# Patient Record
Sex: Female | Born: 1935
Health system: Southern US, Community
[De-identification: ages and names within clinical notes are randomized; demographics above are authoritative.]

## PROBLEM LIST (undated history)

## (undated) DIAGNOSIS — I10 Essential (primary) hypertension: Secondary | ICD-10-CM

## (undated) DIAGNOSIS — R0600 Dyspnea, unspecified: Secondary | ICD-10-CM

## (undated) DIAGNOSIS — I5032 Chronic diastolic (congestive) heart failure: Secondary | ICD-10-CM

## (undated) DIAGNOSIS — I4891 Unspecified atrial fibrillation: Secondary | ICD-10-CM

## (undated) DIAGNOSIS — I340 Nonrheumatic mitral (valve) insufficiency: Secondary | ICD-10-CM

## (undated) DIAGNOSIS — E7800 Pure hypercholesterolemia, unspecified: Secondary | ICD-10-CM

## (undated) DIAGNOSIS — I509 Heart failure, unspecified: Secondary | ICD-10-CM

## (undated) DIAGNOSIS — N189 Chronic kidney disease, unspecified: Secondary | ICD-10-CM

## (undated) DIAGNOSIS — M1990 Unspecified osteoarthritis, unspecified site: Secondary | ICD-10-CM

## (undated) HISTORY — DX: Nonrheumatic mitral (valve) insufficiency: I34.0

## (undated) HISTORY — DX: Heart failure, unspecified: I50.9

## (undated) HISTORY — DX: Chronic diastolic (congestive) heart failure: I50.32

## (undated) HISTORY — PX: CATARACT EXTRACTION, BILATERAL: SHX1313

---

## 1982-06-15 HISTORY — PX: BREAST BIOPSY: SHX20

## 1997-12-24 ENCOUNTER — Ambulatory Visit (HOSPITAL_COMMUNITY): Admission: RE | Admit: 1997-12-24 | Discharge: 1997-12-24 | Payer: Self-pay | Admitting: Endocrinology

## 1998-03-20 ENCOUNTER — Other Ambulatory Visit: Admission: RE | Admit: 1998-03-20 | Discharge: 1998-03-20 | Payer: Self-pay | Admitting: Endocrinology

## 1998-09-02 ENCOUNTER — Ambulatory Visit (HOSPITAL_COMMUNITY): Admission: RE | Admit: 1998-09-02 | Discharge: 1998-09-02 | Payer: Self-pay | Admitting: *Deleted

## 1999-03-25 ENCOUNTER — Other Ambulatory Visit: Admission: RE | Admit: 1999-03-25 | Discharge: 1999-03-25 | Payer: Self-pay | Admitting: Endocrinology

## 2000-03-11 ENCOUNTER — Encounter: Payer: Self-pay | Admitting: Endocrinology

## 2000-03-11 ENCOUNTER — Encounter: Admission: RE | Admit: 2000-03-11 | Discharge: 2000-03-11 | Payer: Self-pay | Admitting: Endocrinology

## 2000-04-01 ENCOUNTER — Other Ambulatory Visit: Admission: RE | Admit: 2000-04-01 | Discharge: 2000-04-01 | Payer: Self-pay | Admitting: Endocrinology

## 2001-03-14 ENCOUNTER — Encounter: Admission: RE | Admit: 2001-03-14 | Discharge: 2001-03-14 | Payer: Self-pay | Admitting: Endocrinology

## 2001-03-14 ENCOUNTER — Encounter: Payer: Self-pay | Admitting: Endocrinology

## 2001-04-05 ENCOUNTER — Other Ambulatory Visit: Admission: RE | Admit: 2001-04-05 | Discharge: 2001-04-05 | Payer: Self-pay | Admitting: Endocrinology

## 2002-03-14 ENCOUNTER — Encounter: Payer: Self-pay | Admitting: Endocrinology

## 2002-03-14 ENCOUNTER — Encounter: Admission: RE | Admit: 2002-03-14 | Discharge: 2002-03-14 | Payer: Self-pay | Admitting: Endocrinology

## 2003-03-15 ENCOUNTER — Encounter: Admission: RE | Admit: 2003-03-15 | Discharge: 2003-03-15 | Payer: Self-pay | Admitting: Endocrinology

## 2003-03-15 ENCOUNTER — Encounter: Payer: Self-pay | Admitting: Endocrinology

## 2003-08-12 ENCOUNTER — Emergency Department (HOSPITAL_COMMUNITY): Admission: EM | Admit: 2003-08-12 | Discharge: 2003-08-12 | Payer: Self-pay | Admitting: Emergency Medicine

## 2004-03-18 ENCOUNTER — Ambulatory Visit (HOSPITAL_COMMUNITY): Admission: RE | Admit: 2004-03-18 | Discharge: 2004-03-18 | Payer: Self-pay | Admitting: Endocrinology

## 2005-03-24 ENCOUNTER — Ambulatory Visit (HOSPITAL_COMMUNITY): Admission: RE | Admit: 2005-03-24 | Discharge: 2005-03-24 | Payer: Self-pay | Admitting: Endocrinology

## 2006-03-25 ENCOUNTER — Ambulatory Visit (HOSPITAL_COMMUNITY): Admission: RE | Admit: 2006-03-25 | Discharge: 2006-03-25 | Payer: Self-pay | Admitting: Endocrinology

## 2006-05-31 ENCOUNTER — Emergency Department (HOSPITAL_COMMUNITY): Admission: EM | Admit: 2006-05-31 | Discharge: 2006-05-31 | Payer: Self-pay | Admitting: Emergency Medicine

## 2006-07-12 ENCOUNTER — Emergency Department (HOSPITAL_COMMUNITY): Admission: EM | Admit: 2006-07-12 | Discharge: 2006-07-12 | Payer: Self-pay | Admitting: Emergency Medicine

## 2007-03-29 ENCOUNTER — Ambulatory Visit (HOSPITAL_COMMUNITY): Admission: RE | Admit: 2007-03-29 | Discharge: 2007-03-29 | Payer: Self-pay | Admitting: Endocrinology

## 2008-02-27 ENCOUNTER — Other Ambulatory Visit: Admission: RE | Admit: 2008-02-27 | Discharge: 2008-02-27 | Payer: Self-pay | Admitting: Endocrinology

## 2008-04-03 ENCOUNTER — Ambulatory Visit (HOSPITAL_COMMUNITY): Admission: RE | Admit: 2008-04-03 | Discharge: 2008-04-03 | Payer: Self-pay | Admitting: Endocrinology

## 2009-04-04 ENCOUNTER — Ambulatory Visit (HOSPITAL_COMMUNITY): Admission: RE | Admit: 2009-04-04 | Discharge: 2009-04-04 | Payer: Self-pay | Admitting: Endocrinology

## 2010-02-08 ENCOUNTER — Emergency Department (HOSPITAL_COMMUNITY): Admission: EM | Admit: 2010-02-08 | Discharge: 2010-02-08 | Payer: Self-pay | Admitting: Emergency Medicine

## 2010-04-09 ENCOUNTER — Ambulatory Visit (HOSPITAL_COMMUNITY): Admission: RE | Admit: 2010-04-09 | Discharge: 2010-04-09 | Payer: Self-pay | Admitting: Endocrinology

## 2011-03-12 ENCOUNTER — Other Ambulatory Visit (HOSPITAL_COMMUNITY): Payer: Self-pay | Admitting: Endocrinology

## 2011-03-12 DIAGNOSIS — Z1231 Encounter for screening mammogram for malignant neoplasm of breast: Secondary | ICD-10-CM

## 2011-04-14 ENCOUNTER — Ambulatory Visit (HOSPITAL_COMMUNITY)
Admission: RE | Admit: 2011-04-14 | Discharge: 2011-04-14 | Disposition: A | Discharge status: Home / Self Care | Payer: Medicare Other | Source: Ambulatory Visit | Attending: Endocrinology | Admitting: Endocrinology

## 2011-04-14 DIAGNOSIS — Z1231 Encounter for screening mammogram for malignant neoplasm of breast: Secondary | ICD-10-CM | POA: Insufficient documentation

## 2011-05-03 ENCOUNTER — Emergency Department (HOSPITAL_COMMUNITY): Payer: Medicare Other

## 2011-05-03 ENCOUNTER — Emergency Department (HOSPITAL_COMMUNITY)
Admission: EM | Admit: 2011-05-03 | Discharge: 2011-05-03 | Disposition: A | Discharge status: Home / Self Care | Payer: Medicare Other | Attending: Emergency Medicine | Admitting: Emergency Medicine

## 2011-05-03 DIAGNOSIS — M79609 Pain in unspecified limb: Secondary | ICD-10-CM | POA: Insufficient documentation

## 2011-05-03 DIAGNOSIS — M1990 Unspecified osteoarthritis, unspecified site: Secondary | ICD-10-CM

## 2011-05-03 DIAGNOSIS — M129 Arthropathy, unspecified: Secondary | ICD-10-CM | POA: Insufficient documentation

## 2011-05-03 DIAGNOSIS — I499 Cardiac arrhythmia, unspecified: Secondary | ICD-10-CM | POA: Insufficient documentation

## 2011-05-03 DIAGNOSIS — M1A00X1 Idiopathic chronic gout, unspecified site, with tophus (tophi): Secondary | ICD-10-CM | POA: Insufficient documentation

## 2011-05-03 DIAGNOSIS — M25539 Pain in unspecified wrist: Secondary | ICD-10-CM | POA: Insufficient documentation

## 2011-05-03 DIAGNOSIS — M25569 Pain in unspecified knee: Secondary | ICD-10-CM | POA: Insufficient documentation

## 2011-05-03 HISTORY — DX: Pure hypercholesterolemia, unspecified: E78.00

## 2011-05-03 HISTORY — DX: Unspecified osteoarthritis, unspecified site: M19.90

## 2011-05-03 MED ORDER — NAPROXEN 500 MG PO TABS: 500.0000 mg | ORAL_TABLET | Freq: Two times a day (BID) | ORAL | Status: AC

## 2011-05-03 MED ORDER — OXYCODONE-ACETAMINOPHEN 5-325 MG PO TABS: 1.0000 | ORAL_TABLET | Freq: Four times a day (QID) | ORAL | Status: AC | PRN

## 2011-05-03 MED ORDER — HYDROMORPHONE HCL PF 2 MG/ML IJ SOLN
2.0000 mg | Freq: Once | INTRAMUSCULAR | Status: AC
  Administered 2011-05-03: 2 mg via INTRAMUSCULAR
  Filled 2011-05-03: qty 1

## 2011-05-03 MED ORDER — NAPROXEN 500 MG PO TABS
500.0000 mg | ORAL_TABLET | Freq: Once | ORAL | Status: AC
  Administered 2011-05-03: 500 mg via ORAL
  Filled 2011-05-03: qty 1

## 2011-05-03 NOTE — ED Notes (Signed)
Patient is alert and oriented x3.  She has been given DC instructions and MD follow up instructions. V/S stable.  She is being DC to home with family via wheelchair.

## 2011-05-03 NOTE — ED Provider Notes (Signed)
History     CSN: [id number] Arrival date & time: 05/03/2011  9:54 AM   First MD Initiated Contact with Patient 05/03/11 1013      Chief Complaint  Patient presents with  . Wrist Pain    pt in with right wrist and hand pain also states pain in bilateral knee states hx of gout and arthritis pt states no relief with medications denies recent injury  . Knee Pain  . Hand Pain    (Consider location/radiation/quality/duration/timing/severity/associated sxs/prior treatment) HPI Comments: Chest bilateral knee pain and right hand pain which is gradually gotten worse over the past several days. Has been taking NSAIDs and culture seen at home without relief. History of gout. Range of motion is limited secondary to pain. 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 Having difficulty with ambulation secondary to pain  Patient is a 77 y.o. female presenting with knee pain. The history is provided by the patient. No language interpreter was used.  Knee Pain This is a chronic problem. Episode onset: several days ago. The problem occurs constantly. The problem has been gradually worsening. The symptoms are aggravated by walking and bending. The symptoms are relieved by nothing. Treatments tried: nsaids, colchicine. The treatment provided mild relief.    Past Medical History  Diagnosis Date  . Arthritis   . Gout   . Hypercholesteremia     History reviewed. No pertinent past surgical history.  No family history on file.  History  Substance Use Topics  . Smoking status: Never Smoker   . Smokeless tobacco: Not on file  . Alcohol Use: No    OB History    Grav Para Term Preterm Abortions TAB SAB Ect Mult Living                  Review of Systems  Constitutional: Negative for fever, activity change, appetite change and fatigue.  HENT: Negative for congestion, sore throat, facial swelling, rhinorrhea, neck pain and neck stiffness.   Respiratory: Negative for cough.   Cardiovascular:  Negative for palpitations.  Gastrointestinal: Negative for nausea and vomiting.  Genitourinary: Negative for dysuria, urgency, frequency and flank pain.  Musculoskeletal: Positive for joint swelling, arthralgias and gait problem. Negative for myalgias and back pain.  Neurological: Negative for dizziness, weakness, light-headedness and numbness.  All other systems reviewed and are negative.    Allergies  Zestoretic  Home Medications   Current Outpatient Rx  Name Route Sig Dispense Refill  . AMLODIPINE BESYLATE-VALSARTAN 10-320 MG PO TABS Oral Take 1 tablet by mouth daily.      . ASPIRIN 325 MG PO TBEC Oral Take 325 mg by mouth daily.      . COLCHICINE 0.6 MG PO TABS Oral Take 0.6 mg by mouth daily.      . FEBUXOSTAT 40 MG PO TABS Oral Take 80 mg by mouth every morning.      Marland Kitchen LATANOPROST 0.005 % OP SOLN  1 drop at bedtime.      Marland Kitchen METOPROLOL SUCCINATE 200 MG PO TB24 Oral Take 200 mg by mouth daily.      Marland Kitchen NAPROXEN SODIUM 220 MG PO TABS Oral Take 440 mg by mouth every 12 (twelve) hours as needed. For pain     . ROSUVASTATIN CALCIUM 20 MG PO TABS Oral Take 20 mg by mouth every evening.      . TORSEMIDE 20 MG PO TABS Oral Take 40 mg by mouth daily.      Marland Kitchen NAPROXEN 500 MG PO  TABS Oral Take 1 tablet (500 mg total) by mouth 2 (two) times daily. 30 tablet 0  . OXYCODONE-ACETAMINOPHEN 5-325 MG PO TABS Oral Take 1-2 tablets by mouth every 6 (six) hours as needed for pain. 20 tablet 0    BP 145/73  Pulse 60  Temp(Src) 98.4 F (36.9 C) (Oral)  Resp 18  SpO2 98%  Physical Exam  Nursing note and vitals reviewed. Constitutional: She appears well-developed and well-nourished. She appears distressed (in pain).  HENT:  Head: Normocephalic and atraumatic.  Mouth/Throat: Oropharynx is clear and moist.  Eyes: Conjunctivae and EOM are normal. Pupils are equal, round, and reactive to light.  Neck: Normal range of motion. Neck supple.  Cardiovascular: Normal rate, normal heart sounds and intact  distal pulses.  An irregularly irregular rhythm present. Exam reveals no gallop and no friction rub.   No murmur heard. Pulmonary/Chest: Effort normal and breath sounds normal. No respiratory distress.  Abdominal: Soft. Bowel sounds are normal. There is no tenderness.  Musculoskeletal: She exhibits tenderness.       Right knee: She exhibits decreased range of motion and bony tenderness. She exhibits no swelling and no erythema. tenderness found.       Left knee: She exhibits decreased range of motion and bony tenderness. She exhibits no swelling. tenderness found.       Right hand: She exhibits decreased range of motion, tenderness and bony tenderness.       Gouty tophi present    ED Course  Procedures (including critical care time)  Labs Reviewed - No data to display Dg Knee Complete 4 Views Left  05/03/2011  *RADIOLOGY REPORT*  Clinical Data: Knee pain  LEFT KNEE - COMPLETE 4+ VIEW  Comparison: None.  Findings: Trace suprapatellar fluid.  No fracture or dislocation. Moderate tricompartmental degenerative changes.  Vascular calcifications noted.  IMPRESSION: Moderate tricompartmental degenerative changes and trace suprapatellar fluid.  Original Report Authenticated By: Arline Asp, M.D.   Dg Knee Complete 4 Views Right  05/03/2011  *RADIOLOGY REPORT*  Clinical Data: Knee pain  RIGHT KNEE - COMPLETE 4+ VIEW  Comparison: None.  Findings: Moderate tricompartmental degenerative changes are noted. Trace suprapatellar fluid noted.  No fracture or dislocation. Vascular calcification.  IMPRESSION: Moderate tricompartmental degenerative change.  No acute finding.  Original Report Authenticated By: Arline Asp, M.D.     1. Arthritis       MDM  Patient with arthritis to her knees bilaterally. Increasing pain secondary to her history of chronic arthritis. I administered a dose of Dilaudid IM. She is able to ambulate with assistance. She is provided a walker at home for comfort. I will  discharge her home with Percocet and Naprosyn. Patient ambulated well I feel this point she is safe for home care. She's instructed to followup with her primary care physician this week        Trisha Mangle, MD 05/03/11 1349

## 2011-06-30 DIAGNOSIS — N390 Urinary tract infection, site not specified: Secondary | ICD-10-CM | POA: Diagnosis not present

## 2011-06-30 DIAGNOSIS — E789 Disorder of lipoprotein metabolism, unspecified: Secondary | ICD-10-CM | POA: Diagnosis not present

## 2011-06-30 DIAGNOSIS — I10 Essential (primary) hypertension: Secondary | ICD-10-CM | POA: Diagnosis not present

## 2011-07-07 DIAGNOSIS — I10 Essential (primary) hypertension: Secondary | ICD-10-CM | POA: Diagnosis not present

## 2011-07-07 DIAGNOSIS — M109 Gout, unspecified: Secondary | ICD-10-CM | POA: Diagnosis not present

## 2011-07-07 DIAGNOSIS — E789 Disorder of lipoprotein metabolism, unspecified: Secondary | ICD-10-CM | POA: Diagnosis not present

## 2011-08-03 DIAGNOSIS — H35039 Hypertensive retinopathy, unspecified eye: Secondary | ICD-10-CM | POA: Diagnosis not present

## 2011-08-03 DIAGNOSIS — H35049 Retinal micro-aneurysms, unspecified, unspecified eye: Secondary | ICD-10-CM | POA: Diagnosis not present

## 2011-08-11 DIAGNOSIS — R82998 Other abnormal findings in urine: Secondary | ICD-10-CM | POA: Diagnosis not present

## 2011-09-08 DIAGNOSIS — H26499 Other secondary cataract, unspecified eye: Secondary | ICD-10-CM | POA: Diagnosis not present

## 2011-09-08 DIAGNOSIS — H4011X0 Primary open-angle glaucoma, stage unspecified: Secondary | ICD-10-CM | POA: Diagnosis not present

## 2011-09-08 DIAGNOSIS — H409 Unspecified glaucoma: Secondary | ICD-10-CM | POA: Diagnosis not present

## 2011-09-08 DIAGNOSIS — Z961 Presence of intraocular lens: Secondary | ICD-10-CM | POA: Diagnosis not present

## 2011-09-29 DIAGNOSIS — I10 Essential (primary) hypertension: Secondary | ICD-10-CM | POA: Diagnosis not present

## 2011-09-29 DIAGNOSIS — M109 Gout, unspecified: Secondary | ICD-10-CM | POA: Diagnosis not present

## 2011-10-06 DIAGNOSIS — E789 Disorder of lipoprotein metabolism, unspecified: Secondary | ICD-10-CM | POA: Diagnosis not present

## 2011-10-06 DIAGNOSIS — I10 Essential (primary) hypertension: Secondary | ICD-10-CM | POA: Diagnosis not present

## 2011-10-06 DIAGNOSIS — M109 Gout, unspecified: Secondary | ICD-10-CM | POA: Diagnosis not present

## 2011-11-18 DIAGNOSIS — H409 Unspecified glaucoma: Secondary | ICD-10-CM | POA: Diagnosis not present

## 2011-11-18 DIAGNOSIS — H26499 Other secondary cataract, unspecified eye: Secondary | ICD-10-CM | POA: Diagnosis not present

## 2011-11-18 DIAGNOSIS — H4011X0 Primary open-angle glaucoma, stage unspecified: Secondary | ICD-10-CM | POA: Diagnosis not present

## 2011-11-18 DIAGNOSIS — Z961 Presence of intraocular lens: Secondary | ICD-10-CM | POA: Diagnosis not present

## 2011-11-24 DIAGNOSIS — H4011X0 Primary open-angle glaucoma, stage unspecified: Secondary | ICD-10-CM | POA: Diagnosis not present

## 2011-12-29 DIAGNOSIS — M109 Gout, unspecified: Secondary | ICD-10-CM | POA: Diagnosis not present

## 2011-12-29 DIAGNOSIS — E789 Disorder of lipoprotein metabolism, unspecified: Secondary | ICD-10-CM | POA: Diagnosis not present

## 2012-01-05 DIAGNOSIS — M109 Gout, unspecified: Secondary | ICD-10-CM | POA: Diagnosis not present

## 2012-01-05 DIAGNOSIS — I10 Essential (primary) hypertension: Secondary | ICD-10-CM | POA: Diagnosis not present

## 2012-03-22 ENCOUNTER — Other Ambulatory Visit (HOSPITAL_COMMUNITY): Payer: Self-pay | Admitting: Endocrinology

## 2012-03-22 DIAGNOSIS — Z1231 Encounter for screening mammogram for malignant neoplasm of breast: Secondary | ICD-10-CM

## 2012-04-08 DIAGNOSIS — Z23 Encounter for immunization: Secondary | ICD-10-CM | POA: Diagnosis not present

## 2012-04-14 DIAGNOSIS — Z1231 Encounter for screening mammogram for malignant neoplasm of breast: Secondary | ICD-10-CM | POA: Diagnosis not present

## 2012-04-18 DIAGNOSIS — M109 Gout, unspecified: Secondary | ICD-10-CM | POA: Diagnosis not present

## 2012-04-18 DIAGNOSIS — R0602 Shortness of breath: Secondary | ICD-10-CM | POA: Diagnosis not present

## 2012-04-18 DIAGNOSIS — I509 Heart failure, unspecified: Secondary | ICD-10-CM | POA: Diagnosis not present

## 2012-04-18 DIAGNOSIS — I10 Essential (primary) hypertension: Secondary | ICD-10-CM | POA: Diagnosis not present

## 2012-04-18 DIAGNOSIS — R0609 Other forms of dyspnea: Secondary | ICD-10-CM | POA: Diagnosis not present

## 2012-04-20 ENCOUNTER — Ambulatory Visit (HOSPITAL_COMMUNITY)
Admission: RE | Admit: 2012-04-20 | Discharge: 2012-04-20 | Disposition: A | Discharge status: Home / Self Care | Payer: Medicare Other | Source: Ambulatory Visit | Attending: Endocrinology | Admitting: Endocrinology

## 2012-04-20 DIAGNOSIS — Z1231 Encounter for screening mammogram for malignant neoplasm of breast: Secondary | ICD-10-CM | POA: Diagnosis not present

## 2012-04-22 DIAGNOSIS — I509 Heart failure, unspecified: Secondary | ICD-10-CM | POA: Diagnosis not present

## 2012-04-25 DIAGNOSIS — I10 Essential (primary) hypertension: Secondary | ICD-10-CM | POA: Diagnosis not present

## 2012-04-25 DIAGNOSIS — E785 Hyperlipidemia, unspecified: Secondary | ICD-10-CM | POA: Diagnosis not present

## 2012-04-25 DIAGNOSIS — R0602 Shortness of breath: Secondary | ICD-10-CM | POA: Diagnosis not present

## 2012-04-25 DIAGNOSIS — I4891 Unspecified atrial fibrillation: Secondary | ICD-10-CM | POA: Diagnosis not present

## 2012-05-11 DIAGNOSIS — R0602 Shortness of breath: Secondary | ICD-10-CM | POA: Diagnosis not present

## 2012-05-11 DIAGNOSIS — I4891 Unspecified atrial fibrillation: Secondary | ICD-10-CM | POA: Diagnosis not present

## 2012-05-11 DIAGNOSIS — I10 Essential (primary) hypertension: Secondary | ICD-10-CM | POA: Diagnosis not present

## 2012-05-24 DIAGNOSIS — H4011X0 Primary open-angle glaucoma, stage unspecified: Secondary | ICD-10-CM | POA: Diagnosis not present

## 2012-05-24 DIAGNOSIS — H26499 Other secondary cataract, unspecified eye: Secondary | ICD-10-CM | POA: Diagnosis not present

## 2012-05-27 DIAGNOSIS — I10 Essential (primary) hypertension: Secondary | ICD-10-CM | POA: Diagnosis not present

## 2012-05-27 DIAGNOSIS — I4891 Unspecified atrial fibrillation: Secondary | ICD-10-CM | POA: Diagnosis not present

## 2012-05-27 DIAGNOSIS — R0602 Shortness of breath: Secondary | ICD-10-CM | POA: Diagnosis not present

## 2012-06-03 DIAGNOSIS — I4891 Unspecified atrial fibrillation: Secondary | ICD-10-CM | POA: Diagnosis not present

## 2012-06-03 DIAGNOSIS — E785 Hyperlipidemia, unspecified: Secondary | ICD-10-CM | POA: Diagnosis not present

## 2012-06-03 DIAGNOSIS — I10 Essential (primary) hypertension: Secondary | ICD-10-CM | POA: Diagnosis not present

## 2012-06-03 DIAGNOSIS — R0602 Shortness of breath: Secondary | ICD-10-CM | POA: Diagnosis not present

## 2012-07-20 DIAGNOSIS — E789 Disorder of lipoprotein metabolism, unspecified: Secondary | ICD-10-CM | POA: Diagnosis not present

## 2012-07-20 DIAGNOSIS — I10 Essential (primary) hypertension: Secondary | ICD-10-CM | POA: Diagnosis not present

## 2012-07-27 DIAGNOSIS — E789 Disorder of lipoprotein metabolism, unspecified: Secondary | ICD-10-CM | POA: Diagnosis not present

## 2012-07-27 DIAGNOSIS — I10 Essential (primary) hypertension: Secondary | ICD-10-CM | POA: Diagnosis not present

## 2012-07-27 DIAGNOSIS — I2510 Atherosclerotic heart disease of native coronary artery without angina pectoris: Secondary | ICD-10-CM | POA: Diagnosis not present

## 2012-08-02 DIAGNOSIS — H35039 Hypertensive retinopathy, unspecified eye: Secondary | ICD-10-CM | POA: Diagnosis not present

## 2012-08-02 DIAGNOSIS — H35049 Retinal micro-aneurysms, unspecified, unspecified eye: Secondary | ICD-10-CM | POA: Diagnosis not present

## 2012-08-10 DIAGNOSIS — Z961 Presence of intraocular lens: Secondary | ICD-10-CM | POA: Diagnosis not present

## 2012-08-10 DIAGNOSIS — H4310 Vitreous hemorrhage, unspecified eye: Secondary | ICD-10-CM | POA: Diagnosis not present

## 2012-08-10 DIAGNOSIS — H409 Unspecified glaucoma: Secondary | ICD-10-CM | POA: Diagnosis not present

## 2012-08-10 DIAGNOSIS — H4011X0 Primary open-angle glaucoma, stage unspecified: Secondary | ICD-10-CM | POA: Diagnosis not present

## 2012-09-14 DIAGNOSIS — R7309 Other abnormal glucose: Secondary | ICD-10-CM | POA: Diagnosis not present

## 2012-09-14 DIAGNOSIS — I10 Essential (primary) hypertension: Secondary | ICD-10-CM | POA: Diagnosis not present

## 2012-09-14 DIAGNOSIS — E789 Disorder of lipoprotein metabolism, unspecified: Secondary | ICD-10-CM | POA: Diagnosis not present

## 2012-09-21 DIAGNOSIS — I4891 Unspecified atrial fibrillation: Secondary | ICD-10-CM | POA: Diagnosis not present

## 2012-09-21 DIAGNOSIS — M109 Gout, unspecified: Secondary | ICD-10-CM | POA: Diagnosis not present

## 2012-09-21 DIAGNOSIS — I10 Essential (primary) hypertension: Secondary | ICD-10-CM | POA: Diagnosis not present

## 2012-09-21 DIAGNOSIS — E789 Disorder of lipoprotein metabolism, unspecified: Secondary | ICD-10-CM | POA: Diagnosis not present

## 2012-12-07 DIAGNOSIS — I10 Essential (primary) hypertension: Secondary | ICD-10-CM | POA: Diagnosis not present

## 2012-12-07 DIAGNOSIS — I4891 Unspecified atrial fibrillation: Secondary | ICD-10-CM | POA: Diagnosis not present

## 2012-12-07 DIAGNOSIS — I498 Other specified cardiac arrhythmias: Secondary | ICD-10-CM | POA: Diagnosis not present

## 2012-12-07 DIAGNOSIS — R0602 Shortness of breath: Secondary | ICD-10-CM | POA: Diagnosis not present

## 2012-12-13 DIAGNOSIS — Z961 Presence of intraocular lens: Secondary | ICD-10-CM | POA: Diagnosis not present

## 2012-12-13 DIAGNOSIS — H4011X0 Primary open-angle glaucoma, stage unspecified: Secondary | ICD-10-CM | POA: Diagnosis not present

## 2012-12-13 DIAGNOSIS — H43819 Vitreous degeneration, unspecified eye: Secondary | ICD-10-CM | POA: Diagnosis not present

## 2012-12-13 DIAGNOSIS — H26499 Other secondary cataract, unspecified eye: Secondary | ICD-10-CM | POA: Diagnosis not present

## 2012-12-14 DIAGNOSIS — E789 Disorder of lipoprotein metabolism, unspecified: Secondary | ICD-10-CM | POA: Diagnosis not present

## 2012-12-14 DIAGNOSIS — Z79899 Other long term (current) drug therapy: Secondary | ICD-10-CM | POA: Diagnosis not present

## 2012-12-14 DIAGNOSIS — N390 Urinary tract infection, site not specified: Secondary | ICD-10-CM | POA: Diagnosis not present

## 2012-12-14 DIAGNOSIS — I10 Essential (primary) hypertension: Secondary | ICD-10-CM | POA: Diagnosis not present

## 2012-12-14 DIAGNOSIS — E739 Lactose intolerance, unspecified: Secondary | ICD-10-CM | POA: Diagnosis not present

## 2012-12-20 DIAGNOSIS — E789 Disorder of lipoprotein metabolism, unspecified: Secondary | ICD-10-CM | POA: Diagnosis not present

## 2012-12-20 DIAGNOSIS — I10 Essential (primary) hypertension: Secondary | ICD-10-CM | POA: Diagnosis not present

## 2012-12-20 DIAGNOSIS — I4891 Unspecified atrial fibrillation: Secondary | ICD-10-CM | POA: Diagnosis not present

## 2012-12-20 DIAGNOSIS — N390 Urinary tract infection, site not specified: Secondary | ICD-10-CM | POA: Diagnosis not present

## 2013-02-08 DIAGNOSIS — R0602 Shortness of breath: Secondary | ICD-10-CM | POA: Diagnosis not present

## 2013-02-08 DIAGNOSIS — I10 Essential (primary) hypertension: Secondary | ICD-10-CM | POA: Diagnosis not present

## 2013-02-08 DIAGNOSIS — I4891 Unspecified atrial fibrillation: Secondary | ICD-10-CM | POA: Diagnosis not present

## 2013-02-08 DIAGNOSIS — I498 Other specified cardiac arrhythmias: Secondary | ICD-10-CM | POA: Diagnosis not present

## 2013-03-03 DIAGNOSIS — Z23 Encounter for immunization: Secondary | ICD-10-CM | POA: Diagnosis not present

## 2013-03-15 DIAGNOSIS — I498 Other specified cardiac arrhythmias: Secondary | ICD-10-CM | POA: Diagnosis not present

## 2013-03-15 DIAGNOSIS — I4891 Unspecified atrial fibrillation: Secondary | ICD-10-CM | POA: Diagnosis not present

## 2013-03-15 DIAGNOSIS — E785 Hyperlipidemia, unspecified: Secondary | ICD-10-CM | POA: Diagnosis not present

## 2013-03-21 DIAGNOSIS — M109 Gout, unspecified: Secondary | ICD-10-CM | POA: Diagnosis not present

## 2013-03-21 DIAGNOSIS — E789 Disorder of lipoprotein metabolism, unspecified: Secondary | ICD-10-CM | POA: Diagnosis not present

## 2013-03-22 ENCOUNTER — Other Ambulatory Visit (HOSPITAL_COMMUNITY): Payer: Self-pay | Admitting: Endocrinology

## 2013-03-22 DIAGNOSIS — Z1231 Encounter for screening mammogram for malignant neoplasm of breast: Secondary | ICD-10-CM

## 2013-03-28 DIAGNOSIS — I10 Essential (primary) hypertension: Secondary | ICD-10-CM | POA: Diagnosis not present

## 2013-03-28 DIAGNOSIS — M1A00X1 Idiopathic chronic gout, unspecified site, with tophus (tophi): Secondary | ICD-10-CM | POA: Diagnosis not present

## 2013-03-28 DIAGNOSIS — E789 Disorder of lipoprotein metabolism, unspecified: Secondary | ICD-10-CM | POA: Diagnosis not present

## 2013-03-28 DIAGNOSIS — I4891 Unspecified atrial fibrillation: Secondary | ICD-10-CM | POA: Diagnosis not present

## 2013-04-19 DIAGNOSIS — Z961 Presence of intraocular lens: Secondary | ICD-10-CM | POA: Diagnosis not present

## 2013-04-19 DIAGNOSIS — H26499 Other secondary cataract, unspecified eye: Secondary | ICD-10-CM | POA: Diagnosis not present

## 2013-04-19 DIAGNOSIS — H4011X0 Primary open-angle glaucoma, stage unspecified: Secondary | ICD-10-CM | POA: Diagnosis not present

## 2013-04-26 ENCOUNTER — Ambulatory Visit (HOSPITAL_COMMUNITY)
Admission: RE | Admit: 2013-04-26 | Discharge: 2013-04-26 | Disposition: A | Discharge status: Home / Self Care | Payer: Medicare Other | Source: Ambulatory Visit | Attending: Endocrinology | Admitting: Endocrinology

## 2013-04-26 DIAGNOSIS — Z1231 Encounter for screening mammogram for malignant neoplasm of breast: Secondary | ICD-10-CM

## 2013-04-26 DIAGNOSIS — H26499 Other secondary cataract, unspecified eye: Secondary | ICD-10-CM | POA: Diagnosis not present

## 2013-05-17 DIAGNOSIS — I4891 Unspecified atrial fibrillation: Secondary | ICD-10-CM | POA: Diagnosis not present

## 2013-05-17 DIAGNOSIS — E785 Hyperlipidemia, unspecified: Secondary | ICD-10-CM | POA: Diagnosis not present

## 2013-05-17 DIAGNOSIS — I10 Essential (primary) hypertension: Secondary | ICD-10-CM | POA: Diagnosis not present

## 2013-05-17 DIAGNOSIS — I129 Hypertensive chronic kidney disease with stage 1 through stage 4 chronic kidney disease, or unspecified chronic kidney disease: Secondary | ICD-10-CM | POA: Diagnosis not present

## 2013-06-21 DIAGNOSIS — E789 Disorder of lipoprotein metabolism, unspecified: Secondary | ICD-10-CM | POA: Diagnosis not present

## 2013-07-03 DIAGNOSIS — M109 Gout, unspecified: Secondary | ICD-10-CM | POA: Diagnosis not present

## 2013-07-03 DIAGNOSIS — E789 Disorder of lipoprotein metabolism, unspecified: Secondary | ICD-10-CM | POA: Diagnosis not present

## 2013-07-03 DIAGNOSIS — I10 Essential (primary) hypertension: Secondary | ICD-10-CM | POA: Diagnosis not present

## 2013-08-16 DIAGNOSIS — Z961 Presence of intraocular lens: Secondary | ICD-10-CM | POA: Diagnosis not present

## 2013-08-16 DIAGNOSIS — H4011X0 Primary open-angle glaucoma, stage unspecified: Secondary | ICD-10-CM | POA: Diagnosis not present

## 2013-08-22 DIAGNOSIS — H35039 Hypertensive retinopathy, unspecified eye: Secondary | ICD-10-CM | POA: Diagnosis not present

## 2013-08-22 DIAGNOSIS — H35049 Retinal micro-aneurysms, unspecified, unspecified eye: Secondary | ICD-10-CM | POA: Diagnosis not present

## 2013-09-26 DIAGNOSIS — I10 Essential (primary) hypertension: Secondary | ICD-10-CM | POA: Diagnosis not present

## 2013-10-03 DIAGNOSIS — M109 Gout, unspecified: Secondary | ICD-10-CM | POA: Diagnosis not present

## 2013-10-03 DIAGNOSIS — I10 Essential (primary) hypertension: Secondary | ICD-10-CM | POA: Diagnosis not present

## 2013-10-03 DIAGNOSIS — E789 Disorder of lipoprotein metabolism, unspecified: Secondary | ICD-10-CM | POA: Diagnosis not present

## 2013-11-29 DIAGNOSIS — I10 Essential (primary) hypertension: Secondary | ICD-10-CM | POA: Diagnosis not present

## 2013-11-29 DIAGNOSIS — I4891 Unspecified atrial fibrillation: Secondary | ICD-10-CM | POA: Diagnosis not present

## 2013-11-29 DIAGNOSIS — R0602 Shortness of breath: Secondary | ICD-10-CM | POA: Diagnosis not present

## 2013-11-29 DIAGNOSIS — I498 Other specified cardiac arrhythmias: Secondary | ICD-10-CM | POA: Diagnosis not present

## 2013-12-19 DIAGNOSIS — Z79899 Other long term (current) drug therapy: Secondary | ICD-10-CM | POA: Diagnosis not present

## 2013-12-19 DIAGNOSIS — I10 Essential (primary) hypertension: Secondary | ICD-10-CM | POA: Diagnosis not present

## 2013-12-19 DIAGNOSIS — M109 Gout, unspecified: Secondary | ICD-10-CM | POA: Diagnosis not present

## 2013-12-19 DIAGNOSIS — R5381 Other malaise: Secondary | ICD-10-CM | POA: Diagnosis not present

## 2013-12-19 DIAGNOSIS — R5383 Other fatigue: Secondary | ICD-10-CM | POA: Diagnosis not present

## 2013-12-26 DIAGNOSIS — E789 Disorder of lipoprotein metabolism, unspecified: Secondary | ICD-10-CM | POA: Diagnosis not present

## 2013-12-26 DIAGNOSIS — M109 Gout, unspecified: Secondary | ICD-10-CM | POA: Diagnosis not present

## 2013-12-26 DIAGNOSIS — I499 Cardiac arrhythmia, unspecified: Secondary | ICD-10-CM | POA: Diagnosis not present

## 2013-12-26 DIAGNOSIS — I10 Essential (primary) hypertension: Secondary | ICD-10-CM | POA: Diagnosis not present

## 2014-01-24 DIAGNOSIS — I498 Other specified cardiac arrhythmias: Secondary | ICD-10-CM | POA: Diagnosis not present

## 2014-01-24 DIAGNOSIS — I10 Essential (primary) hypertension: Secondary | ICD-10-CM | POA: Diagnosis not present

## 2014-01-24 DIAGNOSIS — I129 Hypertensive chronic kidney disease with stage 1 through stage 4 chronic kidney disease, or unspecified chronic kidney disease: Secondary | ICD-10-CM | POA: Diagnosis not present

## 2014-01-24 DIAGNOSIS — E785 Hyperlipidemia, unspecified: Secondary | ICD-10-CM | POA: Diagnosis not present

## 2014-02-21 DIAGNOSIS — H4011X0 Primary open-angle glaucoma, stage unspecified: Secondary | ICD-10-CM | POA: Diagnosis not present

## 2014-02-21 DIAGNOSIS — Z961 Presence of intraocular lens: Secondary | ICD-10-CM | POA: Diagnosis not present

## 2014-02-21 DIAGNOSIS — H26499 Other secondary cataract, unspecified eye: Secondary | ICD-10-CM | POA: Diagnosis not present

## 2014-03-02 DIAGNOSIS — Z23 Encounter for immunization: Secondary | ICD-10-CM | POA: Diagnosis not present

## 2014-03-21 DIAGNOSIS — E789 Disorder of lipoprotein metabolism, unspecified: Secondary | ICD-10-CM | POA: Diagnosis not present

## 2014-03-21 DIAGNOSIS — I10 Essential (primary) hypertension: Secondary | ICD-10-CM | POA: Diagnosis not present

## 2014-03-21 DIAGNOSIS — N390 Urinary tract infection, site not specified: Secondary | ICD-10-CM | POA: Diagnosis not present

## 2014-03-28 DIAGNOSIS — I10 Essential (primary) hypertension: Secondary | ICD-10-CM | POA: Diagnosis not present

## 2014-03-28 DIAGNOSIS — R826 Abnormal urine levels of substances chiefly nonmedicinal as to source: Secondary | ICD-10-CM | POA: Diagnosis not present

## 2014-03-28 DIAGNOSIS — E790 Hyperuricemia without signs of inflammatory arthritis and tophaceous disease: Secondary | ICD-10-CM | POA: Diagnosis not present

## 2014-03-28 DIAGNOSIS — N182 Chronic kidney disease, stage 2 (mild): Secondary | ICD-10-CM | POA: Diagnosis not present

## 2014-04-04 ENCOUNTER — Other Ambulatory Visit (HOSPITAL_COMMUNITY): Payer: Self-pay | Admitting: Endocrinology

## 2014-04-04 DIAGNOSIS — Z1231 Encounter for screening mammogram for malignant neoplasm of breast: Secondary | ICD-10-CM

## 2014-05-01 ENCOUNTER — Ambulatory Visit (HOSPITAL_COMMUNITY)
Admission: RE | Admit: 2014-05-01 | Discharge: 2014-05-01 | Disposition: A | Discharge status: Home / Self Care | Payer: Medicare Other | Source: Ambulatory Visit | Attending: Endocrinology | Admitting: Endocrinology

## 2014-05-01 DIAGNOSIS — Z1231 Encounter for screening mammogram for malignant neoplasm of breast: Secondary | ICD-10-CM | POA: Diagnosis not present

## 2014-05-02 ENCOUNTER — Ambulatory Visit (HOSPITAL_COMMUNITY): Payer: Medicare Other

## 2014-05-02 DIAGNOSIS — I10 Essential (primary) hypertension: Secondary | ICD-10-CM | POA: Diagnosis not present

## 2014-05-02 DIAGNOSIS — N183 Chronic kidney disease, stage 3 (moderate): Secondary | ICD-10-CM | POA: Diagnosis not present

## 2014-05-02 DIAGNOSIS — I129 Hypertensive chronic kidney disease with stage 1 through stage 4 chronic kidney disease, or unspecified chronic kidney disease: Secondary | ICD-10-CM | POA: Diagnosis not present

## 2014-06-11 DIAGNOSIS — I129 Hypertensive chronic kidney disease with stage 1 through stage 4 chronic kidney disease, or unspecified chronic kidney disease: Secondary | ICD-10-CM | POA: Diagnosis not present

## 2014-06-13 ENCOUNTER — Other Ambulatory Visit: Payer: Self-pay | Admitting: Cardiology

## 2014-06-13 DIAGNOSIS — I129 Hypertensive chronic kidney disease with stage 1 through stage 4 chronic kidney disease, or unspecified chronic kidney disease: Secondary | ICD-10-CM

## 2014-06-13 DIAGNOSIS — N183 Chronic kidney disease, stage 3 unspecified: Secondary | ICD-10-CM

## 2014-06-13 DIAGNOSIS — E668 Other obesity: Secondary | ICD-10-CM | POA: Diagnosis not present

## 2014-06-13 DIAGNOSIS — I10 Essential (primary) hypertension: Secondary | ICD-10-CM | POA: Diagnosis not present

## 2014-06-20 DIAGNOSIS — E790 Hyperuricemia without signs of inflammatory arthritis and tophaceous disease: Secondary | ICD-10-CM | POA: Diagnosis not present

## 2014-06-20 DIAGNOSIS — E118 Type 2 diabetes mellitus with unspecified complications: Secondary | ICD-10-CM | POA: Diagnosis not present

## 2014-06-25 ENCOUNTER — Ambulatory Visit
Admission: RE | Admit: 2014-06-25 | Discharge: 2014-06-25 | Disposition: A | Discharge status: Home / Self Care | Payer: Medicare Other | Source: Ambulatory Visit | Attending: Cardiology | Admitting: Cardiology

## 2014-06-25 DIAGNOSIS — N183 Chronic kidney disease, stage 3 unspecified: Secondary | ICD-10-CM

## 2014-06-25 DIAGNOSIS — I10 Essential (primary) hypertension: Secondary | ICD-10-CM | POA: Diagnosis not present

## 2014-06-25 DIAGNOSIS — I129 Hypertensive chronic kidney disease with stage 1 through stage 4 chronic kidney disease, or unspecified chronic kidney disease: Secondary | ICD-10-CM

## 2014-06-25 DIAGNOSIS — N281 Cyst of kidney, acquired: Secondary | ICD-10-CM | POA: Diagnosis not present

## 2014-06-27 DIAGNOSIS — I10 Essential (primary) hypertension: Secondary | ICD-10-CM | POA: Diagnosis not present

## 2014-06-27 DIAGNOSIS — E875 Hyperkalemia: Secondary | ICD-10-CM | POA: Diagnosis not present

## 2014-06-27 DIAGNOSIS — E790 Hyperuricemia without signs of inflammatory arthritis and tophaceous disease: Secondary | ICD-10-CM | POA: Diagnosis not present

## 2014-07-04 DIAGNOSIS — E875 Hyperkalemia: Secondary | ICD-10-CM | POA: Diagnosis not present

## 2014-07-09 DIAGNOSIS — I701 Atherosclerosis of renal artery: Secondary | ICD-10-CM | POA: Diagnosis not present

## 2014-07-09 DIAGNOSIS — N183 Chronic kidney disease, stage 3 (moderate): Secondary | ICD-10-CM | POA: Diagnosis not present

## 2014-07-09 DIAGNOSIS — E780 Pure hypercholesterolemia: Secondary | ICD-10-CM | POA: Diagnosis not present

## 2014-07-09 DIAGNOSIS — I129 Hypertensive chronic kidney disease with stage 1 through stage 4 chronic kidney disease, or unspecified chronic kidney disease: Secondary | ICD-10-CM | POA: Diagnosis not present

## 2014-07-25 DIAGNOSIS — R001 Bradycardia, unspecified: Secondary | ICD-10-CM | POA: Diagnosis not present

## 2014-07-25 DIAGNOSIS — I701 Atherosclerosis of renal artery: Secondary | ICD-10-CM | POA: Diagnosis not present

## 2014-07-25 DIAGNOSIS — I120 Hypertensive chronic kidney disease with stage 5 chronic kidney disease or end stage renal disease: Secondary | ICD-10-CM | POA: Diagnosis not present

## 2014-07-30 ENCOUNTER — Encounter (HOSPITAL_COMMUNITY): Payer: Self-pay | Admitting: General Practice

## 2014-07-30 ENCOUNTER — Inpatient Hospital Stay (HOSPITAL_COMMUNITY)
Admission: RE | Admit: 2014-07-30 | Discharge: 2014-07-31 | DRG: 684 | Disposition: A | Discharge status: Home / Self Care | Payer: Medicare Other | Source: Ambulatory Visit | Attending: Cardiology | Admitting: Cardiology

## 2014-07-30 DIAGNOSIS — M1990 Unspecified osteoarthritis, unspecified site: Secondary | ICD-10-CM | POA: Diagnosis present

## 2014-07-30 DIAGNOSIS — I701 Atherosclerosis of renal artery: Secondary | ICD-10-CM | POA: Diagnosis present

## 2014-07-30 DIAGNOSIS — Z79899 Other long term (current) drug therapy: Secondary | ICD-10-CM

## 2014-07-30 DIAGNOSIS — I150 Renovascular hypertension: Secondary | ICD-10-CM | POA: Diagnosis present

## 2014-07-30 DIAGNOSIS — N183 Chronic kidney disease, stage 3 (moderate): Secondary | ICD-10-CM | POA: Diagnosis present

## 2014-07-30 DIAGNOSIS — Z7982 Long term (current) use of aspirin: Secondary | ICD-10-CM | POA: Diagnosis not present

## 2014-07-30 DIAGNOSIS — E785 Hyperlipidemia, unspecified: Secondary | ICD-10-CM | POA: Diagnosis present

## 2014-07-30 DIAGNOSIS — E780 Pure hypercholesterolemia: Secondary | ICD-10-CM | POA: Diagnosis present

## 2014-07-30 DIAGNOSIS — I129 Hypertensive chronic kidney disease with stage 1 through stage 4 chronic kidney disease, or unspecified chronic kidney disease: Secondary | ICD-10-CM | POA: Diagnosis not present

## 2014-07-30 HISTORY — DX: Essential (primary) hypertension: I10

## 2014-07-30 MED ORDER — METOPROLOL SUCCINATE ER 100 MG PO TB24
200.0000 mg | ORAL_TABLET | Freq: Every day | ORAL | Status: DC
  Administered 2014-07-30 – 2014-07-31 (×2): 200 mg via ORAL
  Filled 2014-07-30 (×2): qty 2

## 2014-07-30 MED ORDER — HEPARIN SODIUM (PORCINE) 5000 UNIT/ML IJ SOLN
5000.0000 [IU] | Freq: Three times a day (TID) | INTRAMUSCULAR | Status: DC
  Administered 2014-07-30 (×2): 5000 [IU] via SUBCUTANEOUS
  Filled 2014-07-30 (×5): qty 1

## 2014-07-30 MED ORDER — ASPIRIN EC 325 MG PO TBEC
325.0000 mg | DELAYED_RELEASE_TABLET | Freq: Every day | ORAL | Status: DC
  Administered 2014-07-30 – 2014-07-31 (×2): 325 mg via ORAL
  Filled 2014-07-30 (×2): qty 1

## 2014-07-30 MED ORDER — ROSUVASTATIN CALCIUM 20 MG PO TABS
20.0000 mg | ORAL_TABLET | Freq: Every evening | ORAL | Status: DC
  Administered 2014-07-30: 20 mg via ORAL
  Filled 2014-07-30 (×2): qty 1

## 2014-07-30 MED ORDER — SODIUM CHLORIDE 0.9 % IV SOLN
INTRAVENOUS | Status: DC
  Administered 2014-07-30 – 2014-07-31 (×3): via INTRAVENOUS

## 2014-07-30 MED ORDER — FEBUXOSTAT 40 MG PO TABS
80.0000 mg | ORAL_TABLET | ORAL | Status: DC
  Administered 2014-07-30 – 2014-07-31 (×2): 80 mg via ORAL
  Filled 2014-07-30 (×3): qty 2

## 2014-07-30 MED ORDER — AMLODIPINE BESYLATE 10 MG PO TABS
10.0000 mg | ORAL_TABLET | Freq: Every day | ORAL | Status: DC
  Administered 2014-07-30 – 2014-07-31 (×2): 10 mg via ORAL
  Filled 2014-07-30 (×2): qty 1

## 2014-07-30 MED ORDER — COLCHICINE 0.6 MG PO TABS
0.6000 mg | ORAL_TABLET | Freq: Every day | ORAL | Status: DC
  Administered 2014-07-30 – 2014-07-31 (×2): 0.6 mg via ORAL
  Filled 2014-07-30 (×2): qty 1

## 2014-07-30 MED ORDER — LATANOPROST 0.005 % OP SOLN
1.0000 [drp] | Freq: Every day | OPHTHALMIC | Status: DC
  Administered 2014-07-30: 1 [drp] via OPHTHALMIC
  Filled 2014-07-30 (×2): qty 2.5

## 2014-07-30 NOTE — H&P (Signed)
Shannon [name] is an 81 y.o. female.   Chief Complaint: Hypertension HPI: Shannon [name] is an 81 year old African-American female presenting for follow-up of hypertension. Her BP was elevated at the visit before last, but it was the first elevated reading so no medication changes were made at that time. She returned for follow up and blood pressure remained elevated she she she was started on Spironolactone, but on checking her BMP, S. Potassium was elevated and hence it was discontinued. She is on maximally tolerated doses of her current medications due to prior episode of bradycardia. She underwent renal u/s to evaluate for renal artery stenosis.  She continues to remain asymptomatic. She continues to take all her medications as prescribed. She does experience nighttime cramps, but she sleeps well, does not snore, and wakes up feeling refreshed. Denies chest pain, SOB, dizziness, increased edema, claudication, PND, and orthopnea. Now admitted for renal arteriogram tomorrow and for pre-hydration.  Past Medical History  Diagnosis Date  . Arthritis   . Gout   . Hypercholesteremia     No past surgical history on file.  No family history on file. Social History:  reports that she has never smoked. She does not have any smokeless tobacco history on file. She reports that she does not drink alcohol or use illicit drugs.  Allergies:  Allergies  Allergen Reactions  . Zestoretic [Lisinopril-Hydrochlorothiazide] Itching and Rash    Medications Prior to Admission  Medication Sig Dispense Refill  . amLODipine-valsartan (EXFORGE) 10-320 MG per tablet Take 1 tablet by mouth daily.      Marland Kitchen aspirin 325 MG EC tablet Take 325 mg by mouth daily.      . colchicine 0.6 MG tablet Take 0.6 mg by mouth daily.      . febuxostat (ULORIC) 40 MG tablet Take 80 mg by mouth every morning.      . latanoprost (XALATAN) 0.005 % ophthalmic solution 1 drop at bedtime.      . metoprolol (TOPROL-XL) 200 MG 24 hr tablet Take  200 mg by mouth daily.      . naproxen sodium (ANAPROX) 220 MG tablet Take 440 mg by mouth every 12 (twelve) hours as needed. For pain     . rosuvastatin (CRESTOR) 20 MG tablet Take 20 mg by mouth every evening.      . torsemide (DEMADEX) 20 MG tablet Take 40 mg by mouth daily.          Review of Systems - General:Present- Feeling well. Not Present- Fatigue, Fever and Night Sweats. Skin:Not Present- Itching and Rash. HEENT:Not Present- Headache and Blurred Vision. Respiratory:Not Present- Cough, Dyspnea and Snoring. Cardiovascular:Present- Elevated Blood Pressure and Night Cramps. Not Present- Chest Pain, Claudications, Edema, Orthopnea, Palpitations, Paroxysmal Nocturnal Dyspnea and Shortness of Breath. Gastrointestinal:Not Present- Abdominal Pain, Constipation, Diarrhea, Nausea and Vomiting. Musculoskeletal:Not Present- Joint Pain and Joint Swelling. Neurological:Not Present- Dizziness, Syncope and Weakness. Hematology:Not Present- Blood Clots, Easy Bruising and Nose Bleed.  Blood pressure 141/67, pulse 53, temperature 98.7 F (37.1 C), temperature source Oral, resp. rate 18, weight 107.7 kg (237 lb 7 oz), SpO2 97 %. General Mental Status- Alert. General Appearance- Cooperative and Appears stated age. Not in acute distress. Orientation- Oriented X3. Build & Nutrition- Moderately built and Moderately obese.   Chest and Lung Exam Palpation:Tender- No chest wall tenderness. Auscultation: Breath sounds:- Clear.   Cardiovascular Inspection:Jugular vein- Right- No Distention. Auscultation:Heart Sounds- S1 WNL, S2 WNL and No gallop present. Murmurs & Other Heart Sounds: Murmur:- No murmur.  Peripheral Vascular Lower Extremity: Palpation:Edema- Left- No edema. Right- No edema. Carotid arteries- Left- No Carotid bruit. Right- No Carotid bruit.  Neurologic Motor:- Grossly intact without any focal deficits.  Musculoskeletal Global  Assessment Left Lower Extremity - normal range of motion without pain. Right Lower Extremity- normal range of motion without pain.   EKG 01/24/2014: Sinus bradycardia at the rate of 50 bpm with first-degree AV block.  Normal axis.  Or R-wave progression.  No evidence of ischemia.  Normal QT interval.   Assessment/Plan Hypertensive kidney disease with chronic kidney disease stage III  Renal artery duplex- Radiology 06/25/2014: Portions of the right and left renal arteries were obscured by atherosclerotic calcifications. Systolic velocity measurement ratio to the aortic velocity at the origin of the right renal artery exceeds normal values and significant stenosis of the right renal artery is not excluded. CT angiogram or MRA angiogram is recommended. The origin of the left renal artery was not visualized therefore isn't completely characterized.  Labs 06/20/2014: BUN 60, creatinine 2.1, EGFR 29, potassium 5.2, CMP otherwise normal, HbA1c 6.4%  Labs 06/11/2014: Serum glucose 127, BUN 62, creatinine 1.90, eGFR 28, potassium 5.7  Labs 03/21/2014: Blood glucose 139 mg, BUN 34, serum creatinine 1.7 with eGFR 37.1 mL. CMP otherwise within normal limits.  Renal artery stenosis, native  Hyperlipidemia, group A. Labs 03/21/2014: HbA1c 6.5%. Total cholesterol 134, triglycerides 137, HDL 39, LDL 68.  Recommend:  With ACE inhibitor therapy, patient had developed acute renal failure and hyperkalemia and outpatient duplex of the renal arteries revealed suggestion of renal artery stenosis. Due to uncontrolled hypertension, I have recommended that he proceed with renal angiography. Patient is aware of less than 2-3% risk of renal failure, need for temporary dialysis or permanent dialysis, urgent surgical revascularization, leading, infection but not limited to these has major competition. I will admit the patient one day prior to the procedure for hydration. I will see her back after the procedure. Patient is  willing to proceed. I have advised the patient to start Mucomyst one day prior to the procedure. This is to reduce contrast nephropathy.  Shannon Page, MD 07/30/2014, 11:38 AM [facility] [place] Pager: [phone] Office: [phone] If no answer: Cell:  [phone]

## 2014-07-31 ENCOUNTER — Encounter (HOSPITAL_COMMUNITY): Payer: Self-pay | Admitting: Cardiology

## 2014-07-31 ENCOUNTER — Encounter (HOSPITAL_COMMUNITY)
Admission: RE | Disposition: A | Discharge status: Home / Self Care | Payer: Self-pay | Source: Ambulatory Visit | Attending: Cardiology

## 2014-07-31 DIAGNOSIS — I129 Hypertensive chronic kidney disease with stage 1 through stage 4 chronic kidney disease, or unspecified chronic kidney disease: Secondary | ICD-10-CM | POA: Diagnosis not present

## 2014-07-31 DIAGNOSIS — I150 Renovascular hypertension: Secondary | ICD-10-CM | POA: Diagnosis present

## 2014-07-31 DIAGNOSIS — I701 Atherosclerosis of renal artery: Secondary | ICD-10-CM | POA: Diagnosis not present

## 2014-07-31 HISTORY — PX: RENAL ANGIOGRAM: SHX5509

## 2014-07-31 LAB — BASIC METABOLIC PANEL
Anion gap: 9 (ref 5–15)
BUN: 21 mg/dL (ref 6–23)
CALCIUM: 8.4 mg/dL (ref 8.4–10.5)
CO2: 22 mmol/L (ref 19–32)
CREATININE: 1.24 mg/dL — AB (ref 0.50–1.10)
Chloride: 111 mmol/L (ref 96–112)
GFR calc Af Amer: 46 mL/min — ABNORMAL LOW (ref >90)
GFR, EST NON AFRICAN AMERICAN: 40 mL/min — AB (ref >90)
Glucose, Bld: 109 mg/dL — ABNORMAL HIGH (ref 70–99)
Potassium: 3.7 mmol/L (ref 3.5–5.1)
Sodium: 142 mmol/L (ref 135–145)

## 2014-07-31 LAB — CBC
HCT: 34.5 % — ABNORMAL LOW (ref 36.0–46.0)
Hemoglobin: 11.1 g/dL — ABNORMAL LOW (ref 12.0–15.0)
MCH: 30.5 pg (ref 26.0–34.0)
MCHC: 32.2 g/dL (ref 30.0–36.0)
MCV: 94.8 fL (ref 78.0–100.0)
PLATELETS: 179 10*3/uL (ref 150–400)
RBC: 3.64 MIL/uL — AB (ref 3.87–5.11)
RDW: 14 % (ref 11.5–15.5)
WBC: 4.3 10*3/uL (ref 4.0–10.5)

## 2014-07-31 LAB — POCT ACTIVATED CLOTTING TIME
ACTIVATED CLOTTING TIME: 177 s
ACTIVATED CLOTTING TIME: 239 s
Activated Clotting Time: 190 seconds

## 2014-07-31 LAB — PROTIME-INR
INR: 1.14 (ref 0.00–1.49)
Prothrombin Time: 14.7 seconds (ref 11.6–15.2)

## 2014-07-31 SURGERY — RENAL ANGIOGRAM
Anesthesia: LOCAL | Laterality: Bilateral

## 2014-07-31 MED ORDER — LIDOCAINE HCL (PF) 1 % IJ SOLN
INTRAMUSCULAR | Status: AC
  Filled 2014-07-31: qty 30

## 2014-07-31 MED ORDER — HEPARIN (PORCINE) IN NACL 2-0.9 UNIT/ML-% IJ SOLN
INTRAMUSCULAR | Status: AC
  Filled 2014-07-31: qty 500

## 2014-07-31 MED ORDER — SODIUM CHLORIDE 0.9 % IV SOLN: 1.0000 mL/kg/h | INTRAVENOUS | Status: DC

## 2014-07-31 MED ORDER — HEPARIN SODIUM (PORCINE) 1000 UNIT/ML IJ SOLN
INTRAMUSCULAR | Status: AC
  Filled 2014-07-31: qty 1

## 2014-07-31 MED ORDER — FENTANYL CITRATE 0.05 MG/ML IJ SOLN
INTRAMUSCULAR | Status: AC
  Filled 2014-07-31: qty 2

## 2014-07-31 MED ORDER — MIDAZOLAM HCL 2 MG/2ML IJ SOLN
INTRAMUSCULAR | Status: AC
Start: 2014-07-31 — End: 2014-07-31
  Filled 2014-07-31: qty 2

## 2014-07-31 MED ORDER — AMLODIPINE BESYLATE-VALSARTAN 10-320 MG PO TABS: 1.0000 | ORAL_TABLET | Freq: Every day | ORAL | Status: DC

## 2014-07-31 NOTE — Progress Notes (Signed)
Pt discharging via wheelchair, escorted by volunteer, accompanied by family. Discharge instructions provided to patient and family, verbalize understanding and able to provide appropriate teach back; emphasis on post cath care, surgical site infections, medications, and follow up. PIVs discontinued, sites without s/s of complication. Tele box removed and returned to unit. Denies needs or questions at this time.

## 2014-07-31 NOTE — CV Procedure (Addendum)
Abdominal aortogram, selective right and left renal arteriogram. Right femoral arteriogram and closure of the right femoral arterial access with Perclose  Indication: Patient is a 81 year old African-American female with difficult to control hypertension, development of acute renal failure on Spiranolactone inhibitor therapy, outpatient renal duplex had revealed possible right renal artery stenosis. With a high clinical suspicion for renal artery stenosis, patient was admitted on the previous day for hydration due to stage III chronic kidney disease and was brought to the peripheral angiography suite the following morning for angiography and possible antiplastic.  Angiographic data: Abdominal aorta: The abdominal aorta shows mild abdominal aortic ectasia, severe abdominal aortic tortuosity. Bilateral renal arteriogram: Right renal artery shows a 30-40% stenosis angiographically, high-grade stenosis cannot be excluded. Left renal artery showed a 30-40% stenosis, mild calcification evident, engagement of the left renal artery and pressure gradient measured, there was no pressure gradient. Hence stenosis felt to be not significant.  Right renal artery pressure gradients could not be measured in spite of multiple attempts due to aortic tortuosity.  Recommendation: Patient will be discharged home today, she will need brachial artery access to evaluate the significance of right renal artery stenosis versus CT angiogram to confirm the right renal artery stenosis. Suspect hemodynamic data with measurement of pressure gradient would be most appropriate given intermediate stenosis in the right lower artery. A total of 85 mL of contrast was utilized for diagnostic angiography.  Technique: Under sterile precautions using micropuncture technique, right femoral arterial access was obtained with a 5 French sheath in place, I utilized a Omni Flush catheter to perform abdominal aortogram and utilized a 5 [place] LIMA  catheter to engage the left renal artery and select left lower arthrogram was performed.  Right renal artery engagement was attempted via multiple catheters. A 6 [place] Double Renal guide catheter was utilized, I was barely able to engage the right renal artery. Using multiple diagnostic catheters including LIMA catheter, right coronary artery catheter, and eventually using a long SOS catheter, I was able to engage the right femoral artery, however I was not able to advance the guide catheter close to the right renal artery. I was unable to advance any catheters to measure the pressure gradient including a 4 French glide tip catheter. Due to prolonged procedure, procedure was abandoned. Right renal arteriogram was performed through the arterial access sheath and access closed with Perclose with excellent hemostasis. Patient tolerated the procedure. No immediate competitions. Patient did receive intravenous heparin, ACT was 239 seconds at the end of the procedure.

## 2014-07-31 NOTE — Interval H&P Note (Signed)
History and Physical Interval Note:  07/31/2014 7:45 AM  Shannon [name]  has presented today for surgery, with the diagnosis of c/p  The various methods of treatment have been discussed with the patient and family. After consideration of risks, benefits and other options for treatment, the patient has consented to  Procedure(s): RENAL ANGIOGRAM (N/A), abdominal angiogram, and possible PTA  as a surgical intervention .  The patient's history has been reviewed, patient examined, no change in status, stable for surgery.  I have reviewed the patient's chart and labs.  Questions were answered to the patient's satisfaction.     Laverda Page

## 2014-08-02 NOTE — Discharge Summary (Addendum)
Physician Discharge Summary  Patient ID: Shannon [name] MRN: [id number] DOB/AGE: [id number] 79 y.o.  Admit date: 07/30/2014 Discharge date: 07/31/2014  Primary Discharge Diagnosis Essential hypertension Renal atherosclerosis: renal arteriogram 07/31/2014 revealing widely patent left renal artery with 20-30% stenosis, right renal artery also appeared to have 30-40% stenosis, pressure gradient across the renal artery could not be measured due to aortic tortuosity.  However lesion did not appear to be high-grade. Secondary Discharge Diagnosis Hyperlipidemia Stage III chronic kidney disease due to hypertension  Significant Diagnostic Studies:  Renal arteriogram 07/31/2014 revealing widely patent left renal artery with 20-30% stenosis, right renal artery also appeared to have 30-40% stenosis, pressure gradient across the renal artery could not be measured due to aortic tortuosity.  However lesion did not appear to be high-grade.  Hospital Course: patient was admitted 1 day prior to the procedure for hydration due to stage III chronic kidney disease. She underwent renal arteriogram, did not he will high-grade stenosis, the impetus was on the right renal artery, however I could not engage the right renal artery to evaluate should gradient across the moderate stenosis that is evident angiographically.  Overall I did not believe that she had high-grade stenosis. Hence after the procedure she was felt stable for discharge with outpatient follow-up.  I suspect diet and her weight is significantly contributing to uncontrolled hypertension.  Recommendations on discharge: I had a long discussion with the patient's family including 3 of her daughters and discussed the changes that she needs to make with regard to the diet.  I'll see her back in the office  Discharge Exam: Blood pressure 127/60, pulse 55, temperature 98.3 F (36.8 C), temperature source Oral, resp. rate 18, height 5\' 6"  (1.676 m), weight 107.7 kg  (237 lb 7 oz), SpO2 95 %.  Mental Status- Alert. General Appearance- Cooperative and Appears stated age. Not in acute distress. Orientation- Oriented X3. Build & Nutrition- Moderately built and Moderately obese.   Chest and Lung Exam Palpation:Tender- No chest wall tenderness. Auscultation: Breath sounds:- Clear.   Cardiovascular Inspection:Jugular vein- Right- No Distention. Auscultation:Heart Sounds- S1 WNL, S2 WNL and No gallop present. Murmurs & Other Heart Sounds: Murmur:- No murmur.  Peripheral Vascular Lower Extremity: Palpation:Edema- Left- No edema. Right- No edema. Right femoral access without hematoma. Labs:   Lab Results  Component Value Date   WBC 4.3 07/31/2014   HGB 11.1* 07/31/2014   HCT 34.5* 07/31/2014   MCV 94.8 07/31/2014   PLT 179 07/31/2014    Recent Labs Lab 07/31/14 0530  NA 142  K 3.7  CL 111  CO2 22  BUN 21  CREATININE 1.24*  CALCIUM 8.4  GLUCOSE 109*    FOLLOW UP PLANS AND APPOINTMENTS    Medication List    STOP taking these medications        naproxen sodium 220 MG tablet  Commonly known as:  ANAPROX      TAKE these medications        amLODipine-valsartan 10-320 MG per tablet  Commonly known as:  EXFORGE  Take 1 tablet by mouth daily.     aspirin 325 MG EC tablet  Take 325 mg by mouth daily.     colchicine 0.6 MG tablet  Take 0.6 mg by mouth daily.     febuxostat 40 MG tablet  Commonly known as:  ULORIC  Take 80 mg by mouth every morning.     latanoprost 0.005 % ophthalmic solution  Commonly known as:  XALATAN  1 drop at  bedtime.     metoprolol 200 MG 24 hr tablet  Commonly known as:  TOPROL-XL  Take 200 mg by mouth daily.     rosuvastatin 20 MG tablet  Commonly known as:  CRESTOR  Take 20 mg by mouth every evening.     torsemide 20 MG tablet  Commonly known as:  DEMADEX  Take 40 mg by mouth daily.           Follow-up Information    Follow up with Laverda Page, MD.    Specialty:  Cardiology   Why:  Keep previous appointment   Contact information:   [address] [place] [address] [phone]        Laverda Page, MD 08/02/2014, 6:25 PM  Pager: [phone] Office: [phone] If no answer: [phone]

## 2014-08-08 DIAGNOSIS — N183 Chronic kidney disease, stage 3 (moderate): Secondary | ICD-10-CM | POA: Diagnosis not present

## 2014-08-08 DIAGNOSIS — I701 Atherosclerosis of renal artery: Secondary | ICD-10-CM | POA: Diagnosis not present

## 2014-08-08 DIAGNOSIS — I10 Essential (primary) hypertension: Secondary | ICD-10-CM | POA: Diagnosis not present

## 2014-08-08 DIAGNOSIS — I129 Hypertensive chronic kidney disease with stage 1 through stage 4 chronic kidney disease, or unspecified chronic kidney disease: Secondary | ICD-10-CM | POA: Diagnosis not present

## 2014-08-29 DIAGNOSIS — Z961 Presence of intraocular lens: Secondary | ICD-10-CM | POA: Diagnosis not present

## 2014-08-29 DIAGNOSIS — H4011X1 Primary open-angle glaucoma, mild stage: Secondary | ICD-10-CM | POA: Diagnosis not present

## 2014-09-19 DIAGNOSIS — E118 Type 2 diabetes mellitus with unspecified complications: Secondary | ICD-10-CM | POA: Diagnosis not present

## 2014-09-26 DIAGNOSIS — E789 Disorder of lipoprotein metabolism, unspecified: Secondary | ICD-10-CM | POA: Diagnosis not present

## 2014-09-26 DIAGNOSIS — E790 Hyperuricemia without signs of inflammatory arthritis and tophaceous disease: Secondary | ICD-10-CM | POA: Diagnosis not present

## 2014-09-26 DIAGNOSIS — I10 Essential (primary) hypertension: Secondary | ICD-10-CM | POA: Diagnosis not present

## 2014-10-10 DIAGNOSIS — Z961 Presence of intraocular lens: Secondary | ICD-10-CM | POA: Diagnosis not present

## 2014-10-10 DIAGNOSIS — H4011X1 Primary open-angle glaucoma, mild stage: Secondary | ICD-10-CM | POA: Diagnosis not present

## 2014-11-06 DIAGNOSIS — N183 Chronic kidney disease, stage 3 (moderate): Secondary | ICD-10-CM | POA: Diagnosis not present

## 2014-11-06 DIAGNOSIS — I129 Hypertensive chronic kidney disease with stage 1 through stage 4 chronic kidney disease, or unspecified chronic kidney disease: Secondary | ICD-10-CM | POA: Diagnosis not present

## 2014-11-06 DIAGNOSIS — R0602 Shortness of breath: Secondary | ICD-10-CM | POA: Diagnosis not present

## 2014-12-19 DIAGNOSIS — R0602 Shortness of breath: Secondary | ICD-10-CM | POA: Diagnosis not present

## 2014-12-19 DIAGNOSIS — N183 Chronic kidney disease, stage 3 (moderate): Secondary | ICD-10-CM | POA: Diagnosis not present

## 2014-12-19 DIAGNOSIS — I129 Hypertensive chronic kidney disease with stage 1 through stage 4 chronic kidney disease, or unspecified chronic kidney disease: Secondary | ICD-10-CM | POA: Diagnosis not present

## 2014-12-25 DIAGNOSIS — E118 Type 2 diabetes mellitus with unspecified complications: Secondary | ICD-10-CM | POA: Diagnosis not present

## 2014-12-25 DIAGNOSIS — E875 Hyperkalemia: Secondary | ICD-10-CM | POA: Diagnosis not present

## 2014-12-25 DIAGNOSIS — N390 Urinary tract infection, site not specified: Secondary | ICD-10-CM | POA: Diagnosis not present

## 2014-12-25 DIAGNOSIS — E789 Disorder of lipoprotein metabolism, unspecified: Secondary | ICD-10-CM | POA: Diagnosis not present

## 2014-12-25 DIAGNOSIS — Z0000 Encounter for general adult medical examination without abnormal findings: Secondary | ICD-10-CM | POA: Diagnosis not present

## 2014-12-25 DIAGNOSIS — I10 Essential (primary) hypertension: Secondary | ICD-10-CM | POA: Diagnosis not present

## 2015-01-01 DIAGNOSIS — E789 Disorder of lipoprotein metabolism, unspecified: Secondary | ICD-10-CM | POA: Diagnosis not present

## 2015-01-01 DIAGNOSIS — E790 Hyperuricemia without signs of inflammatory arthritis and tophaceous disease: Secondary | ICD-10-CM | POA: Diagnosis not present

## 2015-01-01 DIAGNOSIS — N390 Urinary tract infection, site not specified: Secondary | ICD-10-CM | POA: Diagnosis not present

## 2015-01-01 DIAGNOSIS — I10 Essential (primary) hypertension: Secondary | ICD-10-CM | POA: Diagnosis not present

## 2015-02-05 DIAGNOSIS — Z961 Presence of intraocular lens: Secondary | ICD-10-CM | POA: Diagnosis not present

## 2015-02-05 DIAGNOSIS — H4011X1 Primary open-angle glaucoma, mild stage: Secondary | ICD-10-CM | POA: Diagnosis not present

## 2015-03-27 DIAGNOSIS — R0602 Shortness of breath: Secondary | ICD-10-CM | POA: Diagnosis not present

## 2015-03-27 DIAGNOSIS — N183 Chronic kidney disease, stage 3 (moderate): Secondary | ICD-10-CM | POA: Diagnosis not present

## 2015-03-27 DIAGNOSIS — I129 Hypertensive chronic kidney disease with stage 1 through stage 4 chronic kidney disease, or unspecified chronic kidney disease: Secondary | ICD-10-CM | POA: Diagnosis not present

## 2015-03-29 DIAGNOSIS — Z23 Encounter for immunization: Secondary | ICD-10-CM | POA: Diagnosis not present

## 2015-04-16 ENCOUNTER — Other Ambulatory Visit: Payer: Self-pay

## 2015-04-16 DIAGNOSIS — Z1231 Encounter for screening mammogram for malignant neoplasm of breast: Secondary | ICD-10-CM

## 2015-04-18 DIAGNOSIS — E789 Disorder of lipoprotein metabolism, unspecified: Secondary | ICD-10-CM | POA: Diagnosis not present

## 2015-04-18 DIAGNOSIS — M25569 Pain in unspecified knee: Secondary | ICD-10-CM | POA: Diagnosis not present

## 2015-04-18 DIAGNOSIS — E790 Hyperuricemia without signs of inflammatory arthritis and tophaceous disease: Secondary | ICD-10-CM | POA: Diagnosis not present

## 2015-04-18 DIAGNOSIS — M1712 Unilateral primary osteoarthritis, left knee: Secondary | ICD-10-CM | POA: Diagnosis not present

## 2015-05-08 ENCOUNTER — Ambulatory Visit
Admission: RE | Admit: 2015-05-08 | Discharge: 2015-05-08 | Disposition: A | Discharge status: Home / Self Care | Payer: Medicare Other | Source: Ambulatory Visit

## 2015-05-08 DIAGNOSIS — Z1231 Encounter for screening mammogram for malignant neoplasm of breast: Secondary | ICD-10-CM

## 2015-06-11 DIAGNOSIS — M1712 Unilateral primary osteoarthritis, left knee: Secondary | ICD-10-CM | POA: Diagnosis not present

## 2015-06-25 DIAGNOSIS — E789 Disorder of lipoprotein metabolism, unspecified: Secondary | ICD-10-CM | POA: Diagnosis not present

## 2015-06-25 DIAGNOSIS — E118 Type 2 diabetes mellitus with unspecified complications: Secondary | ICD-10-CM | POA: Diagnosis not present

## 2015-07-02 DIAGNOSIS — E789 Disorder of lipoprotein metabolism, unspecified: Secondary | ICD-10-CM | POA: Diagnosis not present

## 2015-07-02 DIAGNOSIS — I10 Essential (primary) hypertension: Secondary | ICD-10-CM | POA: Diagnosis not present

## 2015-07-02 DIAGNOSIS — M25569 Pain in unspecified knee: Secondary | ICD-10-CM | POA: Diagnosis not present

## 2015-07-15 DIAGNOSIS — M1712 Unilateral primary osteoarthritis, left knee: Secondary | ICD-10-CM | POA: Diagnosis not present

## 2015-07-23 DIAGNOSIS — M1712 Unilateral primary osteoarthritis, left knee: Secondary | ICD-10-CM | POA: Diagnosis not present

## 2015-07-30 DIAGNOSIS — M1712 Unilateral primary osteoarthritis, left knee: Secondary | ICD-10-CM | POA: Diagnosis not present

## 2015-08-08 DIAGNOSIS — H401131 Primary open-angle glaucoma, bilateral, mild stage: Secondary | ICD-10-CM | POA: Diagnosis not present

## 2015-09-10 DIAGNOSIS — M1712 Unilateral primary osteoarthritis, left knee: Secondary | ICD-10-CM | POA: Diagnosis not present

## 2015-09-25 DIAGNOSIS — I129 Hypertensive chronic kidney disease with stage 1 through stage 4 chronic kidney disease, or unspecified chronic kidney disease: Secondary | ICD-10-CM | POA: Diagnosis not present

## 2015-09-25 DIAGNOSIS — Z01810 Encounter for preprocedural cardiovascular examination: Secondary | ICD-10-CM | POA: Diagnosis not present

## 2015-09-25 DIAGNOSIS — I10 Essential (primary) hypertension: Secondary | ICD-10-CM | POA: Diagnosis not present

## 2015-09-25 DIAGNOSIS — N183 Chronic kidney disease, stage 3 (moderate): Secondary | ICD-10-CM | POA: Diagnosis not present

## 2015-10-08 DIAGNOSIS — R0602 Shortness of breath: Secondary | ICD-10-CM | POA: Diagnosis not present

## 2015-10-08 DIAGNOSIS — R609 Edema, unspecified: Secondary | ICD-10-CM | POA: Diagnosis not present

## 2015-10-25 DIAGNOSIS — I10 Essential (primary) hypertension: Secondary | ICD-10-CM | POA: Diagnosis not present

## 2015-10-29 DIAGNOSIS — I10 Essential (primary) hypertension: Secondary | ICD-10-CM | POA: Diagnosis not present

## 2015-10-29 DIAGNOSIS — R0602 Shortness of breath: Secondary | ICD-10-CM | POA: Diagnosis not present

## 2015-10-29 DIAGNOSIS — N183 Chronic kidney disease, stage 3 (moderate): Secondary | ICD-10-CM | POA: Diagnosis not present

## 2015-10-29 DIAGNOSIS — I129 Hypertensive chronic kidney disease with stage 1 through stage 4 chronic kidney disease, or unspecified chronic kidney disease: Secondary | ICD-10-CM | POA: Diagnosis not present

## 2015-12-31 DIAGNOSIS — E118 Type 2 diabetes mellitus with unspecified complications: Secondary | ICD-10-CM | POA: Diagnosis not present

## 2015-12-31 DIAGNOSIS — E789 Disorder of lipoprotein metabolism, unspecified: Secondary | ICD-10-CM | POA: Diagnosis not present

## 2015-12-31 DIAGNOSIS — E790 Hyperuricemia without signs of inflammatory arthritis and tophaceous disease: Secondary | ICD-10-CM | POA: Diagnosis not present

## 2015-12-31 DIAGNOSIS — I10 Essential (primary) hypertension: Secondary | ICD-10-CM | POA: Diagnosis not present

## 2015-12-31 DIAGNOSIS — N390 Urinary tract infection, site not specified: Secondary | ICD-10-CM | POA: Diagnosis not present

## 2016-01-07 DIAGNOSIS — E790 Hyperuricemia without signs of inflammatory arthritis and tophaceous disease: Secondary | ICD-10-CM | POA: Diagnosis not present

## 2016-01-07 DIAGNOSIS — I10 Essential (primary) hypertension: Secondary | ICD-10-CM | POA: Diagnosis not present

## 2016-01-07 DIAGNOSIS — E789 Disorder of lipoprotein metabolism, unspecified: Secondary | ICD-10-CM | POA: Diagnosis not present

## 2016-02-05 DIAGNOSIS — H401131 Primary open-angle glaucoma, bilateral, mild stage: Secondary | ICD-10-CM | POA: Diagnosis not present

## 2016-02-05 DIAGNOSIS — Z961 Presence of intraocular lens: Secondary | ICD-10-CM | POA: Diagnosis not present

## 2016-02-28 DIAGNOSIS — Z23 Encounter for immunization: Secondary | ICD-10-CM | POA: Diagnosis not present

## 2016-03-30 DIAGNOSIS — N183 Chronic kidney disease, stage 3 (moderate): Secondary | ICD-10-CM | POA: Diagnosis not present

## 2016-03-30 DIAGNOSIS — R0602 Shortness of breath: Secondary | ICD-10-CM | POA: Diagnosis not present

## 2016-03-30 DIAGNOSIS — I129 Hypertensive chronic kidney disease with stage 1 through stage 4 chronic kidney disease, or unspecified chronic kidney disease: Secondary | ICD-10-CM | POA: Diagnosis not present

## 2016-03-30 DIAGNOSIS — I10 Essential (primary) hypertension: Secondary | ICD-10-CM | POA: Diagnosis not present

## 2016-04-13 DIAGNOSIS — B079 Viral wart, unspecified: Secondary | ICD-10-CM | POA: Diagnosis not present

## 2016-04-13 DIAGNOSIS — M79672 Pain in left foot: Secondary | ICD-10-CM | POA: Diagnosis not present

## 2016-04-13 DIAGNOSIS — M79671 Pain in right foot: Secondary | ICD-10-CM | POA: Diagnosis not present

## 2016-04-14 ENCOUNTER — Other Ambulatory Visit: Payer: Self-pay | Admitting: Endocrinology

## 2016-04-14 DIAGNOSIS — Z1231 Encounter for screening mammogram for malignant neoplasm of breast: Secondary | ICD-10-CM

## 2016-04-27 DIAGNOSIS — B079 Viral wart, unspecified: Secondary | ICD-10-CM | POA: Diagnosis not present

## 2016-04-27 DIAGNOSIS — M25572 Pain in left ankle and joints of left foot: Secondary | ICD-10-CM | POA: Diagnosis not present

## 2016-04-27 DIAGNOSIS — M2012 Hallux valgus (acquired), left foot: Secondary | ICD-10-CM | POA: Diagnosis not present

## 2016-04-27 DIAGNOSIS — M71572 Other bursitis, not elsewhere classified, left ankle and foot: Secondary | ICD-10-CM | POA: Diagnosis not present

## 2016-04-27 DIAGNOSIS — M79671 Pain in right foot: Secondary | ICD-10-CM | POA: Diagnosis not present

## 2016-04-27 DIAGNOSIS — M2011 Hallux valgus (acquired), right foot: Secondary | ICD-10-CM | POA: Diagnosis not present

## 2016-04-27 DIAGNOSIS — M25571 Pain in right ankle and joints of right foot: Secondary | ICD-10-CM | POA: Diagnosis not present

## 2016-04-27 DIAGNOSIS — M71571 Other bursitis, not elsewhere classified, right ankle and foot: Secondary | ICD-10-CM | POA: Diagnosis not present

## 2016-05-04 DIAGNOSIS — E118 Type 2 diabetes mellitus with unspecified complications: Secondary | ICD-10-CM | POA: Diagnosis not present

## 2016-05-04 DIAGNOSIS — I10 Essential (primary) hypertension: Secondary | ICD-10-CM | POA: Diagnosis not present

## 2016-05-12 ENCOUNTER — Ambulatory Visit: Payer: Medicare Other

## 2016-05-12 DIAGNOSIS — E789 Disorder of lipoprotein metabolism, unspecified: Secondary | ICD-10-CM | POA: Diagnosis not present

## 2016-05-12 DIAGNOSIS — I10 Essential (primary) hypertension: Secondary | ICD-10-CM | POA: Diagnosis not present

## 2016-05-12 DIAGNOSIS — E790 Hyperuricemia without signs of inflammatory arthritis and tophaceous disease: Secondary | ICD-10-CM | POA: Diagnosis not present

## 2016-05-13 ENCOUNTER — Ambulatory Visit
Admission: RE | Admit: 2016-05-13 | Discharge: 2016-05-13 | Disposition: A | Discharge status: Home / Self Care | Payer: Medicare Other | Source: Ambulatory Visit | Attending: Endocrinology | Admitting: Endocrinology

## 2016-05-13 DIAGNOSIS — Z1231 Encounter for screening mammogram for malignant neoplasm of breast: Secondary | ICD-10-CM

## 2016-06-22 DIAGNOSIS — M79671 Pain in right foot: Secondary | ICD-10-CM | POA: Diagnosis not present

## 2016-06-22 DIAGNOSIS — B079 Viral wart, unspecified: Secondary | ICD-10-CM | POA: Diagnosis not present

## 2016-08-05 DIAGNOSIS — I10 Essential (primary) hypertension: Secondary | ICD-10-CM | POA: Diagnosis not present

## 2016-08-05 DIAGNOSIS — E118 Type 2 diabetes mellitus with unspecified complications: Secondary | ICD-10-CM | POA: Diagnosis not present

## 2016-08-19 DIAGNOSIS — E789 Disorder of lipoprotein metabolism, unspecified: Secondary | ICD-10-CM | POA: Diagnosis not present

## 2016-08-19 DIAGNOSIS — M109 Gout, unspecified: Secondary | ICD-10-CM | POA: Diagnosis not present

## 2016-08-19 DIAGNOSIS — I10 Essential (primary) hypertension: Secondary | ICD-10-CM | POA: Diagnosis not present

## 2016-09-16 DIAGNOSIS — H401131 Primary open-angle glaucoma, bilateral, mild stage: Secondary | ICD-10-CM | POA: Diagnosis not present

## 2016-09-16 DIAGNOSIS — Z961 Presence of intraocular lens: Secondary | ICD-10-CM | POA: Diagnosis not present

## 2016-10-07 DIAGNOSIS — I5033 Acute on chronic diastolic (congestive) heart failure: Secondary | ICD-10-CM | POA: Diagnosis not present

## 2016-10-07 DIAGNOSIS — N183 Chronic kidney disease, stage 3 (moderate): Secondary | ICD-10-CM | POA: Diagnosis not present

## 2016-10-07 DIAGNOSIS — I4891 Unspecified atrial fibrillation: Secondary | ICD-10-CM | POA: Diagnosis not present

## 2016-10-07 DIAGNOSIS — I129 Hypertensive chronic kidney disease with stage 1 through stage 4 chronic kidney disease, or unspecified chronic kidney disease: Secondary | ICD-10-CM | POA: Diagnosis not present

## 2016-10-12 DIAGNOSIS — I5032 Chronic diastolic (congestive) heart failure: Secondary | ICD-10-CM | POA: Diagnosis not present

## 2016-10-12 DIAGNOSIS — I4891 Unspecified atrial fibrillation: Secondary | ICD-10-CM | POA: Diagnosis not present

## 2016-10-12 DIAGNOSIS — R0602 Shortness of breath: Secondary | ICD-10-CM | POA: Diagnosis not present

## 2016-10-20 DIAGNOSIS — I4891 Unspecified atrial fibrillation: Secondary | ICD-10-CM | POA: Diagnosis not present

## 2016-10-20 DIAGNOSIS — R0602 Shortness of breath: Secondary | ICD-10-CM | POA: Diagnosis not present

## 2016-10-20 DIAGNOSIS — I5033 Acute on chronic diastolic (congestive) heart failure: Secondary | ICD-10-CM | POA: Diagnosis not present

## 2016-10-25 DIAGNOSIS — I480 Paroxysmal atrial fibrillation: Secondary | ICD-10-CM | POA: Diagnosis present

## 2016-10-25 NOTE — H&P (Signed)
OFFICE VISIT NOTES COPIED TO EPIC FOR DOCUMENTATION  . History of Present Illness Shannon Page MD; 10/07/2016 1:30 PM) Patient words: 6 month f/u for HTN; last office visit 03/31/2016;Pt states she is having alittle SOB;Pt states it has been ongoing for about 2 weeks with pedal edema & leg edema.  The patient is a 83 year old female who presents for a Follow-up for Hypertension. She has a history of difficult to control hypertension, stage III chronic kidney disease, and obesity. She presents today for 6 month follow up. She had been doing well and noticing marked dyspnea even with minimal activities, worsening leg edema in spite of taking torsemide that was started by her PCP, and marked fatigue. She has not had any orthopnea or chest pain or palpitation. No dizziness or syncope. She has DJD involving the knee and has been recommended knee replacement but she does not want to proceed.  Her past medical history is significant for moderate obesity, stage III chronic kidney disease, frequent PVCs and wandering atrial pacemaker by electrocardiogram, history of gout and hyperlipidemia.   Problem [name]/Past Medical Shannon Page, MD; 10/07/2016 1:30 PM) Gout (M10.9)  feet and hands Arthritis (M19.90)  all over Glaucoma, bilateral (H40.9)  Shortness of breath (R06.02)  Echocardiogram 10/08/2015: 1. Left ventricle cavity is normal in size. Moderate concentric hypertrophy of the left ventricle. Wall motion appears normal. LVEF apears to be preserved aat 60% without wall motion abnormality. Decreased sensitivity of wall motion due to obesity. Doppler evidence of grade II (pseudonormal) diastolic dysfunction. 2. Left atrial cavity is severely dilated. 3. Right atrial cavity is mildly dilated. 4. The right ventricle is not well seen. Right ventricle cavity is mildly dilated. Normal right ventricular function. 5. Mild aortic regurgitation. 6. Mild to moderate mitral regurgitation. 7. Moderate  tricuspid regurgitation. Moderate pulmonary hypertension. Pulmonary artery systolic pressure is estimated at 55 mm Hg. 8. Mild pulmonic regurgitation. 9. Insignificant pericardial effusion. 10. IVC is dilated with blunted respiratory response. This suggests elevated right heart pressure. Compared to the study done on 05/27/12, there is no significant chagne overall. Hypertension, benign (I10)  Hyperlipidemia, group A (E78.00)  Bradycardia by electrocardiogram (R00.1)  Hypertensive kidney disease with chronic kidney disease stage III (I12.9)  Renal artery duplex- Radiology 06/25/2014: Portions of the right and left renal arteries were obscured by atherosclerotic calcifications. Systolic velocity measurement ratio to the aortic velocity at the origin of the right renal artery exceeds normal values and significant stenosis of the right renal artery is not excluded. CT angiogram or MRA angiogram is recommended. The origin of the left renal artery was not visualized therefore isn't completely characterized. Wandering atrial pacemaker by electrocardiogram (I49.8)  Moderate obesity (E66.8)  Renal artery stenosis, native (I70.1)  07/31/2014: Renal angio: Left mild disease. Right mild to moderate disease. Unable to perform pressure pull back to evaluate significance. Does not appear high grade. Preop cardiovascular exam (Z01.810)  Possible left knee replacement by Shannon [name].  Lexiscan stress 05/27/12: Resting EKG sinus rhythm with 1st degree AV block, Poor R wave progression. Stress EKG was non diagnostic for ischemia. The perfusion study demonstrated normal isotope uptake both at rest and stress. Left ventricular ejection fraction was estimated to be 45%. This represents a low risk study. (Marked as Inactive) Labwork  12/31/2015: CBC normal, glucose 120, creatinine 1.5, EGFR 36, potassium 4.7, CMP otherwise normal, HbA1c 6.1%, total cholesterol 134, triglycerides 125, HDL 43, LDL 66, TSH 2.31, thyroid  panel normal, urine positive for microalbumin,  uric acid normal 10/25/2015: Glucose 124, BUN 39, creatinine 1.6, eGFR 29, potassium 4.7 07/25/2014: CBC normal, serum glucose 100, BUN 30, creatinine 1.28, eGFR 45, BMP otherwise normal, PT 10.1, INR 1.0, APTT 32 06/20/2014: BUN 60, creatinine 2.1, EGFR 29, potassium 5.2, CMP otherwise normal, HbA1c 6.4% 06/11/2014: Serum glucose 127, BUN 62, creatinine 1.90, eGFR 28, potassium 5.7 03/21/2014: HbA1c 6.5%. Total cholesterol 134, triglycerides 137, HDL 39, LDL 68, glucose 139 mg, BUN 34, serum creatinine 1.7 with eGFR 37.1 mL. CMP otherwise within normal limits. Secondary pulmonary hypertension  Chronic diastolic heart failure (M42.68)  Frequent PVCs (I49.3)   Allergies (Shannon [name]; 2016-11-01 11:45 AM) Zestril *ANTIHYPERTENSIVES*  Shortness of breath.  Family History Shannon [name]; 11-01-2016 11:45 AM) Mother  Deceased. at age 56 from bowel obstruction. Father  Deceased. at age 76 from heart trouble? Siblings  4 siblings-no known heart conditions. Brother 1  Half brother; 80 years older  Social History Shannon [name]; 2016-11-01 11:45 AM) Current tobacco use  Never smoker. Non Drinker/No Alcohol Use  Marital status  Married. Number of Children  7. 4 living Living Situation  Lives with spouse.  Past Surgical History Shannon [name]; 2016/11/01 11:52 AM) None [Nov 01, 2016]:  Medication History Shannon Page, MD; 11/01/16 12:17 PM) HydrALAZINE HCl (50MG Tablet, 2 Oral two times daily, Taken starting 08/25/2016) Active. (Please disregard the 30day rx) Exforge (5-320MG Tablet, 1 (one) Tablet Oral daily, Taken starting 07/27/2016) Active. (do not substitute for generic) Pindolol (5MG Tablet, 1 (one) Tablet Tablet Tablet T Oral daily, Taken starting 06/11/2015) Active. Crestor (20MG Tablet, 1 Oral daily) Active. Torsemide (20MG Tablet, 2 Oral daily) Active. Colcrys (0.6MG Tablet, 1 Oral daily) Active. Uloric (80MG Tablet,  1 Oral daily) Active. Ecotrin (325MG Tablet DR, 1 Oral daily) Active. Latanoprost (0.005% Solution, 1 drop both eyes Ophthalmic bedtime) Active. Medications Reconciled (verbally with patient)  Diagnostic Studies History Shannon [name]; 11-01-16 11:45 AM) Colonoscopy [2003]: benign polyp removed Echocardiogram [11/02/2015]: 1. Left ventricle cavity is normal in size. Moderate concentric hypertrophy of the left ventricle. Wall motion appears normal. LVEF apears to be preserved aat 60% without wall motion abnormality. Decreased sensitivity of wall motion due to obesity. Doppler evidence of grade II (pseudonormal) diastolic dysfunction. 2. Left atrial cavity is severely dilated. 3. Right atrial cavity is mildly dilated. 4. The right ventricle is not well seen. Right ventricle cavity is mildly dilated. Normal right ventricular function. 5. Mild aortic regurgitation. 6. Mild to moderate mitral regurgitation. 7. Moderate tricuspid regurgitation. Moderate pulmonary hypertension. Pulmonary artery systolic pressure is estimated at 55 mm Hg. 8. Mild pulmonic regurgitation. 9. Insignificant pericardial effusion. 10. IVC is dilated with blunted respiratory response. This suggests elevated right heart pressure. Compared to the study done on 05/27/12, there is no significant chagne overall. Endoscopy [2003]: Normal. Echocardiogram [05/11/2012]: 05/11/12- EF-63%, Abn. relaxation,Mod. to Sev. enlarged LA, Trace MR, Mild TR, Mild PAH- 48 mm of Hg. Nuclear stress test [05/27/2012]: Lexiscan stress 05/27/12: 1. Resting EKG sinus rhythm with 1st degree AV block, Poor R wave progression. Stress EKG was non diagnostic for ischemia. No ST-T changes of ischemia noted with pharmacologic stress testing. 2. The perfusion study demonstrated normal isotope uptake both at rest and stress. There was no evidence of ischemia or scar. Dynamic gated images reveal normal wall motion and endocardial thickening. Left  ventricular ejection fraction was estimated to be 45%. 3. This represents a low risk study. Renal Dopplers [06/25/2014]: Renal artery duplex : Portions of the right and left renal arteries were obscured by  atherosclerotic calcifications. Systolic velocity measurement ratio to the aortic velocity at the origin of the right renal artery exceeds normal values and significant stenosis of the right renal artery is not excluded. CT angiogram or MRA angiogram is recommended. The origin of the left renal artery was not visualized therefore isn't completely characterized. Renal Angio [07/31/2014]: Left mild disease. Right mild to moderate disease. Unable to perform pressure pull back to evaluate significance. Does not appear high grade.    Review of Systems Shannon Page MD; 10/07/2016 12:14 PM) General Present- Fatigue. Not Present- Anorexia and Fever. Respiratory Present- Difficulty Breathing on Exertion (worse last 3 days). Not Present- Cough and Dyspnea. Cardiovascular Present- Edema (3 weeks). Not Present- Chest Pain, Claudications, Orthopnea, Palpitations and Paroxysmal Nocturnal Dyspnea. Gastrointestinal Not Present- Black, Tarry Stool, Change in Bowel Habits and Nausea. Musculoskeletal Present- Joint Pain (DJD). Neurological Not Present- Focal Neurological Symptoms and Syncope. Endocrine Not Present- Cold Intolerance, Excessive Sweating, Heat Intolerance and Thyroid Problems. Hematology Not Present- Anemia, Easy Bruising, Petechiae and Prolonged Bleeding.  Vitals Shannon Page MD; 10/07/2016 12:10 PM) 10/07/2016 11:48 AM Weight: 244.13 lb Height: 67in Body Surface Area: 2.2 m Body Mass Index: 38.23 kg/m  Pulse: 83 (Irregular)  P.OX: 94% (Room air) BP: 130/70 (Sitting, Left Arm, Standard)       Physical Exam Shannon Page MD; 10/07/2016 12:14 PM) General Mental Status-Alert. General Appearance-Cooperative, Appears stated age, Not in acute  distress. Orientation-Oriented X3. Build & Nutrition-Well built and Moderately obese.  Chest and Lung Exam Palpation Tender - No chest wall tenderness. Auscultation Breath sounds - Clear.  Cardiovascular Inspection Jugular vein - Right - Distended. Auscultation Rhythm - Irregular. Heart Sounds - S1 is variable, S2 WNL and No gallop present. Murmurs & Other Heart Sounds - Murmur - No murmur.  Peripheral Vascular Lower Extremity Inspection - Bilateral - Inspection Normal. Palpation - Edema - Bilateral - 3+ Pitting edema. Femoral pulse - Bilateral - Normal. Popliteal pulse - Bilateral - Feeble(Pulsus difficult to feel due to patient's bodily habitus.). Dorsalis pedis pulse - Left - Feeble. Right - Normal. Posterior tibial pulse - Bilateral - Feeble. Carotid arteries - Bilateral-No Carotid bruit.  Neurologic Motor-Grossly intact without any focal deficits.  Musculoskeletal - Did not examine.    Assessment & Plan (Devonna Shumate; 10/07/2016 12:53 PM) Atrial fibrillation, new onset (I48.91) CHA2DS2-VASc Score is 5 with yearly risk of stroke of  6.7%.  Impression: EKG 10/07/2016: Atrial fibrillation with controlled ventricular response at the rate of 68 bpm, right axis deviation, anterior infarct old. Nonspecific T abnormality.  EKG 03/20/2016: Sinus rhythm with first-degree AV block at the rate of 70 bpm, normal axis, poor R-wave progression, cannot exclude anterior infarct old, pulmonary disease pattern. Frequent PVCs. No significant change from EKG 09/25/2015 Current Plans Complete electrocardiogram (93000) Started Xarelto 15MG, 1 (one) Tablet every evening after dinner, #30, 10/07/2016, Ref. x6. Future Plans 10/12/2016: Myocardial perfusion imaging, tomographic (SPECT) (including attenuation correction, qualitative or quantitative wall motion, ejection fraction by first pass or gated technique, additional quantification, when performed) - one time 10/20/2016: Echocardiography,  transthoracic, real-time with image documentation (2D), includes M-mode recording, when performed, complete, with spectral Doppler echocardiography, and with color flow Doppler echocardiography (82800) - one time 08/16/9177: METABOLIC PANEL, BASIC (15056) - one time 10/15/2016: CBC & PLATELETS (AUTO) (97948) - one time Acute on chronic diastolic CHF (congestive heart failure) (I50.33) Current Plans Started MetOLazone 5MG, 1 (one) Tablet every morning as directed, #10, 10/07/2016, No Refill. Started Potassium Chloride Crys ER  20MEQ, 1 (one) Tablet every morning with Metolazone only, #10, 10/07/2016, No Refill. Hypertensive kidney disease with chronic kidney disease stage III (I12.9) Story: Renal artery duplex- Radiology 06/25/2014: Portions of the right and left renal arteries were obscured by atherosclerotic calcifications. Systolic velocity measurement ratio to the aortic velocity at the origin of the right renal artery exceeds normal values and significant stenosis of the right renal artery is not excluded. CT angiogram or MRA angiogram is recommended. The origin of the left renal artery was not visualized therefore isn't completely characterized. Shortness of breath (R06.02) Story: Echocardiogram 10/08/2015: 1. Left ventricle cavity is normal in size. Moderate concentric hypertrophy of the left ventricle. Wall motion appears normal. LVEF apears to be preserved aat 60% without wall motion abnormality. Decreased sensitivity of wall motion due to obesity. Doppler evidence of grade II (pseudonormal) diastolic dysfunction. 2. Left atrial cavity is severely dilated. 3. Right atrial cavity is mildly dilated. 4. The right ventricle is not well seen. Right ventricle cavity is mildly dilated. Normal right ventricular function. 5. Mild aortic regurgitation. 6. Mild to moderate mitral regurgitation. 7. Moderate tricuspid regurgitation. Moderate pulmonary hypertension. Pulmonary artery systolic pressure is  estimated at 55 mm Hg. 8. Mild pulmonic regurgitation. 9. Insignificant pericardial effusion. 10. IVC is dilated with blunted respiratory response. This suggests elevated right heart pressure. Compared to the study done on 05/27/12, there is no significant chagne overall. Impression: . Labwork Story: Labs 10/20/2016: Serum glucose 111 mg, nonfasting.  BUN 48, creatinine 1.78, eGFR 30 mL.  HB 13.6/HCT 39.6, platelets 209, normal indicis. 12/31/2015: CBC normal, glucose 120, creatinine 1.5, EGFR 36, potassium 4.7, CMP otherwise normal, HbA1c 6.1%, total cholesterol 134, triglycerides 125, HDL 43, LDL 66, TSH 2.31, thyroid panel normal, urine positive for microalbumin, uric acid normal  10/25/2015: Glucose 124, BUN 39, creatinine 1.6, eGFR 29, potassium 4.7  07/25/2014: CBC normal, serum glucose 100, BUN 30, creatinine 1.28, eGFR 45, BMP otherwise normal, PT 10.1, INR 1.0, APTT 32  06/20/2014: BUN 60, creatinine 2.1, EGFR 29, potassium 5.2, CMP otherwise normal, HbA1c 6.4%  06/11/2014: Serum glucose 127, BUN 62, creatinine 1.90, eGFR 28, potassium 5.7  03/21/2014: HbA1c 6.5%. Total cholesterol 134, triglycerides 137, HDL 39, LDL 68, glucose 139 mg, BUN 34, serum creatinine 1.7 with eGFR 37.1 mL. CMP otherwise within normal limits. Current Plans Mechanism of underlying disease process and action of medications discussed with the patient. I discussed primary/secondary prevention and also dietary counseling was done. Patient has new onset of acute diastolic heart failure with 3+ leg edema, worsening dyspnea and elevated JVD. This is probably related to new onset of atrial fibrillation with controlled ventricular response. I have started her on Xarelto 15 mg p.o. q.p.m. after dinner. Also given her a prescription for metolazone 5 mg daily advised her to take it for at least 3-5 days and then only as needed for leg edema along with spasm 20 mEq daily. She will continue with torsemide 20 mg b.i.d. that she  has been previously on. I'll repeat Lexiscan Myoview stress test along with an echocardiogram and I'll see her back after the test. She will need BMP and CBC in 2 weeks. I will contemplate cardioversion in 3 weeks. She is advised to restrict her activity, advised her to take it easy at home, to keep the foot elevated and not to eat out.  CC Dr. Gareth Eagle.    Signed by Shannon Page, MD (10/07/2016 1:31 PM)

## 2016-10-27 ENCOUNTER — Encounter (HOSPITAL_COMMUNITY)
Admission: RE | Disposition: A | Discharge status: Home / Self Care | Payer: Self-pay | Source: Ambulatory Visit | Attending: Cardiology

## 2016-10-27 ENCOUNTER — Ambulatory Visit (HOSPITAL_COMMUNITY)
Admission: RE | Admit: 2016-10-27 | Discharge: 2016-10-27 | Disposition: A | Discharge status: Home / Self Care | Payer: Medicare Other | Source: Ambulatory Visit | Attending: Cardiology | Admitting: Cardiology

## 2016-10-27 ENCOUNTER — Ambulatory Visit (HOSPITAL_COMMUNITY): Payer: Medicare Other | Admitting: Anesthesiology

## 2016-10-27 ENCOUNTER — Encounter (HOSPITAL_COMMUNITY): Payer: Self-pay | Admitting: *Deleted

## 2016-10-27 DIAGNOSIS — I272 Pulmonary hypertension, unspecified: Secondary | ICD-10-CM | POA: Diagnosis not present

## 2016-10-27 DIAGNOSIS — M1990 Unspecified osteoarthritis, unspecified site: Secondary | ICD-10-CM | POA: Insufficient documentation

## 2016-10-27 DIAGNOSIS — I081 Rheumatic disorders of both mitral and tricuspid valves: Secondary | ICD-10-CM | POA: Diagnosis not present

## 2016-10-27 DIAGNOSIS — M109 Gout, unspecified: Secondary | ICD-10-CM | POA: Diagnosis not present

## 2016-10-27 DIAGNOSIS — I480 Paroxysmal atrial fibrillation: Secondary | ICD-10-CM | POA: Diagnosis present

## 2016-10-27 DIAGNOSIS — I5033 Acute on chronic diastolic (congestive) heart failure: Secondary | ICD-10-CM | POA: Insufficient documentation

## 2016-10-27 DIAGNOSIS — I493 Ventricular premature depolarization: Secondary | ICD-10-CM | POA: Insufficient documentation

## 2016-10-27 DIAGNOSIS — Z6838 Body mass index (BMI) 38.0-38.9, adult: Secondary | ICD-10-CM | POA: Insufficient documentation

## 2016-10-27 DIAGNOSIS — I130 Hypertensive heart and chronic kidney disease with heart failure and stage 1 through stage 4 chronic kidney disease, or unspecified chronic kidney disease: Secondary | ICD-10-CM | POA: Insufficient documentation

## 2016-10-27 DIAGNOSIS — I4891 Unspecified atrial fibrillation: Secondary | ICD-10-CM | POA: Diagnosis not present

## 2016-10-27 DIAGNOSIS — E669 Obesity, unspecified: Secondary | ICD-10-CM | POA: Diagnosis not present

## 2016-10-27 DIAGNOSIS — E7800 Pure hypercholesterolemia, unspecified: Secondary | ICD-10-CM | POA: Insufficient documentation

## 2016-10-27 DIAGNOSIS — N183 Chronic kidney disease, stage 3 (moderate): Secondary | ICD-10-CM | POA: Diagnosis not present

## 2016-10-27 HISTORY — PX: CARDIOVERSION: SHX1299

## 2016-10-27 SURGERY — CARDIOVERSION
Anesthesia: Monitor Anesthesia Care

## 2016-10-27 MED ORDER — SODIUM CHLORIDE 0.9 % IV SOLN
INTRAVENOUS | Status: DC | PRN
  Administered 2016-10-27: 13:00:00 via INTRAVENOUS

## 2016-10-27 MED ORDER — LIDOCAINE HCL (CARDIAC) 20 MG/ML IV SOLN
INTRAVENOUS | Status: DC | PRN
  Administered 2016-10-27: 40 mg via INTRATRACHEAL

## 2016-10-27 MED ORDER — PROPOFOL 10 MG/ML IV BOLUS
INTRAVENOUS | Status: DC | PRN
  Administered 2016-10-27: 70 mg via INTRAVENOUS

## 2016-10-27 NOTE — Discharge Instructions (Signed)
Electrical Cardioversion, Care After °This sheet gives you information about how to care for yourself after your procedure. Your health care provider may also give you more specific instructions. If you have problems or questions, contact your health care provider. °What can I expect after the procedure? °After the procedure, it is common to have: °· Some redness on the skin where the shocks were given. °Follow these instructions at home: °· Do not drive for 24 hours if you were given a medicine to help you relax (sedative). °· Take over-the-counter and prescription medicines only as told by your health care provider. °· Ask your health care provider how to check your pulse. Check it often. °· Rest for 48 hours after the procedure or as told by your health care provider. °· Avoid or limit your caffeine use as told by your health care provider. °Contact a health care provider if: °· You feel like your heart is beating too quickly or your pulse is not regular. °· You have a serious muscle cramp that does not go away. °Get help right away if: °· You have discomfort in your chest. °· You are dizzy or you feel faint. °· You have trouble breathing or you are short of breath. °· Your speech is slurred. °· You have trouble moving an arm or leg on one side of your body. °· Your fingers or toes turn cold or blue. °This information is not intended to replace advice given to you by your health care provider. Make sure you discuss any questions you have with your health care provider. °Document Released: 03/22/2013 Document Revised: 01/03/2016 Document Reviewed: 12/06/2015 °Elsevier Interactive Patient Education © 2017 [employer] ° °

## 2016-10-27 NOTE — Anesthesia Postprocedure Evaluation (Addendum)
Anesthesia Post Note  Patient: Shannon [name]  Procedure(s) Performed: Procedure(s) (LRB): CARDIOVERSION (N/A)  Patient location during evaluation: Endoscopy Anesthesia Type: General Level of consciousness: awake and sedated Pain management: pain level controlled Vital Signs Assessment: post-procedure vital signs reviewed and stable Respiratory status: spontaneous breathing Cardiovascular status: stable Postop Assessment: no signs of nausea or vomiting Anesthetic complications: no        Last Vitals:  Vitals:   10/27/16 1259 10/27/16 1347  BP: (!) 167/96 (!) 130/101  Pulse:  73  Resp: 15 17    Last Pain:  Vitals:   10/27/16 1347  TempSrc: Oral   Pain Goal:                 Alysa Duca JR,JOHN Allyah Heather

## 2016-10-27 NOTE — CV Procedure (Signed)
Direct current cardioversion:  Indication symptomatic A. Fibrillation.  Procedure: Using 70 mg of IV Propofol and 40 IV Lidocaine (for reducing venous pain) for achieving deep sedation, synchronized direct current cardioversion performed. Patient was delivered with 120 Joules of electricity X 1 with success to junctional escape rhythm which spontaneously converted to sinus in a few seconds. Patient tolerated the procedure well. No immediate complication noted.

## 2016-10-27 NOTE — Interval H&P Note (Signed)
History and Physical Interval Note:  10/27/2016 1:35 PM  Shannon [name]  has presented today for surgery, with the diagnosis of AFIB  The various methods of treatment have been discussed with the patient and family. After consideration of risks, benefits and other options for treatment, the patient has consented to  Procedure(s): CARDIOVERSION (N/A) as a surgical intervention .  The patient's history has been reviewed, patient examined, no change in status, stable for surgery.  I have reviewed the patient's chart and labs.  Questions were answered to the patient's satisfaction.     Adrian Prows

## 2016-10-27 NOTE — Transfer of Care (Signed)
Immediate Anesthesia Transfer of Care Note  Patient: Shannon [name]  Procedure(s) Performed: Procedure(s): CARDIOVERSION (N/A)  Patient Location: Endoscopy Unit  Anesthesia Type:MAC  Level of Consciousness: awake, alert  and oriented  Airway & Oxygen Therapy: Patient Spontanous Breathing and Patient connected to nasal cannula oxygen  Post-op Assessment: Report given to RN, Post -op Vital signs reviewed and stable and Patient moving all extremities X 4  Post vital signs: Reviewed and stable  Last Vitals:  Vitals:   10/27/16 1259 10/27/16 1347  BP: (!) 167/96 (!) 130/101  Pulse:  73  Resp: 15 17    Last Pain:  Vitals:   10/27/16 1347  TempSrc: Oral         Complications: No apparent anesthesia complications

## 2016-10-27 NOTE — Anesthesia Preprocedure Evaluation (Signed)
Anesthesia Evaluation  Patient identified by MRN, date of birth, ID band Patient awake    Reviewed: NPO status , Patient's Chart, lab work & pertinent test results, reviewed documented beta blocker date and time   Airway Mallampati: I       Dental  (+) Teeth Intact   Pulmonary    Pulmonary exam normal        Cardiovascular hypertension, Normal cardiovascular exam Rhythm:Irregular Rate:Normal     Neuro/Psych negative neurological ROS  negative psych ROS   GI/Hepatic negative GI ROS, Neg liver ROS,   Endo/Other  negative endocrine ROS  Renal/GU negative Renal ROS     Musculoskeletal   Abdominal (+) + obese,   Peds  Hematology negative hematology ROS (+)   Anesthesia Other Findings   Reproductive/Obstetrics                             Anesthesia Physical Anesthesia Plan  ASA: II  Anesthesia Plan: General   Post-op Pain Management:    Induction: Intravenous  Airway Management Planned: Natural Airway  Additional Equipment:   Intra-op Plan:   Post-operative Plan:   Informed Consent: I have reviewed the patients History and Physical, chart, labs and discussed the procedure including the risks, benefits and alternatives for the proposed anesthesia with the patient or authorized representative who has indicated his/her understanding and acceptance.     Plan Discussed with: CRNA and Surgeon  Anesthesia Plan Comments:         Anesthesia Quick Evaluation

## 2016-10-28 ENCOUNTER — Encounter (HOSPITAL_COMMUNITY): Payer: Self-pay | Admitting: Cardiology

## 2016-11-05 DIAGNOSIS — Z8679 Personal history of other diseases of the circulatory system: Secondary | ICD-10-CM | POA: Diagnosis not present

## 2016-11-05 DIAGNOSIS — I129 Hypertensive chronic kidney disease with stage 1 through stage 4 chronic kidney disease, or unspecified chronic kidney disease: Secondary | ICD-10-CM | POA: Diagnosis not present

## 2016-11-05 DIAGNOSIS — N183 Chronic kidney disease, stage 3 (moderate): Secondary | ICD-10-CM | POA: Diagnosis not present

## 2016-11-05 DIAGNOSIS — I5032 Chronic diastolic (congestive) heart failure: Secondary | ICD-10-CM | POA: Diagnosis not present

## 2016-11-11 DIAGNOSIS — M79675 Pain in left toe(s): Secondary | ICD-10-CM | POA: Diagnosis not present

## 2016-11-18 DIAGNOSIS — E789 Disorder of lipoprotein metabolism, unspecified: Secondary | ICD-10-CM | POA: Diagnosis not present

## 2016-11-18 DIAGNOSIS — E118 Type 2 diabetes mellitus with unspecified complications: Secondary | ICD-10-CM | POA: Diagnosis not present

## 2016-11-18 DIAGNOSIS — I10 Essential (primary) hypertension: Secondary | ICD-10-CM | POA: Diagnosis not present

## 2016-11-18 DIAGNOSIS — M109 Gout, unspecified: Secondary | ICD-10-CM | POA: Diagnosis not present

## 2016-11-20 NOTE — Addendum Note (Signed)
Addendum  created 11/20/16 1236 by Lyn Hollingshead, MD   Sign clinical note

## 2016-11-25 DIAGNOSIS — E789 Disorder of lipoprotein metabolism, unspecified: Secondary | ICD-10-CM | POA: Diagnosis not present

## 2016-11-25 DIAGNOSIS — I10 Essential (primary) hypertension: Secondary | ICD-10-CM | POA: Diagnosis not present

## 2016-11-25 DIAGNOSIS — M109 Gout, unspecified: Secondary | ICD-10-CM | POA: Diagnosis not present

## 2016-11-25 DIAGNOSIS — I4891 Unspecified atrial fibrillation: Secondary | ICD-10-CM | POA: Diagnosis not present

## 2016-11-30 DIAGNOSIS — M79671 Pain in right foot: Secondary | ICD-10-CM | POA: Diagnosis not present

## 2016-11-30 DIAGNOSIS — M21611 Bunion of right foot: Secondary | ICD-10-CM | POA: Diagnosis not present

## 2016-11-30 DIAGNOSIS — M79672 Pain in left foot: Secondary | ICD-10-CM | POA: Diagnosis not present

## 2016-11-30 DIAGNOSIS — G8929 Other chronic pain: Secondary | ICD-10-CM | POA: Diagnosis not present

## 2017-02-01 DIAGNOSIS — M71572 Other bursitis, not elsewhere classified, left ankle and foot: Secondary | ICD-10-CM | POA: Diagnosis not present

## 2017-02-01 DIAGNOSIS — L600 Ingrowing nail: Secondary | ICD-10-CM | POA: Diagnosis not present

## 2017-02-02 DIAGNOSIS — Z8679 Personal history of other diseases of the circulatory system: Secondary | ICD-10-CM | POA: Diagnosis not present

## 2017-02-02 DIAGNOSIS — I129 Hypertensive chronic kidney disease with stage 1 through stage 4 chronic kidney disease, or unspecified chronic kidney disease: Secondary | ICD-10-CM | POA: Diagnosis not present

## 2017-02-02 DIAGNOSIS — I5032 Chronic diastolic (congestive) heart failure: Secondary | ICD-10-CM | POA: Diagnosis not present

## 2017-02-02 DIAGNOSIS — R0602 Shortness of breath: Secondary | ICD-10-CM | POA: Diagnosis not present

## 2017-02-25 DIAGNOSIS — M1711 Unilateral primary osteoarthritis, right knee: Secondary | ICD-10-CM | POA: Diagnosis not present

## 2017-03-10 DIAGNOSIS — L03032 Cellulitis of left toe: Secondary | ICD-10-CM | POA: Diagnosis not present

## 2017-03-10 DIAGNOSIS — B079 Viral wart, unspecified: Secondary | ICD-10-CM | POA: Diagnosis not present

## 2017-03-10 DIAGNOSIS — M79671 Pain in right foot: Secondary | ICD-10-CM | POA: Diagnosis not present

## 2017-03-10 DIAGNOSIS — M2042 Other hammer toe(s) (acquired), left foot: Secondary | ICD-10-CM | POA: Diagnosis not present

## 2017-03-22 DIAGNOSIS — E118 Type 2 diabetes mellitus with unspecified complications: Secondary | ICD-10-CM | POA: Diagnosis not present

## 2017-03-24 DIAGNOSIS — D3132 Benign neoplasm of left choroid: Secondary | ICD-10-CM | POA: Diagnosis not present

## 2017-03-24 DIAGNOSIS — H401131 Primary open-angle glaucoma, bilateral, mild stage: Secondary | ICD-10-CM | POA: Diagnosis not present

## 2017-03-24 DIAGNOSIS — Z961 Presence of intraocular lens: Secondary | ICD-10-CM | POA: Diagnosis not present

## 2017-03-25 DIAGNOSIS — T8189XA Other complications of procedures, not elsewhere classified, initial encounter: Secondary | ICD-10-CM | POA: Diagnosis not present

## 2017-03-29 ENCOUNTER — Encounter: Payer: Self-pay | Admitting: Internal Medicine

## 2017-03-29 DIAGNOSIS — Z780 Asymptomatic menopausal state: Secondary | ICD-10-CM | POA: Diagnosis not present

## 2017-03-29 DIAGNOSIS — Z0000 Encounter for general adult medical examination without abnormal findings: Secondary | ICD-10-CM | POA: Diagnosis not present

## 2017-03-29 DIAGNOSIS — M109 Gout, unspecified: Secondary | ICD-10-CM | POA: Diagnosis not present

## 2017-03-29 DIAGNOSIS — Z23 Encounter for immunization: Secondary | ICD-10-CM | POA: Diagnosis not present

## 2017-03-29 DIAGNOSIS — N183 Chronic kidney disease, stage 3 (moderate): Secondary | ICD-10-CM | POA: Diagnosis not present

## 2017-03-29 DIAGNOSIS — I701 Atherosclerosis of renal artery: Secondary | ICD-10-CM | POA: Diagnosis not present

## 2017-03-29 DIAGNOSIS — E7800 Pure hypercholesterolemia, unspecified: Secondary | ICD-10-CM | POA: Diagnosis not present

## 2017-03-29 DIAGNOSIS — I340 Nonrheumatic mitral (valve) insufficiency: Secondary | ICD-10-CM | POA: Diagnosis not present

## 2017-03-29 DIAGNOSIS — I10 Essential (primary) hypertension: Secondary | ICD-10-CM | POA: Diagnosis not present

## 2017-04-06 ENCOUNTER — Other Ambulatory Visit: Payer: Self-pay | Admitting: Internal Medicine

## 2017-04-06 DIAGNOSIS — Z1231 Encounter for screening mammogram for malignant neoplasm of breast: Secondary | ICD-10-CM

## 2017-04-07 DIAGNOSIS — L97521 Non-pressure chronic ulcer of other part of left foot limited to breakdown of skin: Secondary | ICD-10-CM | POA: Diagnosis not present

## 2017-04-07 DIAGNOSIS — B079 Viral wart, unspecified: Secondary | ICD-10-CM | POA: Diagnosis not present

## 2017-04-07 DIAGNOSIS — M79671 Pain in right foot: Secondary | ICD-10-CM | POA: Diagnosis not present

## 2017-05-17 ENCOUNTER — Ambulatory Visit
Admission: RE | Admit: 2017-05-17 | Discharge: 2017-05-17 | Disposition: A | Discharge status: Home / Self Care | Payer: Medicare Other | Source: Ambulatory Visit | Attending: Internal Medicine | Admitting: Internal Medicine

## 2017-05-17 DIAGNOSIS — Z1231 Encounter for screening mammogram for malignant neoplasm of breast: Secondary | ICD-10-CM

## 2017-05-29 DIAGNOSIS — M1711 Unilateral primary osteoarthritis, right knee: Secondary | ICD-10-CM | POA: Diagnosis not present

## 2017-06-04 DIAGNOSIS — M1711 Unilateral primary osteoarthritis, right knee: Secondary | ICD-10-CM | POA: Diagnosis not present

## 2017-06-11 DIAGNOSIS — M1711 Unilateral primary osteoarthritis, right knee: Secondary | ICD-10-CM | POA: Diagnosis not present

## 2017-06-23 DIAGNOSIS — H401131 Primary open-angle glaucoma, bilateral, mild stage: Secondary | ICD-10-CM | POA: Diagnosis not present

## 2017-06-23 DIAGNOSIS — Z961 Presence of intraocular lens: Secondary | ICD-10-CM | POA: Diagnosis not present

## 2017-07-02 ENCOUNTER — Ambulatory Visit (INDEPENDENT_AMBULATORY_CARE_PROVIDER_SITE_OTHER): Payer: Medicare Other

## 2017-07-02 ENCOUNTER — Ambulatory Visit (INDEPENDENT_AMBULATORY_CARE_PROVIDER_SITE_OTHER): Payer: Medicare Other | Admitting: Podiatry

## 2017-07-02 ENCOUNTER — Encounter: Payer: Self-pay | Admitting: Podiatry

## 2017-07-02 DIAGNOSIS — R52 Pain, unspecified: Secondary | ICD-10-CM | POA: Diagnosis not present

## 2017-07-02 DIAGNOSIS — L97521 Non-pressure chronic ulcer of other part of left foot limited to breakdown of skin: Secondary | ICD-10-CM

## 2017-07-02 MED ORDER — DOXYCYCLINE HYCLATE 100 MG PO TABS: 100.0000 mg | ORAL_TABLET | Freq: Two times a day (BID) | ORAL | 0 refills | Status: DC

## 2017-07-02 MED ORDER — MUPIROCIN 2 % EX OINT
1.0000 | TOPICAL_OINTMENT | Freq: Two times a day (BID) | CUTANEOUS | 2 refills | Status: DC
Start: 2017-07-02 — End: 2017-09-24

## 2017-07-02 NOTE — Progress Notes (Signed)
Subjective:    Patient ID: Shannon [name], female    DOB: 1931/06/15, 84 y.o.   MRN: [id number]  HPI Ms. Haun presents to the office today for concerns of painful, swelling, dark left 2nd toe after she had the nail removed. This was done at [facility]. She denies any pus but does note some bleeding. She has noticed some swelling to the toe. No redness or red streaks that she has noticed.  She is not currently taking any antibiotics.  She has no other concerns today.   Review of Systems  All other systems reviewed and are negative.  Past Medical History:  Diagnosis Date  . Arthritis    "all over"  . Gout   . Hypercholesteremia   . Hypertension   . Mitral regurgitation     Past Surgical History:  Procedure Laterality Date  . BREAST BIOPSY Left 1984  . CARDIOVERSION N/A 10/27/2016   Procedure: CARDIOVERSION;  Surgeon: Adrian Prows, MD;  Location: [place];  Service: Cardiovascular;  Laterality: N/A;  . CATARACT EXTRACTION, BILATERAL Bilateral 2000's  . RENAL ANGIOGRAM Bilateral 07/31/2014   Procedure: RENAL ANGIOGRAM;  Surgeon: Laverda Page, MD;  Location: [facility] CATH LAB;  Service: Cardiovascular;  Laterality: Bilateral;     Current Outpatient Medications:  .  amLODipine-valsartan (EXFORGE) 10-320 MG per tablet, Take 1 tablet by mouth daily., Disp: , Rfl:  .  aspirin 325 MG EC tablet, Take 325 mg by mouth daily.  , Disp: , Rfl:  .  colchicine 0.6 MG tablet, Take 0.6 mg by mouth daily.  , Disp: , Rfl:  .  febuxostat (ULORIC) 40 MG tablet, Take 80 mg by mouth every morning.  , Disp: , Rfl:  .  latanoprost (XALATAN) 0.005 % ophthalmic solution, 1 drop at bedtime.  , Disp: , Rfl:  .  pindolol (VISKEN) 5 MG tablet, Take 5 mg by mouth 2 (two) times daily., Disp: , Rfl:  .  Rivaroxaban (XARELTO) 15 MG TABS tablet, Take 15 mg by mouth daily with supper., Disp: , Rfl:  .  rosuvastatin (CRESTOR) 20 MG tablet, Take 20 mg by mouth every evening.  , Disp: , Rfl:  .  torsemide  (DEMADEX) 20 MG tablet, Take 40 mg by mouth daily.  , Disp: , Rfl:  .  doxycycline (VIBRA-TABS) 100 MG tablet, Take 1 tablet (100 mg total) by mouth 2 (two) times daily., Disp: 20 tablet, Rfl: 0 .  metoprolol (TOPROL-XL) 200 MG 24 hr tablet, Take 200 mg by mouth daily.  , Disp: , Rfl:  .  mupirocin ointment (BACTROBAN) 2 %, Apply 1 application topically 2 (two) times daily., Disp: 30 g, Rfl: 2  Allergies  Allergen Reactions  . Zestoretic [Lisinopril-Hydrochlorothiazide] Itching and Rash    Social History   Socioeconomic History  . Marital status: Married    Spouse name: Not on file  . Number of children: Not on file  . Years of education: Not on file  . Highest education level: Not on file  Social Needs  . Financial resource strain: Not on file  . Food insecurity - worry: Not on file  . Food insecurity - inability: Not on file  . Transportation needs - medical: Not on file  . Transportation needs - non-medical: Not on file  Occupational History  . Not on file  Tobacco Use  . Smoking status: Never Smoker  . Smokeless tobacco: Former Systems developer    Types: Snuff  . Tobacco comment: "quit using snuff  in the 1960's"  Substance and Sexual Activity  . Alcohol use: No  . Drug use: No  . Sexual activity: No  Other Topics Concern  . Not on file  Social History Narrative  . Not on file         Objective:   Physical Exam  General: AAO x3, NAD  Dermatological: There is thick hyperkeratotic tissue to the dorsal left second toe.  Also appears to have a significant amount of dried blood.  This was easily removed today and there was a superficial granular wound present to the distal aspect of the toe but there is no drainage or purulence expressed.  There is no ascending cellulitis.  There is mild edema to the toe but no area of fluctuation or crepitation.  There is no malodor.  No other open lesions or pre-ulcerative lesions were identified today.      Vascular: Dorsalis Pedis artery  and Posterior Tibial artery pedal pulses are 2/4 bilateral with immedate capillary fill time.  There is no pain with calf compression, swelling, warmth, erythema.   Neruologic: Grossly intact via light touch bilateral. Protective threshold with Semmes Wienstein monofilament intact to all pedal sites bilateral.    Musculoskeletal: Hammertoes are present. No pain, crepitus, or limitation noted with foot and ankle range of motion bilateral. Muscular strength 5/5 in all groups tested bilateral.  Gait: Unassisted, Nonantalgic.      Assessment & Plan:  84 year old female with ulceration left second toe after having nail removed. -Treatment options discussed including all alternatives, risks, and complications -Etiology of symptoms were discussed -X-rays were obtained and reviewed with the patient.  There is no soft tissue emphysema present.  There is no definitive cortical destruction suggestive of osteomyelitis at this time.  Due to digital deformity is difficult to evaluate complete toe. -I was able to debride all the loose hyperkeratotic tissue and dried blood today without any complications or bleeding to reveal small superficial wound.  The wound was sharply debrided to healthy, granular tissue with #312 blade scalpel after verbal consent was obtained and the area was cleaned with alcohol.  I want her to continue with antibiotic ointment dressing changes daily. -Offloading. -Doxycycline prescribed. -Monitor for any clinical signs or symptoms of infection and directed to call the office immediately should any occur or go to the ER. -Follow-up in 1 week or sooner if needed.  Call with any questions or concerns meantime.  Trula Slade DPM

## 2017-07-12 ENCOUNTER — Ambulatory Visit (INDEPENDENT_AMBULATORY_CARE_PROVIDER_SITE_OTHER): Payer: Medicare Other | Admitting: Podiatry

## 2017-07-12 ENCOUNTER — Encounter: Payer: Self-pay | Admitting: Podiatry

## 2017-07-12 DIAGNOSIS — M2042 Other hammer toe(s) (acquired), left foot: Secondary | ICD-10-CM

## 2017-07-12 DIAGNOSIS — L97521 Non-pressure chronic ulcer of other part of left foot limited to breakdown of skin: Secondary | ICD-10-CM | POA: Diagnosis not present

## 2017-07-13 NOTE — Progress Notes (Signed)
Subjective: Shannon [name] presents the office today for follow-up evaluation of a wound to the left second toe.  She previously had nail avulsion performed couple months ago which did not heal.  She states that she is doing much better today and she is very happy.  She has finished antibiotics.  She denies any increase in swelling or redness and she denies any drainage or pus.  She denies any increase in swelling or redness to her feet as well. Denies any systemic complaints such as fevers, chills, nausea, vomiting. No acute changes since last appointment, and no other complaints at this time.   Objective: AAO x3, NAD DP/PT pulses palpable bilaterally, CRT less than 3 seconds There is a hyperkeratotic tissue to the distal portion of the left second toe.  Upon debridement there is a small superficial wound to the distal portion of the toe measuring 0.3 x 0.3 cm there is no probing, undermining or tunneling.  Minimal edema to the toe but there is no erythema or increase in warmth.  There is no ascending cellulitis.  No fluctuation or crepitation.  No malodor.  Hammertoe contractures present. No open lesions or pre-ulcerative lesions.  No pain with calf compression, swelling, warmth, erythema  Assessment: Healing wound left second toe  Plan: -All treatment options discussed with the patient including all alternatives, risks, complications.  -Debrided the hyperkeratotic tissue today without any complications or bleeding.  Underlying wound appears to be doing much better and overall she feels much better.  Continue the small amount of topical antibiotic ointment to the area daily.  We will hold off any further oral antibiotics at this time. -Ultimately she may need to have a procedure to help straighten the toe to take pressure off the distal portion.  We discussed possibly flexor tenotomy. -Monitor for any clinical signs or symptoms of infection and directed to call the office immediately should any occur or  go to the ER. -Patient encouraged to call the office with any questions, concerns, change in symptoms.   *x-ray next appointment if wound has not healed  Trula Slade DPM

## 2017-07-26 ENCOUNTER — Ambulatory Visit (INDEPENDENT_AMBULATORY_CARE_PROVIDER_SITE_OTHER): Payer: Medicare Other | Admitting: Podiatry

## 2017-07-26 ENCOUNTER — Encounter: Payer: Self-pay | Admitting: Podiatry

## 2017-07-26 ENCOUNTER — Ambulatory Visit (INDEPENDENT_AMBULATORY_CARE_PROVIDER_SITE_OTHER): Payer: Medicare Other

## 2017-07-26 DIAGNOSIS — M2042 Other hammer toe(s) (acquired), left foot: Secondary | ICD-10-CM

## 2017-07-26 DIAGNOSIS — L97521 Non-pressure chronic ulcer of other part of left foot limited to breakdown of skin: Secondary | ICD-10-CM | POA: Diagnosis not present

## 2017-08-03 NOTE — Progress Notes (Signed)
Subjective: 84 year old female presents the office today for follow-up evaluation of a wound to the distal aspect the left second toe after she had a nail avulsion performed.  She states the toe is looking much better she denies any drainage or pus.  She has no significant swelling increase that she has noticed and she denies any redness or red streaks.  She has no pain. Denies a any systemic complaints such as fevers, chills, nausea, vomiting. No acute changes since last appointment, and no other complaints at this time.   Objective: AAO x3, NAD DP/PT pulses palpable bilaterally, CRT less than 3 seconds Hammertoe contractures present of the second digit.  There is hyperkeratotic tissue to the distal aspect of the toe.  Upon debridement there is a small superficial wound present but there is no probing, undermining or tunneling.  There is no surrounding erythema, ascending sialitis.  Mild edema to the toe.  No fluctuation or crepitation.  There is no malodor. No open lesions or pre-ulcerative lesions.  No pain with calf compression, swelling, warmth, erythema  Assessment: Healing wound to the right second toe  Plan: -All treatment options discussed with the patient including all alternatives, risks, complications.  -I sharply debrided the wound to the right second toe to healthy, granular tissue and to remove all nonviable, hyperkeratotic tissue after verbal consent was obtained.  This was debrided with a 312 blade scalpel without any complications.  I want her to continue with daily dressing changes with a small amount of antibiotic ointment.  Continue offloading at all times. -Again discussed possible flexor tenotomy to help straighten the toe but she wishes to hold off any surgical intervention. -Monitor for any clinical signs or symptoms of infection and directed to call the office immediately should any occur or go to the ER. -Patient encouraged to call the office with any questions, concerns,  change in symptoms.   Trula Slade DPM

## 2017-08-04 DIAGNOSIS — M1711 Unilateral primary osteoarthritis, right knee: Secondary | ICD-10-CM | POA: Diagnosis not present

## 2017-08-09 ENCOUNTER — Ambulatory Visit (INDEPENDENT_AMBULATORY_CARE_PROVIDER_SITE_OTHER): Payer: Medicare Other | Admitting: Podiatry

## 2017-08-09 ENCOUNTER — Encounter: Payer: Self-pay | Admitting: Podiatry

## 2017-08-09 DIAGNOSIS — L97521 Non-pressure chronic ulcer of other part of left foot limited to breakdown of skin: Secondary | ICD-10-CM | POA: Diagnosis not present

## 2017-08-09 DIAGNOSIS — M2042 Other hammer toe(s) (acquired), left foot: Secondary | ICD-10-CM

## 2017-08-11 NOTE — Progress Notes (Signed)
Subjective: 83 year old female presents the office today for follow-up evaluation of a wound to the distal aspect the left second toe after she had a nail avulsion performed.  She states the wound of the toe is doing much better.  She feels that the toe is made great improvement since restarting.  She denies any drainage or pus and she has noticed that the swelling to the toe has improved.  Denies any redness or red streaks.  She has no other concerns today.  Denies a any systemic complaints such as fevers, chills, nausea, vomiting. No acute changes since last appointment, and no other complaints at this time.   Objective: AAO x3, NAD DP/PT pulses palpable bilaterally, CRT less than 3 seconds Hammertoe contractures present of the second digit.  There is hyperkeratotic tissue to the distal aspect of the toe however this is much improved today.  Upon debridement there is a small superficial wound present measuring approximately 0.2 x 0.2 cm there is no probing to bone, undermining or tunneling.  There is no surrounding erythema, ascending sialitis.  There is no fluctuation or crepitation.  There is no malodor. No open lesions or pre-ulcerative lesions.  No pain with calf compression, swelling, warmth, erythema  Assessment: Healing wound to the right second toe  Plan: -All treatment options discussed with the patient including all alternatives, risks, complications.  -I sharply debrided the wound to the right second toe to healthy, granular tissue and to remove all nonviable, hyperkeratotic tissue after verbal consent was obtained.  This was debrided with a 312 blade scalpel without any complications.  We will continue with daily dressing changes with a small amount of antibiotic ointment.  Continue offloading at all times. -Again discussed possible flexor tenotomy to help straighten the toe but she wishes to hold off any surgical intervention but will think about it. -Monitor for any clinical signs or  symptoms of infection and directed to call the office immediately should any occur or go to the ER. -Patient encouraged to call the office with any questions, concerns, change in symptoms.   Trula Slade DPM

## 2017-08-23 ENCOUNTER — Ambulatory Visit (INDEPENDENT_AMBULATORY_CARE_PROVIDER_SITE_OTHER): Payer: Medicare Other | Admitting: Podiatry

## 2017-08-23 DIAGNOSIS — Z7901 Long term (current) use of anticoagulants: Secondary | ICD-10-CM

## 2017-08-23 DIAGNOSIS — L84 Corns and callosities: Secondary | ICD-10-CM | POA: Diagnosis not present

## 2017-08-23 DIAGNOSIS — M2042 Other hammer toe(s) (acquired), left foot: Secondary | ICD-10-CM

## 2017-08-23 NOTE — Progress Notes (Signed)
Subjective: 84 year old female presents the office today for follow-up evaluation of a wound to the distal aspect the left second toe after she had a nail avulsion performed.  She states that she is doing much better today.  She feels that the area may be healed.  She denies any drainage or pus or the swelling to the toe is improved.  She is been wearing a buttress offloading pad which is been helping.  She also has 2 calluses on the right foot that she like to have trimmed as they are thick and causing discomfort.  Denies any redness or drainage to the area.  She has no other concerns today.   Denies any systemic complaints such as fevers, chills, nausea, vomiting. No acute changes since last appointment, and no other complaints at this time.   She is on Xarelto  Objective: AAO x3, NAD DP/PT pulses palpable bilaterally, CRT less than 3 seconds There is hyperkeratotic tissue to the distal aspect of the toe however this is much improved today.  Upon debridement the underlying ulceration appears to be healed but is still pre-ulcerative.  Decrease edema to the toe and there is no erythema or increase in warmth.  Hammertoe contracture still evident. Hyperkeratotic lesion right foot submetatarsal 1 and 5.  Upon debridement no underlying ulceration, drainage or any signs of infection noted today. No open lesions or other pre-ulcerative lesions.  No pain with calf compression, swelling, warmth, erythema  Assessment: Healed ulceration to the left 2nd toe; pre-ulcerative callus to the right foot x 2   Plan: -All treatment options discussed with the patient including all alternatives, risks, complications.  -I sharply debrided the wound to the callus to the right 2nd toe today without any complications or bleeding. The ulcer is healed but still pre-ulcerative.  Continue offloading at all times.  Continue with the buttress pads. -Sharply debrided the hyperkeratotic lesions right foot without any  complications or bleeding. -Monitor for any clinical signs or symptoms of infection and directed to call the office immediately should any occur or go to the ER. -Patient encouraged to call the office with any questions, concerns, change in symptoms.   Trula Slade DPM

## 2017-08-24 DIAGNOSIS — I129 Hypertensive chronic kidney disease with stage 1 through stage 4 chronic kidney disease, or unspecified chronic kidney disease: Secondary | ICD-10-CM | POA: Diagnosis not present

## 2017-08-24 DIAGNOSIS — Z8679 Personal history of other diseases of the circulatory system: Secondary | ICD-10-CM | POA: Diagnosis not present

## 2017-08-24 DIAGNOSIS — N183 Chronic kidney disease, stage 3 (moderate): Secondary | ICD-10-CM | POA: Diagnosis not present

## 2017-08-24 DIAGNOSIS — I5032 Chronic diastolic (congestive) heart failure: Secondary | ICD-10-CM | POA: Diagnosis not present

## 2017-09-14 ENCOUNTER — Ambulatory Visit (INDEPENDENT_AMBULATORY_CARE_PROVIDER_SITE_OTHER): Payer: Medicare Other | Admitting: Podiatry

## 2017-09-14 ENCOUNTER — Ambulatory Visit (INDEPENDENT_AMBULATORY_CARE_PROVIDER_SITE_OTHER): Payer: Medicare Other

## 2017-09-14 ENCOUNTER — Encounter: Payer: Self-pay | Admitting: Podiatry

## 2017-09-14 VITALS — BP 124/63 | HR 77 | Temp 96.7°F | Resp 18

## 2017-09-14 DIAGNOSIS — Z7901 Long term (current) use of anticoagulants: Secondary | ICD-10-CM | POA: Diagnosis not present

## 2017-09-14 DIAGNOSIS — L97521 Non-pressure chronic ulcer of other part of left foot limited to breakdown of skin: Secondary | ICD-10-CM

## 2017-09-14 DIAGNOSIS — M2042 Other hammer toe(s) (acquired), left foot: Secondary | ICD-10-CM

## 2017-09-14 MED ORDER — CEPHALEXIN 500 MG PO CAPS: 500.0000 mg | ORAL_CAPSULE | Freq: Three times a day (TID) | ORAL | 2 refills | Status: DC

## 2017-09-14 NOTE — Progress Notes (Signed)
Subjective: Shannon [name] presents the office today for concerns of continued and recurrent pain in the left second toe.  She states that someone needs to be tender this toe because is been causing a lot of pain.  She states that her to the top of the toe as well as towards the tip of the toe.  She denies any increase in swelling she denies any drainage or pus coming from the toe or any redness. Denies any systemic complaints such as fevers, chills, nausea, vomiting. No acute changes since last appointment, and no other complaints at this time.   Objective: AAO x3, NAD DP/PT pulses palpable bilaterally, CRT less than 3 seconds Rigid hammertoe contracture present to the left second toe and there is tenderness to palpation to the dorsal aspect of the digit as well as a thick hyperkeratotic lesion to the distal portion of the toe.  Upon debridement there is a superficial granular wound present to the distal portion of the toe. There is no probing to bone, undermining or tunneling.  There is no surrounding erythema, ascending cellulitis.  There is mild chronic edema but there is no erythema or increase in warmth No open lesions or pre-ulcerative lesions.  No pain with calf compression, swelling, warmth, erythema  Assessment: Rigid hammertoe contracture resulting in pain and chronic ulceration  Plan: -All treatment options discussed with the patient including all alternatives, risks, complications.  -X-rays were obtained today.  No evidence of acute fracture.  Hammertoe contractures present.  No definitive evidence of cortical destruction suggestive of osteomyelitis at this point. -We discussed both conservative as well as surgical treatment.  Unfortunately wound reopened given the pressure to the underlying deformity. I discussed with an arthroplasty of the PIPJ.  I discussed the surgery as well as the postoperative course.  She wished to proceed with surgery given her chronic pain to the toe as well as  ulceration.  Discussed this is not a guarantee resolution of symptoms and she is at risk for amputation. -The incision placement as well as the postoperative course was discussed with the patient. I discussed risks of the surgery which include, but not limited to, infection, bleeding, pain, swelling, need for further surgery, delayed or nonhealing, painful or ugly scar, numbness or sensation changes, over/under correction, recurrence, transfer lesions, further deformity, hardware failure, DVT/PE, loss of toe/foot. Patient understands these risks and wishes to proceed with surgery. The surgical consent was reviewed with the patient all 3 pages were signed. No promises or guarantees were given to the outcome of the procedure. All questions were answered to the best of my ability. Before the surgery the patient was encouraged to call the office if there is any further questions. The surgery will be performed at the [facility] on an outpatient basis. -Patient encouraged to call the office with any questions, concerns, change in symptoms.   Trula Slade DPM

## 2017-09-14 NOTE — Patient Instructions (Signed)

## 2017-09-16 ENCOUNTER — Encounter: Payer: Self-pay | Admitting: *Deleted

## 2017-09-20 ENCOUNTER — Ambulatory Visit (INDEPENDENT_AMBULATORY_CARE_PROVIDER_SITE_OTHER): Payer: Medicare Other | Admitting: Podiatry

## 2017-09-20 DIAGNOSIS — M2042 Other hammer toe(s) (acquired), left foot: Secondary | ICD-10-CM

## 2017-09-20 DIAGNOSIS — M79675 Pain in left toe(s): Secondary | ICD-10-CM | POA: Diagnosis not present

## 2017-09-20 DIAGNOSIS — L97521 Non-pressure chronic ulcer of other part of left foot limited to breakdown of skin: Secondary | ICD-10-CM | POA: Diagnosis not present

## 2017-09-20 DIAGNOSIS — B351 Tinea unguium: Secondary | ICD-10-CM | POA: Diagnosis not present

## 2017-09-20 DIAGNOSIS — Z7901 Long term (current) use of anticoagulants: Secondary | ICD-10-CM | POA: Diagnosis not present

## 2017-09-20 DIAGNOSIS — M79674 Pain in right toe(s): Secondary | ICD-10-CM | POA: Diagnosis not present

## 2017-09-21 ENCOUNTER — Telehealth: Payer: Self-pay | Admitting: *Deleted

## 2017-09-21 NOTE — Telephone Encounter (Signed)
I am calling to let you know Dr. Loura Halt you to have your surgery.  He said for you to stop taking the Xarelto two days prior to your surgery date.  He said you can start taking it again after your surgery.  "Okay, he said to stop taking it two days before and I can start taking it again after my surgery."  Yes, that is correct.

## 2017-09-22 DIAGNOSIS — Z961 Presence of intraocular lens: Secondary | ICD-10-CM | POA: Diagnosis not present

## 2017-09-22 DIAGNOSIS — H04123 Dry eye syndrome of bilateral lacrimal glands: Secondary | ICD-10-CM | POA: Diagnosis not present

## 2017-09-22 DIAGNOSIS — H401121 Primary open-angle glaucoma, left eye, mild stage: Secondary | ICD-10-CM | POA: Diagnosis not present

## 2017-09-22 DIAGNOSIS — H401113 Primary open-angle glaucoma, right eye, severe stage: Secondary | ICD-10-CM | POA: Diagnosis not present

## 2017-09-23 ENCOUNTER — Telehealth: Payer: Self-pay | Admitting: *Deleted

## 2017-09-23 ENCOUNTER — Other Ambulatory Visit: Payer: Self-pay | Admitting: Podiatry

## 2017-09-23 DIAGNOSIS — R0989 Other specified symptoms and signs involving the circulatory and respiratory systems: Secondary | ICD-10-CM

## 2017-09-23 NOTE — Telephone Encounter (Signed)
I am calling to let you know that Dr. Jacqualyn Posey wants you to have a doppler study done before you have surgery to make sure you have adequate blood flow to your toes.  "Somebody has already called me to schedule that.  I'm scheduled for Monday at 2:30 pm."  Okay great, I was trying to call you and let you know this morning.  I apologize.  "You are fine."

## 2017-09-23 NOTE — Telephone Encounter (Signed)
-----   Message from Trula Slade, DPM sent at 09/23/2017  7:18 AM EDT ----- Can you please order arterial study (ABI with TBI)? Can it be done before next Wednesday? I would like to get a toe pressure to the left 2nd toe specifically. Thanks.

## 2017-09-23 NOTE — Progress Notes (Signed)
Subjective: Shannon [name] presents to the office today for follow-up evaluation of a wound to the distal aspect of the left second toe as well as very thick, elongated toenails that should have trimmed today.  She denies any redness or drainage from the toenail sites the nails are causing pain with pressure issues.  She states the pain to the second toe was the distal portion of the toe is improving however wound does continue.  She is been given antibiotic ointment over the area daily.  She is scheduled to have a hammertoe repair next week. Denies any systemic complaints such as fevers, chills, nausea, vomiting. No acute changes since last appointment, and no other complaints at this time.   Objective: AAO x3, NAD DP/PT pulses palpable bilaterally, CRT less than 3 seconds Patient hammertoe contractures present digits most of the left second toe resulted in an ulceration of the distal portion of the toe.  Hyperkeratotic lesion to the distal portion of the toe and upon opening there is a small superficial wound measuring 0.2 x 0.2 cm today and there is no probing to bone, undermining or tunneling.  There is no surrounding erythema, ascending cellulitis.  There is no fluctuation or crepitation.  There is no malodor.  No clinical signs of infection present today there is decrease edema to the toe. Nails are hypertrophic, dystrophic, brittle, discolored, elongated 9. No surrounding redness or drainage. Tenderness nails 1-5 bilaterally except for the left second toe. No open lesions or pre-ulcerative lesions are identified today. No open lesions or pre-ulcerative lesions.  No pain with calf compression, swelling, warmth, erythema  Assessment: Symptomatic onychomycosis, ulceration due to underlying hammertoe deformity  Plan: -All treatment options discussed with the patient including all alternatives, risks, complications.  -Nails sharply debrided x9 without any complications or bleeding -I debrided the wound to  the left second toe to the any complications or bleeding.  Again we discussed hammertoe repair in order to help take pressure off the distal portion of the toe.  She is scheduled for next week.  We will order an ABI with a TBI to ensure healing although she does have the wound heal before and she apparently had toenail removed which did heal. -Monitor for any clinical signs or symptoms of infection and directed to call the office immediately should any occur or go to the ER. -Patient encouraged to call the office with any questions, concerns, change in symptoms.   Trula Slade DPM

## 2017-09-23 NOTE — Progress Notes (Unsigned)
Surgery scheduled for 09/29/2017.  Request for lower arterial doppler with ABIs and TBIs is needed prior to the procedure

## 2017-09-23 NOTE — Telephone Encounter (Signed)
Left message Shannon [name] - [facility] that Dr. Jacqualyn Posey needed dopplers prior to 09/29/2017 for surgical planning and specifically left 2nd toe.

## 2017-09-24 ENCOUNTER — Emergency Department (HOSPITAL_COMMUNITY): Payer: Medicare Other

## 2017-09-24 ENCOUNTER — Other Ambulatory Visit: Payer: Self-pay

## 2017-09-24 ENCOUNTER — Telehealth: Payer: Self-pay | Admitting: *Deleted

## 2017-09-24 ENCOUNTER — Encounter (HOSPITAL_COMMUNITY): Payer: Self-pay

## 2017-09-24 ENCOUNTER — Inpatient Hospital Stay (HOSPITAL_COMMUNITY)
Admission: EM | Admit: 2017-09-24 | Discharge: 2017-09-26 | DRG: 291 | Disposition: A | Discharge status: Home / Self Care | Payer: Medicare Other | Attending: Internal Medicine | Admitting: Internal Medicine

## 2017-09-24 ENCOUNTER — Other Ambulatory Visit (HOSPITAL_COMMUNITY): Payer: Medicare Other

## 2017-09-24 DIAGNOSIS — I440 Atrioventricular block, first degree: Secondary | ICD-10-CM | POA: Diagnosis present

## 2017-09-24 DIAGNOSIS — I5023 Acute on chronic systolic (congestive) heart failure: Secondary | ICD-10-CM | POA: Diagnosis not present

## 2017-09-24 DIAGNOSIS — I340 Nonrheumatic mitral (valve) insufficiency: Secondary | ICD-10-CM | POA: Diagnosis present

## 2017-09-24 DIAGNOSIS — I509 Heart failure, unspecified: Secondary | ICD-10-CM

## 2017-09-24 DIAGNOSIS — I130 Hypertensive heart and chronic kidney disease with heart failure and stage 1 through stage 4 chronic kidney disease, or unspecified chronic kidney disease: Principal | ICD-10-CM | POA: Diagnosis present

## 2017-09-24 DIAGNOSIS — I739 Peripheral vascular disease, unspecified: Secondary | ICD-10-CM | POA: Diagnosis present

## 2017-09-24 DIAGNOSIS — I701 Atherosclerosis of renal artery: Secondary | ICD-10-CM | POA: Diagnosis present

## 2017-09-24 DIAGNOSIS — M2042 Other hammer toe(s) (acquired), left foot: Secondary | ICD-10-CM | POA: Diagnosis present

## 2017-09-24 DIAGNOSIS — R739 Hyperglycemia, unspecified: Secondary | ICD-10-CM | POA: Diagnosis present

## 2017-09-24 DIAGNOSIS — I272 Pulmonary hypertension, unspecified: Secondary | ICD-10-CM | POA: Diagnosis present

## 2017-09-24 DIAGNOSIS — Z888 Allergy status to other drugs, medicaments and biological substances status: Secondary | ICD-10-CM | POA: Diagnosis not present

## 2017-09-24 DIAGNOSIS — I110 Hypertensive heart disease with heart failure: Secondary | ICD-10-CM | POA: Diagnosis not present

## 2017-09-24 DIAGNOSIS — R0602 Shortness of breath: Secondary | ICD-10-CM | POA: Diagnosis not present

## 2017-09-24 DIAGNOSIS — Z9841 Cataract extraction status, right eye: Secondary | ICD-10-CM | POA: Diagnosis not present

## 2017-09-24 DIAGNOSIS — I5020 Unspecified systolic (congestive) heart failure: Secondary | ICD-10-CM

## 2017-09-24 DIAGNOSIS — E662 Morbid (severe) obesity with alveolar hypoventilation: Secondary | ICD-10-CM | POA: Diagnosis present

## 2017-09-24 DIAGNOSIS — M1990 Unspecified osteoarthritis, unspecified site: Secondary | ICD-10-CM | POA: Diagnosis present

## 2017-09-24 DIAGNOSIS — I5032 Chronic diastolic (congestive) heart failure: Secondary | ICD-10-CM

## 2017-09-24 DIAGNOSIS — L89894 Pressure ulcer of other site, stage 4: Secondary | ICD-10-CM | POA: Diagnosis present

## 2017-09-24 DIAGNOSIS — I5033 Acute on chronic diastolic (congestive) heart failure: Secondary | ICD-10-CM | POA: Diagnosis present

## 2017-09-24 DIAGNOSIS — N183 Chronic kidney disease, stage 3 (moderate): Secondary | ICD-10-CM | POA: Diagnosis present

## 2017-09-24 DIAGNOSIS — I10 Essential (primary) hypertension: Secondary | ICD-10-CM

## 2017-09-24 DIAGNOSIS — E876 Hypokalemia: Secondary | ICD-10-CM | POA: Diagnosis present

## 2017-09-24 DIAGNOSIS — R0902 Hypoxemia: Secondary | ICD-10-CM

## 2017-09-24 DIAGNOSIS — Z6835 Body mass index (BMI) 35.0-35.9, adult: Secondary | ICD-10-CM | POA: Diagnosis not present

## 2017-09-24 DIAGNOSIS — Z9111 Patient's noncompliance with dietary regimen: Secondary | ICD-10-CM | POA: Diagnosis not present

## 2017-09-24 DIAGNOSIS — Z9842 Cataract extraction status, left eye: Secondary | ICD-10-CM

## 2017-09-24 DIAGNOSIS — J9601 Acute respiratory failure with hypoxia: Secondary | ICD-10-CM

## 2017-09-24 DIAGNOSIS — E785 Hyperlipidemia, unspecified: Secondary | ICD-10-CM | POA: Diagnosis present

## 2017-09-24 DIAGNOSIS — M109 Gout, unspecified: Secondary | ICD-10-CM

## 2017-09-24 DIAGNOSIS — I480 Paroxysmal atrial fibrillation: Secondary | ICD-10-CM | POA: Diagnosis present

## 2017-09-24 DIAGNOSIS — T502X5A Adverse effect of carbonic-anhydrase inhibitors, benzothiadiazides and other diuretics, initial encounter: Secondary | ICD-10-CM | POA: Diagnosis present

## 2017-09-24 DIAGNOSIS — Z7901 Long term (current) use of anticoagulants: Secondary | ICD-10-CM | POA: Diagnosis not present

## 2017-09-24 DIAGNOSIS — R079 Chest pain, unspecified: Secondary | ICD-10-CM | POA: Diagnosis not present

## 2017-09-24 DIAGNOSIS — I150 Renovascular hypertension: Secondary | ICD-10-CM | POA: Diagnosis present

## 2017-09-24 DIAGNOSIS — I5021 Acute systolic (congestive) heart failure: Secondary | ICD-10-CM | POA: Diagnosis not present

## 2017-09-24 DIAGNOSIS — I517 Cardiomegaly: Secondary | ICD-10-CM | POA: Diagnosis not present

## 2017-09-24 HISTORY — DX: Chronic kidney disease, unspecified: N18.9

## 2017-09-24 HISTORY — DX: Dyspnea, unspecified: R06.00

## 2017-09-24 HISTORY — DX: Heart failure, unspecified: I50.9

## 2017-09-24 HISTORY — DX: Chronic diastolic (congestive) heart failure: I50.32

## 2017-09-24 HISTORY — DX: Unspecified atrial fibrillation: I48.91

## 2017-09-24 LAB — I-STAT TROPONIN, ED: TROPONIN I, POC: 0.01 ng/mL (ref 0.00–0.08)

## 2017-09-24 LAB — LACTIC ACID, PLASMA
LACTIC ACID, VENOUS: 1.3 mmol/L (ref 0.5–1.9)
LACTIC ACID, VENOUS: 1.3 mmol/L (ref 0.5–1.9)

## 2017-09-24 LAB — TROPONIN I
TROPONIN I: 0.07 ng/mL — AB (ref <0.03)
Troponin I: 0.05 ng/mL (ref <0.03)
Troponin I: 0.07 ng/mL (ref <0.03)

## 2017-09-24 LAB — URINALYSIS, ROUTINE W REFLEX MICROSCOPIC
BILIRUBIN URINE: NEGATIVE
Bacteria, UA: NONE SEEN
GLUCOSE, UA: NEGATIVE mg/dL
Ketones, ur: NEGATIVE mg/dL
Leukocytes, UA: NEGATIVE
NITRITE: NEGATIVE
PH: 7 (ref 5.0–8.0)
Protein, ur: NEGATIVE mg/dL
SPECIFIC GRAVITY, URINE: 1.006 (ref 1.005–1.030)
WBC UA: NONE SEEN WBC/hpf (ref 0–5)

## 2017-09-24 LAB — I-STAT CG4 LACTIC ACID, ED
LACTIC ACID, VENOUS: 1.27 mmol/L (ref 0.5–1.9)
Lactic Acid, Venous: 2.7 mmol/L (ref 0.5–1.9)

## 2017-09-24 LAB — BASIC METABOLIC PANEL
Anion gap: 11 (ref 5–15)
BUN: 25 mg/dL — ABNORMAL HIGH (ref 6–20)
CHLORIDE: 108 mmol/L (ref 101–111)
CO2: 22 mmol/L (ref 22–32)
Calcium: 8.9 mg/dL (ref 8.9–10.3)
Creatinine, Ser: 1.43 mg/dL — ABNORMAL HIGH (ref 0.44–1.00)
GFR calc Af Amer: 38 mL/min — ABNORMAL LOW (ref >60)
GFR calc non Af Amer: 32 mL/min — ABNORMAL LOW (ref >60)
GLUCOSE: 207 mg/dL — AB (ref 65–99)
POTASSIUM: 3.8 mmol/L (ref 3.5–5.1)
Sodium: 141 mmol/L (ref 135–145)

## 2017-09-24 LAB — T4, FREE: FREE T4: 0.86 ng/dL (ref 0.61–1.12)

## 2017-09-24 LAB — HEMOGLOBIN A1C
Hgb A1c MFr Bld: 5.5 % (ref 4.8–5.6)
Mean Plasma Glucose: 111.15 mg/dL

## 2017-09-24 LAB — CBC
HEMATOCRIT: 42.6 % (ref 36.0–46.0)
HEMOGLOBIN: 13.5 g/dL (ref 12.0–15.0)
MCH: 30.6 pg (ref 26.0–34.0)
MCHC: 31.7 g/dL (ref 30.0–36.0)
MCV: 96.6 fL (ref 78.0–100.0)
PLATELETS: 238 10*3/uL (ref 150–400)
RBC: 4.41 MIL/uL (ref 3.87–5.11)
RDW: 14.8 % (ref 11.5–15.5)
WBC: 7.7 10*3/uL (ref 4.0–10.5)

## 2017-09-24 LAB — BRAIN NATRIURETIC PEPTIDE: B Natriuretic Peptide: 502.3 pg/mL — ABNORMAL HIGH (ref 0.0–100.0)

## 2017-09-24 LAB — TSH: TSH: 4.464 u[IU]/mL (ref 0.350–4.500)

## 2017-09-24 MED ORDER — FEBUXOSTAT 40 MG PO TABS
80.0000 mg | ORAL_TABLET | ORAL | Status: DC
  Administered 2017-09-24 – 2017-09-26 (×3): 80 mg via ORAL
  Filled 2017-09-24 (×5): qty 2

## 2017-09-24 MED ORDER — HYDROCODONE-ACETAMINOPHEN 5-325 MG PO TABS: 1.0000 | ORAL_TABLET | ORAL | Status: DC | PRN

## 2017-09-24 MED ORDER — ONDANSETRON HCL 4 MG PO TABS: 4.0000 mg | ORAL_TABLET | Freq: Four times a day (QID) | ORAL | Status: DC | PRN

## 2017-09-24 MED ORDER — ACETAMINOPHEN 650 MG RE SUPP: 650.0000 mg | Freq: Four times a day (QID) | RECTAL | Status: DC | PRN

## 2017-09-24 MED ORDER — ACETAMINOPHEN 325 MG PO TABS: 650.0000 mg | ORAL_TABLET | Freq: Four times a day (QID) | ORAL | Status: DC | PRN

## 2017-09-24 MED ORDER — AMLODIPINE BESYLATE 10 MG PO TABS
10.0000 mg | ORAL_TABLET | Freq: Every day | ORAL | Status: DC
  Administered 2017-09-24 – 2017-09-26 (×3): 10 mg via ORAL
  Filled 2017-09-24: qty 1
  Filled 2017-09-24 (×2): qty 2

## 2017-09-24 MED ORDER — NITROGLYCERIN IN D5W 200-5 MCG/ML-% IV SOLN
0.0000 ug/min | Freq: Once | INTRAVENOUS | Status: AC
  Administered 2017-09-24: 5 ug/min via INTRAVENOUS
  Filled 2017-09-24: qty 250

## 2017-09-24 MED ORDER — BISACODYL 10 MG RE SUPP: 10.0000 mg | Freq: Every day | RECTAL | Status: DC | PRN

## 2017-09-24 MED ORDER — RIVAROXABAN 15 MG PO TABS
15.0000 mg | ORAL_TABLET | Freq: Every day | ORAL | Status: DC
  Administered 2017-09-24 – 2017-09-25 (×2): 15 mg via ORAL
  Filled 2017-09-24 (×2): qty 1

## 2017-09-24 MED ORDER — SENNOSIDES-DOCUSATE SODIUM 8.6-50 MG PO TABS: 1.0000 | ORAL_TABLET | Freq: Every evening | ORAL | Status: DC | PRN

## 2017-09-24 MED ORDER — ROSUVASTATIN CALCIUM 20 MG PO TABS
20.0000 mg | ORAL_TABLET | Freq: Every evening | ORAL | Status: DC
  Administered 2017-09-24 – 2017-09-25 (×2): 20 mg via ORAL
  Filled 2017-09-24 (×2): qty 1

## 2017-09-24 MED ORDER — PINDOLOL 5 MG PO TABS
5.0000 mg | ORAL_TABLET | Freq: Two times a day (BID) | ORAL | Status: DC
  Administered 2017-09-25 – 2017-09-26 (×2): 5 mg via ORAL
  Filled 2017-09-24 (×4): qty 1

## 2017-09-24 MED ORDER — IRBESARTAN 150 MG PO TABS
150.0000 mg | ORAL_TABLET | Freq: Every day | ORAL | Status: DC
  Administered 2017-09-24 – 2017-09-26 (×3): 150 mg via ORAL
  Filled 2017-09-24 (×4): qty 1

## 2017-09-24 MED ORDER — CEPHALEXIN 250 MG PO CAPS: 500.0000 mg | ORAL_CAPSULE | Freq: Three times a day (TID) | ORAL | Status: DC

## 2017-09-24 MED ORDER — FUROSEMIDE 10 MG/ML IJ SOLN
60.0000 mg | Freq: Three times a day (TID) | INTRAMUSCULAR | Status: AC
  Administered 2017-09-25 – 2017-09-26 (×4): 60 mg via INTRAVENOUS
  Filled 2017-09-24 (×4): qty 6

## 2017-09-24 MED ORDER — ASPIRIN EC 325 MG PO TBEC: 325.0000 mg | DELAYED_RELEASE_TABLET | Freq: Every day | ORAL | Status: DC

## 2017-09-24 MED ORDER — FUROSEMIDE 10 MG/ML IJ SOLN: 60.0000 mg | Freq: Two times a day (BID) | INTRAMUSCULAR | Status: DC

## 2017-09-24 MED ORDER — PINDOLOL 5 MG PO TABS
5.0000 mg | ORAL_TABLET | Freq: Two times a day (BID) | ORAL | Status: DC
  Administered 2017-09-24: 5 mg via ORAL
  Filled 2017-09-24 (×2): qty 1

## 2017-09-24 MED ORDER — FUROSEMIDE 10 MG/ML IJ SOLN
60.0000 mg | Freq: Once | INTRAMUSCULAR | Status: AC
  Administered 2017-09-24: 60 mg via INTRAVENOUS
  Filled 2017-09-24: qty 6

## 2017-09-24 MED ORDER — MUPIROCIN 2 % EX OINT: 1.0000 "application " | TOPICAL_OINTMENT | Freq: Two times a day (BID) | CUTANEOUS | Status: DC

## 2017-09-24 MED ORDER — ONDANSETRON HCL 4 MG/2ML IJ SOLN: 4.0000 mg | Freq: Four times a day (QID) | INTRAMUSCULAR | Status: DC | PRN

## 2017-09-24 NOTE — Telephone Encounter (Signed)
Dr. Shawn Route states pt has been admitted into the hospital with symptoms of Congestive Heart Failure. I informed Dr. Shawn Route that if they needed Dr. Jacqualyn Posey to see pt in [facility], to put in a Epic Referral.

## 2017-09-24 NOTE — ED Triage Notes (Signed)
Pt woke her husband up 30 minutes ago stating she was SOB, denies CP, pt very diaphoretic, accessory muscle use, o2 stats 60's on non-re breather

## 2017-09-24 NOTE — Consult Note (Signed)
WOC consulted for toe wound, followed by Dr. Jacqualyn Posey.  Plans for amputation 09/29/17.  Family aware no further topical care recommended at this time.  [place] Nurse wound consult note Reason for Consult: left toe necrosis Wound type: full thickness toe ulceration left 2nd toe, hammertoe Pressure Injury POA:  Wound bed: small less than 0.2cm area of eschar, does not probe Drainage (amount, consistency, odor) none, no dressing in place  Periwound: edema, mild  Dressing procedure/placement/frequency:  No need for topical care. Noted that Dr. Leigh [place] office aware of her admission.   Re consult if needed, will not follow at this time. Thanks  Jaksen Fiorella [employer], RN,CWOCN, CNS, [place] [phone])

## 2017-09-24 NOTE — Telephone Encounter (Signed)
I left Shannon [name] a message to cancel Shannon [name]'s surgery for 09/29/2017 with Dr. Jacqualyn Posey.  Patient has been admitted into the hospital for Congestive Heart Failure.

## 2017-09-24 NOTE — ED Notes (Signed)
Pt resting, NTG gtt remains at 50mcg.   Family at bedside

## 2017-09-24 NOTE — ED Notes (Signed)
Pt eating turkey sandwich

## 2017-09-24 NOTE — ED Notes (Signed)
Tolerating BiPap well - family at bedside - plan of care discussed - pt and family members verbalized understanding

## 2017-09-24 NOTE — Consult Note (Addendum)
CARDIOLOGY CONSULT NOTE  Patient ID: Shannon [name] MRN: [id number] DOB/AGE: 09/28/1930 84 y.o.  Admit date: 09/24/2017 Referring Physician  Reyne Dumas, MD Primary Physician:  [name], MD Reason for Consultation  Acute pulmonary edema  HPI: Shannon [name]  is a 84 y.o. female  With history of paroxysmal atrial fibrillation, resistant hypertension with no evidence of renal artery stenosis by renal angiography in 2016, stage III chronic kidney disease, moderate obesity, history of gout and hyperlipidemia, had been doing well until about a week ago when she started noticing gradual onset of shortness of breath and leg edema.  She thought that she would feel better, until this morning when she woke up she had extreme difficulty in breathing and thought that she was going to die.  She was brought to the emergency room emergently where she was placed on BiPAP for respiratory support and acute respiratory distress and pulmonary edema and received intravenous furosemide, with improvement in respiratory status.  She is now admitted to the hospital for further management and evaluation.  Her family members are present at the bedside.  Patient states that except for dyspnea she has not noticed any chest pain or palpitations.  She has not had any bleeding diathesis on Xarelto.  Past Medical History:  Diagnosis Date  . Arthritis    "all over"  . Atrial fibrillation ([place])   . Chronic kidney disease   . Dyspnea   . Gout   . Hypercholesteremia   . Hypertension   . Mitral regurgitation      Past Surgical History:  Procedure Laterality Date  . BREAST BIOPSY Left 1984  . CARDIOVERSION N/A 10/27/2016   Procedure: CARDIOVERSION;  Surgeon: Adrian Prows, MD;  Location: [facility];  Service: Cardiovascular;  Laterality: N/A;  . CATARACT EXTRACTION, BILATERAL Bilateral 2000's  . RENAL ANGIOGRAM Bilateral 07/31/2014   Procedure: RENAL ANGIOGRAM;  Surgeon: Laverda Page, MD;  Location: [facility] CATH LAB;   Service: Cardiovascular;  Laterality: Bilateral;     Family History  Problem Relation Age of Onset  . Breast cancer Neg Hx      Social History: Social History   Socioeconomic History  . Marital status: Married    Spouse name: Not on file  . Number of children: Not on file  . Years of education: Not on file  . Highest education level: Not on file  Occupational History  . Not on file  Social Needs  . Financial resource strain: Not on file  . Food insecurity:    Worry: Not on file    Inability: Not on file  . Transportation needs:    Medical: Not on file    Non-medical: Not on file  Tobacco Use  . Smoking status: Never Smoker  . Smokeless tobacco: Former Systems developer    Types: Snuff  . Tobacco comment: "quit using snuff in the 1960's"  Substance and Sexual Activity  . Alcohol use: No  . Drug use: No  . Sexual activity: Never  Lifestyle  . Physical activity:    Days per week: Not on file    Minutes per session: Not on file  . Stress: Not on file  Relationships  . Social connections:    Talks on phone: Not on file    Gets together: Not on file    Attends religious service: Not on file    Active member of club or organization: Not on file    Attends meetings of clubs or organizations: Not on file  Relationship status: Not on file  . Intimate partner violence:    Fear of current or ex partner: Not on file    Emotionally abused: Not on file    Physically abused: Not on file    Forced sexual activity: Not on file  Other Topics Concern  . Not on file  Social History Narrative  . Not on file    Review of Systems  Constitutional: Negative.   HENT: Negative.  Negative for hearing loss.   Eyes: Negative.   Respiratory: Positive for shortness of breath and wheezing. Negative for cough and hemoptysis.   Cardiovascular: Positive for orthopnea, leg swelling and PND. Negative for chest pain, palpitations and claudication.  Gastrointestinal: Negative.   Genitourinary:  Negative.   Musculoskeletal: Negative.   Skin: Negative.   Neurological: Negative.   Endo/Heme/Allergies: Negative.   Psychiatric/Behavioral: Negative.    97.4 F, 81 bpm, regular, respiratory rate 23 bpm, blood pressure 152/72 mmHg.  Physical Exam  Constitutional: She is oriented to person, place, and time.  Body mass index is 34.93 kg/m.  She is well built and moderately obese presently in no acute distress.  Mildly dyspneic.  HENT:  Head: Atraumatic.  Eyes: Conjunctivae are normal.  Neck: JVD present.  Cardiovascular: Normal rate, regular rhythm and intact distal pulses. Exam reveals gallop (S4). Exam reveals no friction rub.  No murmur heard. 2+ pitting bilateral below knee leg edema  Pulmonary/Chest: No stridor.  Mild increased respiratory rate without acute distress.  Faint bibasilar crackles heard.  Abdominal: Soft. Bowel sounds are normal. She exhibits distension. There is no tenderness. There is no rebound and no guarding.  Obese and pannus present.  Appears distended.  No obvious organomegaly.  Examination limited due to obesity.  Musculoskeletal: Normal range of motion.  Neurological: She is alert and oriented to person, place, and time.  Skin: Skin is warm and dry.  Psychiatric: Affect normal.  Labs:  Lab Results  Component Value Date   WBC 7.7 09/24/2017   HGB 13.5 09/24/2017   HCT 42.6 09/24/2017   MCV 96.6 09/24/2017   PLT 238 09/24/2017   Recent Labs  Lab 09/24/17 0705  NA 141  K 3.8  CL 108  CO2 22  BUN 25*  CREATININE 1.43*  CALCIUM 8.9  GLUCOSE 207*    BNP (last 3 results) Recent Labs    09/24/17 0705  BNP 502.3*    HEMOGLOBIN A1C Lab Results  Component Value Date   HGBA1C 5.5 09/24/2017   MPG 111.15 09/24/2017    Cardiac Panel (last 3 results) Recent Labs    09/24/17 1024 09/24/17 1520  TROPONINI 0.05* 0.07*    Lab Results  Component Value Date   TROPONINI 0.07 (HH) 09/24/2017     TSH Recent Labs    09/24/17 0830  TSH  4.464     Radiology: Dg Chest Portable 1 View  Result Date: 09/24/2017 CLINICAL DATA:  Shortness of breath.  Chest pain EXAM: PORTABLE CHEST 1 VIEW COMPARISON:  02/03/2010. FINDINGS: Cardiomegaly with pulmonary vascular prominence and diffuse bilateral pulmonary infiltrates with bilateral pleural effusions. Findings consistent with CHF. Degenerative changes both shoulders and thoracic spine. IMPRESSION: Congestive heart failure with bilateral pulmonary edema and bilateral pleural effusions. Electronically Signed   By: Marcello Moores  Register   On: 09/24/2017 07:39    Scheduled Meds: . febuxostat  80 mg Oral BH-q7a  . furosemide  60 mg Intravenous BID  . pindolol  5 mg Oral BID  . Rivaroxaban  15 mg  Oral Q supper  . rosuvastatin  20 mg Oral QPM   Continuous Infusions: PRN Meds:.acetaminophen **OR** acetaminophen, bisacodyl, HYDROcodone-acetaminophen, ondansetron **OR** ondansetron (ZOFRAN) IV, senna-docusate  CARDIAC STUDIES:   EKG 09/24/2017: Sinus rhythm with first-degree AV block at rate of 85 bpm, left atrial enlargement, normal axis.  Poor R wave progression, cannot exclude anteroseptal infarct old.  LVH with repolarization abnormality.  Compared to the EKG done on 07/31/2014, T wave inversion in lateral lead is new.  Voltage has increased.  Lexiscan Myoview stress test 10/12/2016: Resting EKG demonstrates atrial fibrillation nonspecific T abnormality.  Stress EKG nondiagnostic.  Normal perfusion without evidence of ischemia or scar.  EF 41% and may be inaccurate due to difficulty with gating.  Echocardiogram 10/27/2016: Normal LVEF at 55-60%.  No significant valvular abnormality.  Renal angiography 07/31/2014: Moderate disease in the right renal artery of 30-40%, does not appear to be high-grade.  Mild to moderate disease in the left renal artery.  Renal artery duplex 06/25/2014: Right renal artery appears to have significant stenosis.  Scheduled Meds: . febuxostat  80 mg Oral BH-q7a   . furosemide  60 mg Intravenous BID  . pindolol  5 mg Oral BID  . Rivaroxaban  15 mg Oral Q supper  . rosuvastatin  20 mg Oral QPM   Continuous Infusions: PRN Meds:.acetaminophen **OR** acetaminophen, bisacodyl, HYDROcodone-acetaminophen, ondansetron **OR** ondansetron (ZOFRAN) IV, senna-docusate   ASSESSMENT AND PLAN:   1.  Acute pulmonary edema secondary to acute on chronic diastolic heart failure. 2.  Resistant hypertension with mild renal artery atherosclerosis without significant stenosis, probably related to obesity and poor lifestyle. 3.  Paroxysmal atrial fibrillation presently on Xarelto CHA2DS2-VASCScore: Risk Score  5,  Yearly risk of stroke  6.7. 4.  Hypertensive kidney disease, stage III.  Serum creatinine has remained stable from prior lab evaluation in the outpatient setting, labs on 10/20/2016 had revealed serum creatinine of 1.78, EGFR 30 mL.  Recommendation: Patient was on amlodipine/valsartan combination which should be back on the ARB which she has been tolerating well.  Continue IV diuresis, patient previously on torsemide.  She did not tolerate spironolactone in the past, developed acute renal failure.  Hence avoid spironolactone.  Continue anticoagulation with Xarelto.  She is maintaining sinus rhythm with PACs.  Her respiratory status is now improved.  I spent additional 15 minutes with the family members and the patient discussing regarding signs and symptoms of congestive heart failure, went to seek attention and also weight loss and dietary changes that need to be made.  I suspect her uncontrolled hypertension led to her CHF along with poor eating habits.  I will continue to follow.  Increase furosemide to 3 times daily dosing.  Adrian Prows, MD 09/24/2017, 5:47 PM [facility] [place] Pager: [phone] Office: [phone] If no answer Cell [phone]

## 2017-09-24 NOTE — ED Notes (Signed)
Family at bedside. Awaiting lab results and ECHO to be performed - pt and family advised of same

## 2017-09-24 NOTE — ED Notes (Signed)
Currently off BiPap - resp even, nonlabored; o2 sat 98% on 3L o2/[place] - tolerating well; sitting upright on stretcher without complaints - family at bedside; awaiting inpt bed assignment - pt and family aware of same

## 2017-09-24 NOTE — ED Notes (Signed)
PCXR done

## 2017-09-24 NOTE — ED Notes (Signed)
Addendum to initial assessment: necrosis noted to distal aspect of left second toe; no dressing present - no drainage noted; plastic toe protector present to plantar and lateral aspects of toe; pt reports is scheduled for sx on 09/29/17 for amputation of distal aspect of same toe - reports has been on course of unk abx for same - has not been completed

## 2017-09-24 NOTE — ED Provider Notes (Signed)
[facility] EMERGENCY DEPARTMENT Provider Note   CSN: [id number] Arrival date & time: 09/24/17  0654     History   Chief Complaint Chief Complaint  Patient presents with  . Respiratory Distress    HPI JACOBY RITSEMA is a 84 y.o. female.  84 year old female with history of atrial fibrillation presents with acute shortness of breath which began this morning when she woke up.  Denies any chest pain.  Denies any prior history of CHF.  Some nonproductive cough is noted but no recent fever or chills.  Has had recent swelling in her lower extremities.  They were exacerbated by walking and nothing makes them better and no treatment used prior to arrival.  Her symptoms became progressively worse and her husband drove her to the ED.  She presents at this time extremely dyspneic     Past Medical History:  Diagnosis Date  . Arthritis    "all over"  . Gout   . Hypercholesteremia   . Hypertension   . Mitral regurgitation     Patient Active Problem List   Diagnosis Date Noted  . Atrial fibrillation ([place]) 10/25/2016  . Renal arterial hypertension 07/31/2014  . Renovascular hypertension 07/30/2014    Past Surgical History:  Procedure Laterality Date  . BREAST BIOPSY Left 1984  . CARDIOVERSION N/A 10/27/2016   Procedure: CARDIOVERSION;  Surgeon: Adrian Prows, MD;  Location: [facility];  Service: Cardiovascular;  Laterality: N/A;  . CATARACT EXTRACTION, BILATERAL Bilateral 2000's  . RENAL ANGIOGRAM Bilateral 07/31/2014   Procedure: RENAL ANGIOGRAM;  Surgeon: Laverda Page, MD;  Location: [facility] CATH LAB;  Service: Cardiovascular;  Laterality: Bilateral;     OB History   None      Home Medications    Prior to Admission medications   Medication Sig Start Date End Date Taking? Authorizing Provider  amLODipine-valsartan (EXFORGE) 10-320 MG per tablet Take 1 tablet by mouth daily. 08/02/14   Adrian Prows, MD  aspirin 325 MG EC tablet Take 325 mg by mouth daily.       [provider]  cephALEXin (KEFLEX) 500 MG capsule Take 1 capsule (500 mg total) by mouth 3 (three) times daily. 09/14/17   Trula Slade, DPM  colchicine 0.6 MG tablet Take 0.6 mg by mouth daily.      [provider]  doxycycline (VIBRA-TABS) 100 MG tablet Take 1 tablet (100 mg total) by mouth 2 (two) times daily. 07/02/17   Trula Slade, DPM  febuxostat (ULORIC) 40 MG tablet Take 80 mg by mouth every morning.      [provider]  latanoprost (XALATAN) 0.005 % ophthalmic solution 1 drop at bedtime.      [provider]  metoprolol (TOPROL-XL) 200 MG 24 hr tablet Take 200 mg by mouth daily.      [provider]  mupirocin ointment (BACTROBAN) 2 % Apply 1 application topically 2 (two) times daily. 07/02/17   Trula Slade, DPM  pindolol (VISKEN) 5 MG tablet Take 5 mg by mouth 2 (two) times daily.    [provider]  Rivaroxaban (XARELTO) 15 MG TABS tablet Take 15 mg by mouth daily with supper.    [provider]  rosuvastatin (CRESTOR) 20 MG tablet Take 20 mg by mouth every evening.      [provider]  torsemide (DEMADEX) 20 MG tablet Take 40 mg by mouth daily.      [provider]    Family History Family History  Problem Relation Age of Onset  . Breast cancer Neg Hx     Social History Social History   Tobacco Use  . Smoking status: Never Smoker  . Smokeless tobacco: Former Systems developer    Types: Snuff  . Tobacco comment: "quit using snuff in the 1960's"  Substance Use Topics  . Alcohol use: No  . Drug use: No     Allergies   Zestoretic [lisinopril-hydrochlorothiazide]   Review of Systems Review of Systems  Unable to perform ROS: Severe respiratory distress     Physical Exam Updated Vital Signs BP (!) 168/64 (BP Location: Right Arm)   Pulse 80   Resp (!) 32   Ht 1.702 m (5\' 7" )   Wt 101.2 kg (223 lb)   SpO2 98%   BMI 34.93 kg/m   Physical Exam  Constitutional: She is  oriented to person, place, and time. She appears well-developed and well-nourished.  Non-toxic appearance. No distress.  HENT:  Head: Normocephalic and atraumatic.  Eyes: Pupils are equal, round, and reactive to light. Conjunctivae, EOM and lids are normal.  Neck: Normal range of motion. Neck supple. No tracheal deviation present. No thyroid mass present.  Cardiovascular: Normal rate, regular rhythm and normal heart sounds. Exam reveals no gallop.  No murmur heard. Pulmonary/Chest: Effort normal. No stridor. No respiratory distress. She has decreased breath sounds in the right lower field and the left lower field. She has no wheezes. She has rhonchi in the right lower field and the left lower field. She has no rales.  Abdominal: Soft. Normal appearance and bowel sounds are normal. She exhibits no distension. There is no tenderness. There is no rebound and no CVA tenderness.  Musculoskeletal: Normal range of motion. She exhibits no edema or tenderness.  Lymphadenopathy:  Bilateral le pedal edema  Neurological: She is alert and oriented to person, place, and time. No cranial nerve deficit. GCS eye subscore is 4. GCS verbal subscore is 5. GCS motor subscore is 6.  Skin: Skin is warm. No abrasion and no rash noted. She is diaphoretic.  Psychiatric: She has a normal mood and affect. Her speech is normal and behavior is normal.  Nursing note and vitals reviewed.    ED Treatments / Results  Labs (all labs ordered are listed, but only abnormal results are displayed) Labs Reviewed  CBC  BASIC METABOLIC PANEL  BRAIN NATRIURETIC PEPTIDE  I-STAT CG4 LACTIC ACID, ED  I-STAT TROPONIN, ED    EKG EKG Interpretation  Date/Time:  Friday September 24 2017 06:56:50 EDT Ventricular Rate:  85 PR Interval:  258 QRS Duration: 90 QT Interval:  366 QTC Calculation: 435 R Axis:   116 Text Interpretation:  Sinus rhythm with sinus arrhythmia with 1st degree A-V block Anterolateral infarct , age undetermined  Abnormal ECG Confirmed by Lacretia Leigh ([id number]) on 09/24/2017 7:16:31 AM   Radiology No results found.  Procedures Procedures (including critical care time)  Medications Ordered in ED Medications  furosemide (LASIX) injection 60 mg (has no administration in time range)     Initial Impression / Assessment and Plan / ED Course  I have reviewed the triage vital signs and the nursing notes.  Pertinent labs & imaging results that were available during my care of the patient were reviewed by me and considered in my medical decision making (see chart for details).     Patient presented extremis on arrival.  Was placed on BiPAP and her oxygenation improved.  Was found to be in CHF and was given  Lasix 60 mg IV push as well as placed on nitroglycerin drip.  Her EKG showed no signs of acute ischemia.  No signs of ACS with a normal troponin.  Does have elevated creatinine which is worrisome for worsening renal insufficiency.  Discussed with hospitalist who will admit patient to stepdown   CRITICAL CARE Performed by: Leota Jacobsen Total critical care time: 60 minutes Critical care time was exclusive of separately billable procedures and treating other patients. Critical care was necessary to treat or prevent imminent or life-threatening deterioration. Critical care was time spent personally by me on the following activities: development of treatment plan with patient and/or surrogate as well as nursing, discussions with consultants, evaluation of patient's response to treatment, examination of patient, obtaining history from patient or surrogate, ordering and performing treatments and interventions, ordering and review of laboratory studies, ordering and review of radiographic studies, pulse oximetry and re-evaluation of patient's condition.   Final Clinical Impressions(s) / ED Diagnoses   Final diagnoses:  None    ED Discharge Orders    None       Lacretia Leigh, MD 09/24/17  [phone]

## 2017-09-24 NOTE — H&P (Addendum)
History and Physical    [name]:[id number] DOB: 14-Dec-1930 DOA: 09/24/2017   PCP: [name], MD   Patient coming from:  Home    Chief Complaint: Shortness of breath  HPI: Shannon [name] is a 84 y.o. female with medical history significant for with a history of atrial fibrillation, now on sinus rhythm, status post cardioversion in May 2018, on chronic anticoagulation, gout, hypertension, hyperlipidemia, history of mitral regurgitation, peripheral vascular disease, presenting to the emergency department with acute shortness of breath since this morning, upon waking up.  The patient denies any chest pain or palpitations.  She denies any prior history of CHF.  The patient reports increasing shortness of breath, with nonproductive cough, but without fever or chills.  The patient had a recent episode of lower extremity swelling in both legs, exacerbated by walking.  He denies any food indiscretions.  She denies any changes in her medications.  She is compliant with these meds.  Denies any recent long distance trip.  She denies  rhinorrhea or hemoptysis. Denies fevers, chills, night sweats or mucositis. Denies any abdominal pain. Has decreased appetite due to current symptoms, denies nausea without vomiting. Denies dizziness or vertigo.  Denies calf pain no confusion was reported. Denies any vision changes, double vision or headaches.    ED Course:  BP (!) 160/68 (BP Location: Right Arm)   Pulse 72   Resp (!) 36   Ht 5\' 7"  (1.702 m)   Wt 101.2 kg (223 lb)   SpO2 100%   BMI 34.93 kg/m     EKG shows  Sinus rhythm with sinus arrhythmia with 1st degree A-V block, without any changes from prior. BMET remarkable for Cr 1.43 (GFR 30s) 0.2 Troponin   0.01 Lactic acid 2.7 White count 7.7, hemoglobin 13.5, platelets 238, glucose 207 Chest x-ray markable for congestive heart failure with bilateral pulmonary edema and bilateral pleural effusions. She received Lasix 60 mg IV, with improvement of her  respiratory symptoms.  He is diuresing well. She is currently on BiPAP, as when arriving, her O2 sats were in the 60s, and her tachypnea was showing respiratory rate in the 40s.  Review of Systems:  As per HPI otherwise all other systems reviewed and are negative  Past Medical History:  Diagnosis Date  . Arthritis    "all over"  . Gout   . Hypercholesteremia   . Hypertension   . Mitral regurgitation     Past Surgical History:  Procedure Laterality Date  . BREAST BIOPSY Left 1984  . CARDIOVERSION N/A 10/27/2016   Procedure: CARDIOVERSION;  Surgeon: Adrian Prows, MD;  Location: [place];  Service: Cardiovascular;  Laterality: N/A;  . CATARACT EXTRACTION, BILATERAL Bilateral 2000's  . RENAL ANGIOGRAM Bilateral 07/31/2014   Procedure: RENAL ANGIOGRAM;  Surgeon: Laverda Page, MD;  Location: [facility] CATH LAB;  Service: Cardiovascular;  Laterality: Bilateral;    Social History Social History   Socioeconomic History  . Marital status: Married    Spouse name: Not on file  . Number of children: Not on file  . Years of education: Not on file  . Highest education level: Not on file  Occupational History  . Not on file  Social Needs  . Financial resource strain: Not on file  . Food insecurity:    Worry: Not on file    Inability: Not on file  . Transportation needs:    Medical: Not on file    Non-medical: Not on file  Tobacco Use  .  Smoking status: Never Smoker  . Smokeless tobacco: Former Systems developer    Types: Snuff  . Tobacco comment: "quit using snuff in the 1960's"  Substance and Sexual Activity  . Alcohol use: No  . Drug use: No  . Sexual activity: Never  Lifestyle  . Physical activity:    Days per week: Not on file    Minutes per session: Not on file  . Stress: Not on file  Relationships  . Social connections:    Talks on phone: Not on file    Gets together: Not on file    Attends religious service: Not on file    Active member of club or organization: Not on file     Attends meetings of clubs or organizations: Not on file    Relationship status: Not on file  . Intimate partner violence:    Fear of current or ex partner: Not on file    Emotionally abused: Not on file    Physically abused: Not on file    Forced sexual activity: Not on file  Other Topics Concern  . Not on file  Social History Narrative  . Not on file     Allergies  Allergen Reactions  . Zestoretic [Lisinopril-Hydrochlorothiazide] Itching and Rash    Family History  Problem Relation Age of Onset  . Breast cancer Neg Hx       Prior to Admission medications   Medication Sig Start Date End Date Taking? Authorizing Provider  amLODipine-valsartan (EXFORGE) 10-320 MG per tablet Take 1 tablet by mouth daily. 08/02/14   Adrian Prows, MD  aspirin 325 MG EC tablet Take 325 mg by mouth daily.      [provider]  cephALEXin (KEFLEX) 500 MG capsule Take 1 capsule (500 mg total) by mouth 3 (three) times daily. 09/14/17   Trula Slade, DPM  colchicine 0.6 MG tablet Take 0.6 mg by mouth daily.      [provider]  doxycycline (VIBRA-TABS) 100 MG tablet Take 1 tablet (100 mg total) by mouth 2 (two) times daily. 07/02/17   Trula Slade, DPM  febuxostat (ULORIC) 40 MG tablet Take 80 mg by mouth every morning.      [provider]  latanoprost (XALATAN) 0.005 % ophthalmic solution 1 drop at bedtime.      [provider]  metoprolol (TOPROL-XL) 200 MG 24 hr tablet Take 200 mg by mouth daily.      [provider]  mupirocin ointment (BACTROBAN) 2 % Apply 1 application topically 2 (two) times daily. 07/02/17   Trula Slade, DPM  pindolol (VISKEN) 5 MG tablet Take 5 mg by  mouth 2 (two) times daily.    [provider]  Rivaroxaban (XARELTO) 15 MG TABS tablet Take 15 mg by mouth daily with supper.    [provider]  rosuvastatin (CRESTOR) 20 MG tablet Take 20 mg by mouth every evening.      [provider]    torsemide (DEMADEX) 20 MG tablet Take 40 mg by mouth daily.      [provider]    Physical Exam:  Vitals:   09/24/17 0718 09/24/17 0737 09/24/17 0751 09/24/17 0811  BP:  (!) 136/57 (!) 160/68   Pulse:  79 75 72  Resp:  (!) 40 (!) 36   SpO2:  98% 99% 100%  Weight: 101.2 kg (223 lb)     Height: 5\' 7"  (1.702 m)      Constitutional: NAD, calm, uncomfortable due  to shortness of breath, in the presence of BiPAP. Eyes: PERRL, lids and conjunctivae normal.  Known glaucoma ENMT: Mucous membranes are moist, without exudate or lesions  Neck: normal, supple, no masses, no thyromegaly Respiratory: She has decreased breath sounds in the right lower and left lower lung, no wheezing.  She has some rhonchi in the bases, no rales.  Mild respiratory effort at this time. Cardiovascular: Regular rate and rhythm, 1 out of 6 murmur, rubs or gallops. bilateral 2+ lower extremity edema. 2+ pedal pulses. No carotid bruits.  Abdomen: Soft, non tender, No hepatosplenomegaly. Bowel sounds positive.  Musculoskeletal: no clubbing / cyanosis. Moves all extremities Skin: The patient has left second toe ulceration without erythema.  Nonodorous.  No other lesions noted.  Onychomycosis noted Neurologic: Sensation intact  Strength equal in all extremities Psychiatric:   Alert and oriented x 3. Normal mood.    Labs on Admission: I have personally reviewed following labs and imaging studies  CBC: Recent Labs  Lab 09/24/17 0705  WBC 7.7  HGB 13.5  HCT 42.6  MCV 96.6  PLT 818    Basic Metabolic Panel: Recent Labs  Lab 09/24/17 0705  NA 141  K 3.8  CL 108  CO2 22  GLUCOSE 207*  BUN 25*  CREATININE 1.43*  CALCIUM 8.9    GFR: Estimated Creatinine Clearance: 35.1 mL/min (A) (by C-G formula based on SCr of 1.43 mg/dL (H)).  Liver Function Tests: No results for input(s): AST, ALT, ALKPHOS, BILITOT, PROT, ALBUMIN in the last 168 hours. No results for input(s): LIPASE, AMYLASE in the last 168  hours. No results for input(s): AMMONIA in the last 168 hours.  Coagulation Profile: No results for input(s): INR, PROTIME in the last 168 hours.  Cardiac Enzymes: No results for input(s): CKTOTAL, CKMB, CKMBINDEX, TROPONINI in the last 168 hours.  BNP (last 3 results) No results for input(s): PROBNP in the last 8760 hours.  HbA1C: No results for input(s): HGBA1C in the last 72 hours.  CBG: No results for input(s): GLUCAP in the last 168 hours.  Lipid Profile: No results for input(s): CHOL, HDL, LDLCALC, TRIG, CHOLHDL, LDLDIRECT in the last 72 hours.  Thyroid Function Tests: No results for input(s): TSH, T4TOTAL, FREET4, T3FREE, THYROIDAB in the last 72 hours.  Anemia Panel: No results for input(s): VITAMINB12, FOLATE, FERRITIN, TIBC, IRON, RETICCTPCT in the last 72 hours.  Urine analysis: No results found for: COLORURINE, APPEARANCEUR, LABSPEC, PHURINE, GLUCOSEU, HGBUR, BILIRUBINUR, KETONESUR, PROTEINUR, UROBILINOGEN, NITRITE, LEUKOCYTESUR  Sepsis Labs: @LABRCNTIP (procalcitonin:4,lacticidven:4) )No results found for this or any previous visit (from the past 240 hour(s)).   Radiological Exams on Admission: Dg Chest Portable 1 View  Result Date: 09/24/2017 CLINICAL DATA:  Shortness of breath.  Chest pain EXAM: PORTABLE CHEST 1 VIEW COMPARISON:  02/03/2010. FINDINGS: Cardiomegaly with pulmonary vascular prominence and diffuse bilateral pulmonary infiltrates with bilateral pleural effusions. Findings consistent with CHF. Degenerative changes both shoulders and thoracic spine. IMPRESSION: Congestive heart failure with bilateral pulmonary edema and bilateral pleural effusions. Electronically Signed   By: Marcello Moores  Register   On: 09/24/2017 07:39    EKG: Independently reviewed.  Assessment/Plan Principal Problem:   CHF exacerbation (HCC) Active Problems:   Renovascular hypertension   Renal arterial hypertension   Atrial fibrillation (HCC)   Hyperlipidemia   Hypertension    Acute respiratory failure with hypoxia (HCC)   Gout  . Acute hypoxic respiratory failure likely secondary toAcute  CHF.  EKG shows  Sinus rhythm with sinus arrhythmia with 1st degree  A-V block, without any changes from prior.Chest x-ray markable for congestive heart failure with bilateral pulmonary edema and bilateral pleural effusions.She received Lasix 60 mg IV, with improvement of her respiratory symptoms.  She is diuresing well.She is currently on BiPAP, as when arriving, her O2 sats were in the 60s, and her tachypnea was showing respiratory rate in the 40s BNP pending  SDU IP  CHF order set  2 D echo  IV Lasix 60 mg bid  NPO for now Strict I/Os   Continue Bipap Oxigen Hold CaCH Blockers and BB TSH and T4   Elevated lactate 2.7 . Etiology unclear urinalysis pending  and chest x-ray without indication of infectious process. Afebrile  repeat lactic acid,will consider antibiotics if continues to rise  No IVF given due to CHF exacerbation      Hypertension BP  155/64   Pulse 69   Resp   30    Hold  home anti-hypertensive medications  Expected to normalize upon initiation of diuresis with Lasix.  Demadex on hold.   Hyperlipidemia Continue home Crestor  Gout, no acute flare Continue Allopurinol, hold colchicine   Necrotic toe, appears that has been  Followed at the foot center under Dr Jacqualyn Posey care Dr. Jacqualyn Posey has been informed of the patient's admission     Elevated blood sugar, no h/o DM. Glu 207 Check A1C CBG bid     DVT prophylaxis:  Xarelto Code Status:    Full  Family Communication:  Discussed with patient Disposition Plan: Expect patient to be discharged to home after condition improves Consults called:    None  Admission status: IP SDU    Sharene Butters, PA-C [facility]   Amion text  [id number]   09/24/2017, 8:16 AM

## 2017-09-24 NOTE — ED Notes (Signed)
In to assess; pt requested fluids - Ginger Ale given - pt tolerating well; awake, alert, oriented x4; skin warm, dry; slightly tachypneic at 28 breaths per minute; reports feels more sob then previously; NTG drip rate increased as noted on [facility]

## 2017-09-24 NOTE — ED Notes (Signed)
Pt tolerating BiPap well; resp remain labored at 32 breaths per min; however, pt appears much more comfortable than at time of prior assessment - family remains at bedside; no complaints at this time

## 2017-09-24 NOTE — ED Notes (Signed)
Pt requesting something to eat.  Admitting MD paged

## 2017-09-24 NOTE — Progress Notes (Signed)
Pt taken off bipap and placed on 4L [place] at this time. Clear/diminished BS throughout. VS within normal limits. Pt denies SOB, no increased WOB. RN at bedside. RT will continue to monitor for possible need of bipap again

## 2017-09-24 NOTE — ED Notes (Signed)
Arrived to room via wheelchair with husband at bedside; assisted onto stretcher - resp labored at 40 breaths per min; placed on ccm showing SR rate 72 without ectopy; denies any c/o SOB nor pain at this time; reports sudden onset of SOB approx 45 min pta - husband reports BLE "swelling" for "a few days"; pt awake, alert unable to speak in complete sentences; ED MD in to assess  - orders given - RT in to place pt on BiPap

## 2017-09-25 ENCOUNTER — Inpatient Hospital Stay (HOSPITAL_COMMUNITY): Payer: Medicare Other

## 2017-09-25 DIAGNOSIS — E876 Hypokalemia: Secondary | ICD-10-CM

## 2017-09-25 LAB — BRAIN NATRIURETIC PEPTIDE: B Natriuretic Peptide: 397.4 pg/mL — ABNORMAL HIGH (ref 0.0–100.0)

## 2017-09-25 LAB — COMPREHENSIVE METABOLIC PANEL
ALK PHOS: 67 U/L (ref 38–126)
ALT: 12 U/L — AB (ref 14–54)
AST: 18 U/L (ref 15–41)
Albumin: 3.1 g/dL — ABNORMAL LOW (ref 3.5–5.0)
Anion gap: 9 (ref 5–15)
BUN: 20 mg/dL (ref 6–20)
CALCIUM: 8.4 mg/dL — AB (ref 8.9–10.3)
CHLORIDE: 107 mmol/L (ref 101–111)
CO2: 26 mmol/L (ref 22–32)
CREATININE: 1.15 mg/dL — AB (ref 0.44–1.00)
GFR calc Af Amer: 49 mL/min — ABNORMAL LOW (ref >60)
GFR calc non Af Amer: 42 mL/min — ABNORMAL LOW (ref >60)
Glucose, Bld: 120 mg/dL — ABNORMAL HIGH (ref 65–99)
Potassium: 2.9 mmol/L — ABNORMAL LOW (ref 3.5–5.1)
Sodium: 142 mmol/L (ref 135–145)
Total Bilirubin: 1.2 mg/dL (ref 0.3–1.2)
Total Protein: 6.3 g/dL — ABNORMAL LOW (ref 6.5–8.1)

## 2017-09-25 LAB — POTASSIUM: POTASSIUM: 3.7 mmol/L (ref 3.5–5.1)

## 2017-09-25 LAB — CBC
HEMATOCRIT: 35 % — AB (ref 36.0–46.0)
HEMOGLOBIN: 11.2 g/dL — AB (ref 12.0–15.0)
MCH: 30.1 pg (ref 26.0–34.0)
MCHC: 32 g/dL (ref 30.0–36.0)
MCV: 94.1 fL (ref 78.0–100.0)
PLATELETS: 176 10*3/uL (ref 150–400)
RBC: 3.72 MIL/uL — AB (ref 3.87–5.11)
RDW: 14.4 % (ref 11.5–15.5)
WBC: 6.1 10*3/uL (ref 4.0–10.5)

## 2017-09-25 LAB — ECHOCARDIOGRAM COMPLETE
HEIGHTINCHES: 67 in
Weight: 3652.8 oz

## 2017-09-25 LAB — MRSA PCR SCREENING: MRSA by PCR: NEGATIVE

## 2017-09-25 MED ORDER — POTASSIUM CHLORIDE CRYS ER 20 MEQ PO TBCR: 20.0000 meq | EXTENDED_RELEASE_TABLET | ORAL | Status: AC

## 2017-09-25 MED ORDER — POTASSIUM CHLORIDE CRYS ER 20 MEQ PO TBCR
40.0000 meq | EXTENDED_RELEASE_TABLET | ORAL | Status: AC
  Administered 2017-09-25 (×2): 40 meq via ORAL
  Filled 2017-09-25 (×2): qty 2

## 2017-09-25 MED ORDER — POTASSIUM CHLORIDE CRYS ER 20 MEQ PO TBCR: 20.0000 meq | EXTENDED_RELEASE_TABLET | ORAL | Status: DC | PRN

## 2017-09-25 MED ORDER — TORSEMIDE 20 MG PO TABS
20.0000 mg | ORAL_TABLET | Freq: Two times a day (BID) | ORAL | Status: DC
  Administered 2017-09-26: 20 mg via ORAL
  Filled 2017-09-25: qty 1

## 2017-09-25 MED ORDER — POTASSIUM CHLORIDE CRYS ER 20 MEQ PO TBCR
40.0000 meq | EXTENDED_RELEASE_TABLET | Freq: Every day | ORAL | Status: DC
  Administered 2017-09-25: 40 meq via ORAL
  Filled 2017-09-25: qty 2

## 2017-09-25 NOTE — ED Notes (Signed)
Attempted report x1. 

## 2017-09-25 NOTE — Progress Notes (Signed)
PROGRESS NOTE    Shannon SUMNERS  KDX:[id number] DOB: 1930/12/31 DOA: 09/24/2017 PCP: [name], MD  Outpatient Specialists:     Brief Narrative: Patient is an 84 year old female, obese, with past medical history significant for atrial fibrillation on chronic anticoagulation, peripheral vascular disease, hypertension, hyperlipidemia, gout, chronic kidney disease, arthritis and mitral regurgitation.  Patient was admitted with CHF exacerbation.  Echo done earlier today revealed EF of 97-67%, grade 2 diastolic dysfunction and PA peak pressure of 49 mmHg, indicative of moderate pulmonary hypertension.  Patient was initially on BiPAP, but now off BiPAP.  Patient has improved significantly, but not yet back to baseline.  Assessment & Plan:   Principal Problem:   Congestive heart failure (CHF) (HCC) Active Problems:   Renovascular hypertension   Renal arterial hypertension   Atrial fibrillation (HCC)   Hyperlipidemia   Hypertension   Acute respiratory failure with hypoxia (HCC)   Gout   Acute exacerbation of CHF (congestive heart failure) (HCC)  Acute hypoxic respiratory failure/acute on chronic diastolic congestive heart failure: EKG showsSinus rhythm with sinus arrhythmia with 1st degree A-V block, without any changes from prior.Chest x-ray markable for congestive heart failure with bilateral pulmonary edema and bilateral pleural effusions.She received Lasix 60 mg IV, with improvement of her respiratory symptoms.  She is diuresing well.She is currently on BiPAP, as when arriving, her O2 sats were in the 60s, and her tachypnea was showing respiratory rate in the 40s BNP elevated at 397.4  CHF order set  2 D echo-please see above Continue IV Lasix 60 mg bid  Strict I/Os   Continue Bipap as needed Oxigen Monitor renal function and electrolytes.  Elevated lactate 2.7  Repeat lactic acid in a.m. Patient does not look septic.  Hypokalemia: -Replete potassium. -Check renal panel and  magnesium level in a.m.  Hypertension   Continue amlodipine. Continue Avapro, but would monitor renal function closely. Check renal function and electrolytes in a.m.  Hyperlipidemia Continue home Crestor  Gout, no acute flare Continue Allopurinol, hold colchicine   Necrotic toe, appears that has been  Followed at the foot center under Dr Jacqualyn Posey care Dr. Jacqualyn Posey has been informed of the patient's admission  Obesity/likely undiagnosed OSA and OHS: -Optimize diet. -Consider pursuing sleep study on outpatient basis.  Elevated blood sugar: -Continue to monitor -A1c was 5.5.    DVT prophylaxis:  Xarelto Code Status:    Full  Family Communication:  Disposition Plan: Home eventually   Consultants:   None  Procedures:   Echocardiogram  Antimicrobials:   None   Subjective: Shortness of breath has improved significantly, but patient is not back to baseline.  No fever or chills, no chest pain.  Objective: Vitals:   09/25/17 0600 09/25/17 0700 09/25/17 0715 09/25/17 0800  BP: (!) 125/56 132/60 133/61 (!) 141/55  Pulse: 69 65 66 70  Resp: (!) 25 20 (!) 21 (!) 25  SpO2: 97% 98% 97% 99%  Weight:      Height:        Intake/Output Summary (Last 24 hours) at 09/25/2017 0917 Last data filed at 09/25/2017 0730 Gross per 24 hour  Intake -  Output 1350 ml  Net -1350 ml   Filed Weights   09/24/17 0718  Weight: 101.2 kg (223 lb)    Examination:  General exam: Appears calm and comfortable  Respiratory system: Clear to auscultation. Respiratory effort normal. Cardiovascular system: S1 & S2 heard, RRR. No JVD, murmurs, rubs, gallops or clicks. No pedal edema. Gastrointestinal system: Abdomen  is nondistended, soft and nontender. No organomegaly or masses felt. Normal bowel sounds heard. Central nervous system: Alert and oriented. No focal neurological deficits. Extremities: Symmetric 5 x 5 power. Skin: No rashes, lesions or ulcers Psychiatry: Judgement and  insight appear normal. Mood & affect appropriate.     Data Reviewed: I have personally reviewed following labs and imaging studies  CBC: Recent Labs  Lab 09/24/17 0705 09/25/17 0335  WBC 7.7 6.1  HGB 13.5 11.2*  HCT 42.6 35.0*  MCV 96.6 94.1  PLT 238 242   Basic Metabolic Panel: Recent Labs  Lab 09/24/17 0705 09/25/17 0335  NA 141 142  K 3.8 2.9*  CL 108 107  CO2 22 26  GLUCOSE 207* 120*  BUN 25* 20  CREATININE 1.43* 1.15*  CALCIUM 8.9 8.4*   GFR: Estimated Creatinine Clearance: 43.7 mL/min (A) (by C-G formula based on SCr of 1.15 mg/dL (H)). Liver Function Tests: Recent Labs  Lab 09/25/17 0335  AST 18  ALT 12*  ALKPHOS 67  BILITOT 1.2  PROT 6.3*  ALBUMIN 3.1*   No results for input(s): LIPASE, AMYLASE in the last 168 hours. No results for input(s): AMMONIA in the last 168 hours. Coagulation Profile: No results for input(s): INR, PROTIME in the last 168 hours. Cardiac Enzymes: Recent Labs  Lab 09/24/17 1024 09/24/17 1520 09/24/17 2112  TROPONINI 0.05* 0.07* 0.07*   BNP (last 3 results) No results for input(s): PROBNP in the last 8760 hours. HbA1C: Recent Labs    09/24/17 1024  HGBA1C 5.5   CBG: No results for input(s): GLUCAP in the last 168 hours. Lipid Profile: No results for input(s): CHOL, HDL, LDLCALC, TRIG, CHOLHDL, LDLDIRECT in the last 72 hours. Thyroid Function Tests: Recent Labs    09/24/17 0830  TSH 4.464  FREET4 0.86   Anemia Panel: No results for input(s): VITAMINB12, FOLATE, FERRITIN, TIBC, IRON, RETICCTPCT in the last 72 hours. Urine analysis:    Component Value Date/Time   COLORURINE STRAW (A) 09/24/2017 1815   APPEARANCEUR CLEAR 09/24/2017 1815   LABSPEC 1.006 09/24/2017 1815   PHURINE 7.0 09/24/2017 1815   GLUCOSEU NEGATIVE 09/24/2017 1815   HGBUR SMALL (A) 09/24/2017 1815   BILIRUBINUR NEGATIVE 09/24/2017 1815   KETONESUR NEGATIVE 09/24/2017 1815   PROTEINUR NEGATIVE 09/24/2017 1815   NITRITE NEGATIVE  09/24/2017 1815   LEUKOCYTESUR NEGATIVE 09/24/2017 1815   Sepsis Labs: @LABRCNTIP (procalcitonin:4,lacticidven:4)  )No results found for this or any previous visit (from the past 240 hour(s)).       Radiology Studies: Dg Chest Portable 1 View  Result Date: 09/24/2017 CLINICAL DATA:  Shortness of breath.  Chest pain EXAM: PORTABLE CHEST 1 VIEW COMPARISON:  02/03/2010. FINDINGS: Cardiomegaly with pulmonary vascular prominence and diffuse bilateral pulmonary infiltrates with bilateral pleural effusions. Findings consistent with CHF. Degenerative changes both shoulders and thoracic spine. IMPRESSION: Congestive heart failure with bilateral pulmonary edema and bilateral pleural effusions. Electronically Signed   By: Marcello Moores  Register   On: 09/24/2017 07:39        Scheduled Meds: . amLODipine  10 mg Oral Daily  . febuxostat  80 mg Oral [id number]  . furosemide  60 mg Intravenous Q8H  . irbesartan  150 mg Oral Daily  . pindolol  5 mg Oral BID  . Rivaroxaban  15 mg Oral Q supper  . rosuvastatin  20 mg Oral QPM   Continuous Infusions:   LOS: 1 day    Time spent: Greater than 35 minutes    Dana Allan, MD  [facility] Pager #: [phone] 7PM-7AM contact night coverage as above

## 2017-09-25 NOTE — Progress Notes (Signed)
Subjective:  Feels much better. Leg edema has resolved.   Objective:  Vital Signs in the last 24 hours: Temp:  [98.9 F (37.2 C)] 98.9 F (37.2 C) (04/13 1144) Pulse Rate:  [56-87] 56 (04/13 1144) Resp:  [16-35] 20 (04/13 1144) BP: (104-153)/(45-72) 128/66 (04/13 1144) SpO2:  [95 %-99 %] 96 % (04/13 1144) Weight:  [103.6 kg (228 lb 4.8 oz)] 103.6 kg (228 lb 4.8 oz) (04/13 1144)  Intake/Output from previous day: 04/12 0701 - 04/13 0700 In: -  Out: 1000 [Urine:1000] Blood pressure 128/66, pulse (!) 56, temperature 98.9 F (37.2 C), temperature source Oral, resp. rate 20, height 5\' 7"  (1.702 m), weight 103.6 kg (228 lb 4.8 oz), SpO2 96 %.  Physical Exam: Physical Exam  Constitutional: She is oriented to person, place, and time. She appears well-developed and well-nourished.  Moderate obesity  Eyes: Conjunctivae are normal.  Neck: Normal range of motion. Neck supple.  Cardiovascular: Normal rate, regular rhythm, normal heart sounds and intact distal pulses.  No murmur heard. Pulmonary/Chest: Effort normal and breath sounds normal.  Abdominal: Soft. Bowel sounds are normal.  Obese, pannus present  Musculoskeletal: Normal range of motion. She exhibits no edema, tenderness or deformity.  Neurological: She is alert and oriented to person, place, and time.  Skin: Skin is warm and dry.  Psychiatric: She has a normal mood and affect.   Lab Results: BMP Recent Labs    09/24/17 0705 09/25/17 0335  NA 141 142  K 3.8 2.9*  CL 108 107  CO2 22 26  GLUCOSE 207* 120*  BUN 25* 20  CREATININE 1.43* 1.15*  CALCIUM 8.9 8.4*  GFRNONAA 32* 42*  GFRAA 38* 49*    CBC Recent Labs  Lab 09/25/17 0335  WBC 6.1  RBC 3.72*  HGB 11.2*  HCT 35.0*  PLT 176  MCV 94.1  MCH 30.1  MCHC 32.0  RDW 14.4    HEMOGLOBIN A1C Lab Results  Component Value Date   HGBA1C 5.5 09/24/2017   MPG 111.15 09/24/2017    Cardiac Panel (last 3 results) Recent Labs    09/24/17 1024 09/24/17 1520  09/24/17 2112  TROPONINI 0.05* 0.07* 0.07*  TSH Recent Labs    09/24/17 0830  TSH 4.464    Lipid Panel  No results found for: CHOL, TRIG, HDL, CHOLHDL, VLDL, LDLCALC, LDLDIRECT   Hepatic Function Panel Recent Labs    09/25/17 0335  PROT 6.3*  ALBUMIN 3.1*  AST 18  ALT 12*  ALKPHOS 67  BILITOT 1.2   BNP (last 3 results) Recent Labs    09/24/17 0705 09/25/17 0335  BNP 502.3* 397.4*    ProBNP (last 3 results) No results for input(s): PROBNP in the last 8760 hours.  Cardiac Studies:  EKG 09/24/2017: Sinus rhythm with first-degree AV block at rate of 85 bpm, left atrial enlargement, normal axis.  Poor R wave progression, cannot exclude anteroseptal infarct old.  LVH with repolarization abnormality.  Compared to the EKG done on 07/31/2014, T wave inversion in lateral lead is new.  Voltage has increased.  Lexiscan Myoview stress test 10/12/2016: Resting EKG demonstrates atrial fibrillation nonspecific T abnormality.  Stress EKG nondiagnostic.  Normal perfusion without evidence of ischemia or scar.  EF 41% and may be inaccurate due to difficulty with gating.  Echocardiogram 09/25/2017: Low normal LVEF at 54-62%, grade 2 diastolic dysfunction.  Severely dilated left atrium and right atrium, mild RV dilatation with mild RVH.  Moderate pulmonary hypertension, PA pressure 49 mmHg.  Echocardiogram 10/27/2016:  Normal LVEF at 55-60%.  No significant valvular abnormality.  Renal angiography 07/31/2014: Moderate disease in the right renal artery of 30-40%, does not appear to be high-grade.  Mild to moderate disease in the left renal artery.  Renal artery duplex 06/25/2014: Right renal artery appears to have significant stenosis.  Assessment/Plan:  1.  Acute on chronic diastolic heart failure secondary to dietary noncompliance. 2.  Resistant hypertension, blood pressure now very well controlled even with reduced dose of medications from home, suggesting diet playing a major  role. 3.  Paroxysmal atrial fibrillation, maintaining sinus rhythm, presently on Xarelto. CHA2DS2-VASCScore: Risk Score  5,  Yearly risk of stroke  6.7. 4.  Hypertensive chronic kidney disease stage III, severe hypokalemia due to diuresis.  Recommendation: Patient is stable for discharge in the morning.  Recheck potassium as she only received 40 mEq of potassium and may need more supplements.  Will change her IV furosemide to torsemide 20 mg twice daily at 10 AM and 4 PM starting tomorrow.  I will see her back in the office in 10 days.  I have had a lengthy discussion with the patient and her family members regarding making lifestyle changes and making dietary modification.  Adrian Prows, M.D. 09/25/2017, 5:30 PM [facility], [place] Pager: [phone] Office: [phone] If no answer: [phone]

## 2017-09-25 NOTE — Progress Notes (Signed)
  Echocardiogram 2D Echocardiogram has been performed.  Shannon [name] 09/25/2017, 3:20 PM

## 2017-09-25 NOTE — Progress Notes (Signed)
PT Cancellation Note  Patient Details Name: BANESSA MAO MRN: [id number] DOB: 1930-09-27   Cancelled Treatment:    Reason Eval/Treat Not Completed: Active bedrest order. Please update activity order, when appropriate, for PT to proceed with eval. Thank you   Lorriane Shire 09/25/2017, 2:49 PM

## 2017-09-26 ENCOUNTER — Inpatient Hospital Stay (HOSPITAL_COMMUNITY): Payer: Medicare Other

## 2017-09-26 DIAGNOSIS — I5033 Acute on chronic diastolic (congestive) heart failure: Secondary | ICD-10-CM

## 2017-09-26 DIAGNOSIS — J9601 Acute respiratory failure with hypoxia: Secondary | ICD-10-CM

## 2017-09-26 DIAGNOSIS — I5021 Acute systolic (congestive) heart failure: Secondary | ICD-10-CM

## 2017-09-26 DIAGNOSIS — R0902 Hypoxemia: Secondary | ICD-10-CM

## 2017-09-26 DIAGNOSIS — I480 Paroxysmal atrial fibrillation: Secondary | ICD-10-CM

## 2017-09-26 LAB — RENAL FUNCTION PANEL
Albumin: 3.2 g/dL — ABNORMAL LOW (ref 3.5–5.0)
Anion gap: 9 (ref 5–15)
BUN: 19 mg/dL (ref 6–20)
CO2: 26 mmol/L (ref 22–32)
Calcium: 8.8 mg/dL — ABNORMAL LOW (ref 8.9–10.3)
Chloride: 106 mmol/L (ref 101–111)
Creatinine, Ser: 1.13 mg/dL — ABNORMAL HIGH (ref 0.44–1.00)
GFR calc Af Amer: 50 mL/min — ABNORMAL LOW (ref >60)
GFR calc non Af Amer: 43 mL/min — ABNORMAL LOW (ref >60)
Glucose, Bld: 125 mg/dL — ABNORMAL HIGH (ref 65–99)
Phosphorus: 2.7 mg/dL (ref 2.5–4.6)
Potassium: 3.8 mmol/L (ref 3.5–5.1)
Sodium: 141 mmol/L (ref 135–145)

## 2017-09-26 LAB — BRAIN NATRIURETIC PEPTIDE: B NATRIURETIC PEPTIDE 5: 333.9 pg/mL — AB (ref 0.0–100.0)

## 2017-09-26 LAB — MAGNESIUM: Magnesium: 2.1 mg/dL (ref 1.7–2.4)

## 2017-09-26 LAB — LACTIC ACID, PLASMA: Lactic Acid, Venous: 1.5 mmol/L (ref 0.5–1.9)

## 2017-09-26 MED ORDER — TORSEMIDE 20 MG PO TABS: 20.0000 mg | ORAL_TABLET | Freq: Two times a day (BID) | ORAL | 0 refills | Status: DC

## 2017-09-26 MED ORDER — AMLODIPINE BESYLATE 10 MG PO TABS: 10.0000 mg | ORAL_TABLET | Freq: Every day | ORAL | 0 refills | Status: DC

## 2017-09-26 MED ORDER — PINDOLOL 5 MG PO TABS: 5.0000 mg | ORAL_TABLET | Freq: Two times a day (BID) | ORAL | 0 refills | Status: DC

## 2017-09-26 MED ORDER — IRBESARTAN 150 MG PO TABS: 150.0000 mg | ORAL_TABLET | Freq: Every day | ORAL | 0 refills | Status: DC

## 2017-09-26 MED ORDER — POTASSIUM CHLORIDE CRYS ER 20 MEQ PO TBCR: 20.0000 meq | EXTENDED_RELEASE_TABLET | Freq: Every day | ORAL | 0 refills | Status: DC

## 2017-09-26 NOTE — Progress Notes (Signed)
1330: Pt ambulated approximately 300 feet. She did not get short of breath. On room air, her sats remained above 92%. No need for oxygen. MD notified through text messaging

## 2017-09-26 NOTE — Progress Notes (Signed)
RW to be delivered to room prior to DC

## 2017-09-26 NOTE — Discharge Summary (Addendum)
Discharge Summary  Shannon [name] IRS:[id number] DOB: 04/12/1931  PCP: [name], MD  Admit date: 09/24/2017 Discharge date: 09/26/2017  Time spent: 25 minutes  Recommendations for Outpatient Follow-up:  1. Follow up with cardiology 2. Follow up with PCP 3. Recommend polysomnography with pulmonology referred by PCP 4. Take your medications as prescribed  Discharge Diagnoses:  Active Hospital Problems   Diagnosis Date Noted  . Congestive heart failure (CHF) ([place]) 09/24/2017  . Hyperlipidemia 09/24/2017  . Hypertension 09/24/2017  . Acute respiratory failure with hypoxia ([place]) 09/24/2017  . Gout 09/24/2017  . Acute exacerbation of CHF (congestive heart failure) ([place]) 09/24/2017  . Atrial fibrillation ([place]) 10/25/2016  . Renal arterial hypertension 07/31/2014  . Renovascular hypertension 07/30/2014    Resolved Hospital Problems  No resolved problems to display.    Discharge Condition: stable   Diet recommendation: low salt diet  Vitals:   09/26/17 0817 09/26/17 1230  BP: (!) 157/73 (!) 133/54  Pulse: 65 (!) 54  Resp:    Temp: 98.2 F (36.8 C) 98.2 F (36.8 C)  SpO2: 96% 98%    History of present illness:  Shannon [name] is a 84 y.o. female with medical history significant for with a history of atrial fibrillation, now on sinus rhythm, status post cardioversion in May 2018, on chronic anticoagulation, gout, hypertension, hyperlipidemia, history of mitral regurgitation, peripheral vascular disease, presenting to the emergency department with acute shortness of breath since this morning, upon waking up.  The patient denies any chest pain or palpitations.  She denies any prior history of CHF.  The patient reports increasing shortness of breath, with nonproductive cough, but without fever or chills.  The patient had a recent episode of lower extremity swelling in both legs, exacerbated by walking. Admitted for acute hypoxic respiratory failure 2/2 to acute diastolic heart failure  exacerbation.   Diuresed well. Seen by cardiology who has now signed off and recommends outpatient follow up in 10 days.  On the day of discharge the patient was hemodynamically stable. She will need to follow up with her cardiologist and PCP post hospitalization.   Hospital Course:  Principal Problem:   Congestive heart failure (CHF) (HCC) Active Problems:   Renovascular hypertension   Renal arterial hypertension   Atrial fibrillation (HCC)   Hyperlipidemia   Hypertension   Acute respiratory failure with hypoxia (HCC)   Gout   Acute exacerbation of CHF (congestive heart failure) (HCC)  Acute hypoxic respiratory failure/acute on chronic diastolic congestive heart failure: EKG showsSinus rhythm with sinus arrhythmia with 1st degree A-V block, without any changes from prior.Chest x-ray markable for congestive heart failure with bilateral pulmonary edema and bilateral pleural effusions.She received Lasix 60 mg IV, with improvement of her respiratory symptoms.Sheis diuresing well.She is currently on BiPAP, as when arriving, her O2 sats were in the 60s, and her tachypnea was showing respiratory rate in the 40s BNP elevated at 397.4  2D echo revealed LVEF 50-55% w grade 2 diastolic dysfunction and moderate pulmonary HTN  Elevated lactate2.7 Repeat lactic acid in a.m. Patient does not look septic.  Hypokalemia: -Replete potassium. -Check renal panel and magnesium level in a.m.  Hypertension Continue amlodipine. Continue Avapro, but would monitor renal function closely. Check renal function and electrolytes in a.m.  Hyperlipidemia Continue homeCrestor  Gout, no acute flare Continue Allopurinol, hold colchicine   Necrotic toe, appears that has been Followed at the foot center under Dr Jacqualyn Posey care Dr. Jacqualyn Posey has been informed of the patient's admission  Obesity/likely  undiagnosed OSA and OHS: -Optimize diet. -Consider pursuing sleep study on outpatient  basis.  Elevated blood sugar: -Continue to monitor -A1c was 5.5.    Paroxysmal A-fib Rate controlled on BB On xarelto for CVA prophylaxis  Moderate pulmonary HTN Suspect OSA Please arrange for polysomnography with pulmonology referred by PCP   Procedures:  None   Consultations:  cardiology  Discharge Exam: BP (!) 133/54 (BP Location: Right Arm)   Pulse (!) 54   Temp 98.2 F (36.8 C) (Oral)   Resp (!) 24   Ht 5\' 7"  (1.702 m)   Wt 101.8 kg (224 lb 6.4 oz)   SpO2 98%   BMI 35.15 kg/m   General: 84 yo AAF WD WN NAD A&O x 3 Cardiovascular: RRR no rubs or gallops Respiratory: CT no wheezes or rales  Discharge Instructions You were cared for by a hospitalist during your hospital stay. If you have any questions about your discharge medications or the care you received while you were in the hospital after you are discharged, you can call the unit and asked to speak with the hospitalist on call if the hospitalist that took care of you is not available. Once you are discharged, your primary care physician will handle any further medical issues. Please note that NO REFILLS for any discharge medications will be authorized once you are discharged, as it is imperative that you return to your primary care physician (or establish a relationship with a primary care physician if you do not have one) for your aftercare needs so that they can reassess your need for medications and monitor your lab values.   Allergies as of 09/26/2017      Reactions   Zestoretic [lisinopril-hydrochlorothiazide] Itching, Rash      Medication List    STOP taking these medications   amLODipine-valsartan 10-320 MG tablet Commonly known as:  EXFORGE     TAKE these medications   amLODipine 10 MG tablet Commonly known as:  NORVASC Take 1 tablet (10 mg total) by mouth daily. Start taking on:  09/27/2017 What changed:    medication strength  how much to take   colchicine 0.6 MG tablet Take 0.6 mg  by mouth daily.   febuxostat 40 MG tablet Commonly known as:  ULORIC Take 40 mg by mouth every morning.   hydrALAZINE 50 MG tablet Commonly known as:  APRESOLINE Take 50 mg by mouth 3 (three) times daily.   irbesartan 150 MG tablet Commonly known as:  AVAPRO Take 1 tablet (150 mg total) by mouth daily. Start taking on:  09/27/2017   LUMIGAN 0.01 % Soln Generic drug:  bimatoprost Place 1 drop into both eyes at bedtime.   pindolol 5 MG tablet Commonly known as:  VISKEN Take 1 tablet (5 mg total) by mouth 2 (two) times daily. What changed:  when to take this   potassium chloride SA 20 MEQ tablet Commonly known as:  K-DUR,KLOR-CON Take 1 tablet (20 mEq total) by mouth daily.   Rivaroxaban 15 MG Tabs tablet Commonly known as:  XARELTO Take 15 mg by mouth daily with supper.   rosuvastatin 20 MG tablet Commonly known as:  CRESTOR Take 20 mg by mouth every evening.   torsemide 20 MG tablet Commonly known as:  DEMADEX Take 1 tablet (20 mg total) by mouth 2 (two) times daily at 10 am and 4 pm. What changed:  when to take this   TURMERIC PO Take 1 tablet by mouth daily.  Allergies  Allergen Reactions  . Zestoretic [Lisinopril-Hydrochlorothiazide] Itching and Rash   Follow-up Information    [name], MD Follow up in 3 day(s).   Specialty:  Internal Medicine Why:  please call office Contact information: [place] [address] [phone]        Adrian Prows, MD Follow up in 10 day(s).   Specialty:  Cardiology Why:  please call office Contact information: [place] [address] [phone]            The results of significant diagnostics from this hospitalization (including imaging, microbiology, ancillary and laboratory) are listed below for reference.    Significant Diagnostic Studies: Dg Chest Port 1 View  Result Date: 09/26/2017 CLINICAL DATA:  Hypoxia, shortness of breath EXAM: PORTABLE  CHEST 1 VIEW COMPARISON:  Portable exam 0841 hours compared to 09/24/2017 FINDINGS: Enlargement of cardiac silhouette with pulmonary vascular congestion. Decreased pulmonary edema. Bibasilar effusions. Atherosclerotic calcification aorta. No pneumothorax. Bones demineralized. IMPRESSION: Improved pulmonary edema with persistent bibasilar effusions. Electronically Signed   By: Lavonia Dana M.D.   On: 09/26/2017 09:57   Dg Chest Portable 1 View  Result Date: 09/24/2017 CLINICAL DATA:  Shortness of breath.  Chest pain EXAM: PORTABLE CHEST 1 VIEW COMPARISON:  02/03/2010. FINDINGS: Cardiomegaly with pulmonary vascular prominence and diffuse bilateral pulmonary infiltrates with bilateral pleural effusions. Findings consistent with CHF. Degenerative changes both shoulders and thoracic spine. IMPRESSION: Congestive heart failure with bilateral pulmonary edema and bilateral pleural effusions. Electronically Signed   By: Marcello Moores  Register   On: 09/24/2017 07:39   Dg Foot Complete Left  Result Date: 09/15/2017 Please see detailed radiograph report in office note.   Microbiology: Recent Results (from the past 240 hour(s))  MRSA PCR Screening     Status: None   Collection Time: 09/25/17  2:06 PM  Result Value Ref Range Status   MRSA by PCR NEGATIVE NEGATIVE Final    Comment:        The GeneXpert MRSA Assay (FDA approved for NASAL specimens only), is one component of a comprehensive MRSA colonization surveillance program. It is not intended to diagnose MRSA infection nor to guide or monitor treatment for MRSA infections. Performed at [facility], [place] [address] [place], [place] [address]      Labs: Basic Metabolic Panel: Recent Labs  Lab 09/24/17 0705 09/25/17 0335 09/25/17 1816 09/26/17 0319  NA 141 142  --  141  K 3.8 2.9* 3.7 3.8  CL 108 107  --  106  CO2 22 26  --  26  GLUCOSE 207* 120*  --  125*  BUN 25* 20  --  19  CREATININE 1.43* 1.15*  --  1.13*  CALCIUM 8.9 8.4*  --   8.8*  MG  --   --   --  2.1  PHOS  --   --   --  2.7   Liver Function Tests: Recent Labs  Lab 09/25/17 0335 09/26/17 0319  AST 18  --   ALT 12*  --   ALKPHOS 67  --   BILITOT 1.2  --   PROT 6.3*  --   ALBUMIN 3.1* 3.2*   No results for input(s): LIPASE, AMYLASE in the last 168 hours. No results for input(s): AMMONIA in the last 168 hours. CBC: Recent Labs  Lab 09/24/17 0705 09/25/17 0335  WBC 7.7 6.1  HGB 13.5 11.2*  HCT 42.6 35.0*  MCV 96.6 94.1  PLT  238 176   Cardiac Enzymes: Recent Labs  Lab 09/24/17 1024 09/24/17 1520 09/24/17 2112  TROPONINI 0.05* 0.07* 0.07*   BNP: BNP (last 3 results) Recent Labs    09/24/17 0705 09/25/17 0335 09/26/17 0319  BNP 502.3* 397.4* 333.9*    ProBNP (last 3 results) No results for input(s): PROBNP in the last 8760 hours.  CBG: No results for input(s): GLUCAP in the last 168 hours.     Signed:  Kayleen Memos, MD [facility] 09/26/2017, 12:39 PM

## 2017-09-26 NOTE — Evaluation (Addendum)
Occupational Therapy Evaluation Patient Details Name: Shannon [name] MRN: [id number] DOB: 1931/02/12 Today's Date: 09/26/2017    History of Present Illness Pt is an 84 y.o. female with PMH significant for atrial-fibrillation on chronic anticoagulation, peripheral vascular disease, hypertension, hyperlipidemia, gout, chronic kidney disease, arthritis, and mitral regurgitation. Pt admitted with acute CHF exacerbation and acute hypoxic respiratory failure.   Clinical Impression   Discussed case with RN prior to evaluation due to pt with active bedrest order. Per RN, pt to discharge today and requesting OT evaluate despite this order. PTA, pt was able to ambulate with cane and required assistance for LB dressing from her husband. She is currently able to complete toilet transfers with min guard assist using RW and requires min assist for tub transfers. Pt with potential O2 desaturation to 89% on RA throughout session although difficult to obtain accurate reading. Returned 2L supplemental O2 with improvement to 96%. Pt would benefit from continued OT services while admitted to improve independence and safety with ADL and functional mobility. Recommend 24 hour assistance from husband specifically with bathing tasks and use of RW for functional mobility during ADL transfers. Pt verbalizes understanding. Will continue to follow while admitted.    Follow Up Recommendations  No OT follow up;Supervision/Assistance - 24 hour    Equipment Recommendations  None recommended by OT(has needs met)    Recommendations for Other Services       Precautions / Restrictions Precautions Precautions: Fall Precaution Comments: uses RW Restrictions Weight Bearing Restrictions: No      Mobility Bed Mobility Overal bed mobility: Needs Assistance Bed Mobility: Supine to Sit     Supine to sit: Min assist     General bed mobility comments: Min assist to scoot hips to EOB.   Transfers Overall transfer level:  Needs assistance Equipment used: Rolling walker (2 wheeled) Transfers: Sit to/from Stand Sit to Stand: Supervision         General transfer comment: Supervision for safety.     Balance Overall balance assessment: Needs assistance Sitting-balance support: No upper extremity supported;Feet supported Sitting balance-Leahy Scale: Good     Standing balance support: Bilateral upper extremity supported;No upper extremity supported;During functional activity Standing balance-Leahy Scale: Fair Standing balance comment: Requires RW support for dynamic tasks.                            ADL either performed or assessed with clinical judgement   ADL Overall ADL's : Needs assistance/impaired Eating/Feeding: Set up;Sitting   Grooming: Standing;Moderate assistance   Upper Body Bathing: Set up;Sitting   Lower Body Bathing: Sit to/from stand;Moderate assistance   Upper Body Dressing : Set up;Sitting   Lower Body Dressing: Moderate assistance;Sit to/from stand   Toilet Transfer: Supervision/safety;Ambulation;RW   Toileting- Clothing Manipulation and Hygiene: Supervision/safety;Sit to/from stand   Tub/ Shower Transfer: Minimal assistance;Ambulation;Shower seat;Rolling walker   Functional mobility during ADLs: Supervision/safety;Rolling walker General ADL Comments: Pt educated concerning safe tub/shower transfers and need for husband to assist with these.      Vision Patient Visual Report: No change from baseline       Perception     Praxis      Pertinent Vitals/Pain Pain Assessment: No/denies pain     Hand Dominance     Extremity/Trunk Assessment Upper Extremity Assessment Upper Extremity Assessment: Generalized weakness   Lower Extremity Assessment Lower Extremity Assessment: Overall WFL for tasks assessed   Cervical / Trunk Assessment Cervical / Trunk Assessment:  Kyphotic(mildly)   Communication Communication Communication: No difficulties   Cognition  Arousal/Alertness: Awake/alert Behavior During Therapy: WFL for tasks assessed/performed Overall Cognitive Status: Within Functional Limits for tasks assessed                                     General Comments  RN requesting OT evaluate despite bedrest order as pt to discharge this afternoon.     Exercises     Shoulder Instructions      Home Living Family/patient expects to be discharged to:: Private residence Living Arrangements: Spouse/significant other;Children Available Help at Discharge: Family;Available 24 hours/day Type of Home: House Home Access: Level entry     Home Layout: One level     Bathroom Shower/Tub: Tub/shower unit         Home Equipment: Bedside commode;Shower seat(over toilet)          Prior Functioning/Environment Level of Independence: Needs assistance  Gait / Transfers Assistance Needed: Uses cane for mobility ADL's / Homemaking Assistance Needed: Husband assists with donning socks and shoes.             OT Problem List: Decreased strength;Decreased range of motion;Decreased activity tolerance;Impaired balance (sitting and/or standing);Decreased safety awareness;Decreased knowledge of use of DME or AE;Decreased knowledge of precautions      OT Treatment/Interventions: Self-care/ADL training;Therapeutic exercise;Energy conservation;DME and/or AE instruction;Therapeutic activities;Patient/family education;Balance training    OT Goals(Current goals can be found in the care plan section) Acute Rehab OT Goals Patient Stated Goal: to go home  OT Goal Formulation: With patient Time For Goal Achievement: 10/10/17 Potential to Achieve Goals: Good  OT Frequency: Min 2X/week   Barriers to D/C:            Co-evaluation              AM-PAC PT "6 Clicks" Daily Activity     Outcome Measure Help from another person eating meals?: None Help from another person taking care of personal grooming?: A Little Help from another  person toileting, which includes using toliet, bedpan, or urinal?: A Little Help from another person bathing (including washing, rinsing, drying)?: A Little Help from another person to put on and taking off regular upper body clothing?: A Little Help from another person to put on and taking off regular lower body clothing?: A Little 6 Click Score: 19   End of Session Equipment Utilized During Treatment: Rolling walker;Oxygen(2L O2) Nurse Communication: Mobility status  Activity Tolerance: Patient tolerated treatment well Patient left: in chair;with call bell/phone within reach;with chair alarm set  OT Visit Diagnosis: Other abnormalities of gait and mobility (R26.89);Muscle weakness (generalized) (M62.81)                Time: 1106-1130 OT Time Calculation (min): 24 min Charges:  OT General Charges $OT Visit: 1 Visit OT Evaluation $OT Eval Moderate Complexity: 1 Mod OT Treatments $Self Care/Home Management : 8-22 mins G-Codes:     Norman Herrlich, MS OTR/L  Pager: [place] A Akia Desroches 09/26/2017, 3:39 PM

## 2017-09-26 NOTE — Progress Notes (Signed)
Pt discharged with family in stable condition. No further questions once I reviewed d/c instructions. Pt taken to private vehicle via wheelchair with her daughter and granddaughter.

## 2017-09-26 NOTE — Progress Notes (Signed)
PT Cancellation Note  Patient Details Name: JAQUELYNE FIRKUS MRN: [id number] DOB: [birth year]   Cancelled Treatment:    Reason Eval/Treat Not Completed: Active bedrest order - bedrest written in same order set as PT eval, we are unable to mobilize patient until bedrest is removed.  Please address asap as pt has ?imminent discharge? Once ok for OOB we will be happy to evaluate mobility and make recommendations.  Thank you.   Kearney Hard [facility] 09/26/2017, 12:12 PM

## 2017-09-26 NOTE — Evaluation (Signed)
Physical Therapy Evaluation Patient Details Name: Shannon [name] MRN: [id number] DOB: [birth year] Today's Date: 09/26/2017   History of Present Illness  Pt is an 84 y.o. female with PMH significant for atrial-fibrillation on chronic anticoagulation, peripheral vascular disease, hypertension, hyperlipidemia, gout, chronic kidney disease, arthritis, and mitral regurgitation. Pt admitted with acute CHF exacerbation and acute hypoxic respiratory failure.    Clinical Impression  Asked to see pt prior to d/c at RN request despite active bedrest order as pt pending d/c today.  Pt received ambulating with CNA for O2 assessment (maintains SaO2 in low 90's unless talking.)  Pt demonstrates moderate limitations to functional mobility due to balance deficits, weakness aggravated by prolonged bedrest, resulting in new dependency on RW for stability during walking.  Family will provide all needs and received activity instruction from this PT.  Recommend RW as noted below for d/c home.      Follow Up Recommendations No PT follow up    Equipment Recommendations  Rolling walker with 5" wheels    Recommendations for Other Services       Precautions / Restrictions Precautions Precautions: Fall Precaution Comments: use RW      Mobility  Bed Mobility               General bed mobility comments: not observed  Transfers Overall transfer level: Modified independent Equipment used: None             General transfer comment: stand to sit  Ambulation/Gait Ambulation/Gait assistance: Supervision Ambulation Distance (Feet): 150 Feet Assistive device: Rolling walker (2 wheeled);None Gait Pattern/deviations: Step-through pattern;Decreased stride length;Narrow base of support;Trunk flexed   Gait velocity interpretation: <1.31 ft/sec, indicative of household ambulator General Gait Details: pt needs cues for posture (limited by kyphosis), and to look down hallway... without RW pt is more cautious  and fearful, but more confident and steady with.    Stairs            Wheelchair Mobility    Modified Rankin (Stroke Patients Only)       Balance Overall balance assessment: Mild deficits observed, not formally tested                                           Pertinent Vitals/Pain Pain Assessment: No/denies pain    Home Living Family/patient expects to be discharged to:: Private residence Living Arrangements: Spouse/significant other;Children Available Help at Discharge: Family;Available 24 hours/day Type of Home: House Home Access: Level entry     Home Layout: One level Home Equipment: None      Prior Function Level of Independence: Independent               Hand Dominance        Extremity/Trunk Assessment   Upper Extremity Assessment Upper Extremity Assessment: Defer to OT evaluation    Lower Extremity Assessment Lower Extremity Assessment: Overall WFL for tasks assessed;Generalized weakness    Cervical / Trunk Assessment Cervical / Trunk Assessment: Kyphotic(mildly)  Communication   Communication: No difficulties  Cognition Arousal/Alertness: Awake/alert Behavior During Therapy: WFL for tasks assessed/performed Overall Cognitive Status: Within Functional Limits for tasks assessed                                        General Comments General comments (skin  integrity, edema, etc.): limited balance ability, need device for safety    Exercises     Assessment/Plan    PT Assessment Patent does not need any further PT services  PT Problem List         PT Treatment Interventions      PT Goals (Current goals can be found in the Care Plan section)  Acute Rehab PT Goals Patient Stated Goal: hoem PT Goal Formulation: All assessment and education complete, DC therapy    Frequency     Barriers to discharge        Co-evaluation               AM-PAC PT "6 Clicks" Daily Activity  Outcome  Measure Difficulty turning over in bed (including adjusting bedclothes, sheets and blankets)?: A Little Difficulty moving from lying on back to sitting on the side of the bed? : A Little Difficulty sitting down on and standing up from a chair with arms (e.g., wheelchair, bedside commode, etc,.)?: A Little Help needed moving to and from a bed to chair (including a wheelchair)?: A Little Help needed walking in hospital room?: A Little Help needed climbing 3-5 steps with a railing? : A Lot 6 Click Score: 17    End of Session   Activity Tolerance: Patient tolerated treatment well Patient left: in chair;with call bell/phone within reach;with nursing/sitter in room;with family/visitor present Nurse Communication: Mobility status PT Visit Diagnosis: Unsteadiness on feet (R26.81)    Time: 4742-5956 PT Time Calculation (min) (ACUTE ONLY): 13 min   Charges:   PT Evaluation $PT Eval Low Complexity: 1 Low     PT G Codes:        Kearney Hard, PT, DPT, MS Board Certified Geriatric Clinical Specialist  Herbie Drape 09/26/2017, 1:46 PM

## 2017-09-26 NOTE — Discharge Instructions (Signed)
Acute Respiratory Failure, Adult Acute respiratory failure occurs when there is not enough oxygen passing from your lungs to your body. When this happens, your lungs have trouble removing carbon dioxide from the blood. This causes your blood oxygen level to drop too low as carbon dioxide builds up. Acute respiratory failure is a medical emergency. It can develop quickly, but it is temporary if treated promptly. Your lung capacity, or how much air your lungs can hold, may improve with time, exercise, and treatment. What are the causes? There are many possible causes of acute respiratory failure, including:  Lung injury.  Chest injury or damage to the ribs or tissues near the lungs.  Lung conditions that affect the flow of air and blood into and out of the lungs, such as pneumonia, acute respiratory distress syndrome, and cystic fibrosis.  Medical conditions, such as strokes or spinal cord injuries, that affect the muscles and nerves that control breathing.  Blood infection (sepsis).  Inflammation of the pancreas (pancreatitis).  A blood clot in the lungs (pulmonary embolism).  A large-volume blood transfusion.  Burns.  Near-drowning.  Seizure.  Smoke inhalation.  Reaction to medicines.  Alcohol or drug overdose.  What increases the risk? This condition is more likely to develop in people who have:  A blocked airway.  Asthma.  A condition or disease that damages or weakens the muscles, nerves, bones, or tissues that are involved in breathing.  A serious infection.  A health problem that blocks the unconscious reflex that is involved in breathing, such as hypothyroidism or sleep apnea.  A lung injury or trauma.  What are the signs or symptoms? Trouble breathing is the main symptom of acute respiratory failure. Symptoms may also include:  Rapid breathing.  Restlessness or anxiety.  Skin, lips, or fingernails that appear blue (cyanosis).  Rapid heart  rate.  Abnormal heart rhythms (arrhythmias).  Confusion or changes in behavior.  Tiredness or loss of energy.  Feeling sleepy or having a loss of consciousness.  How is this diagnosed? Your health care provider can diagnose acute respiratory failure with a medical history and physical exam. During the exam, your health care provider will listen to your heart and check for crackling or wheezing sounds in your lungs. Your may also have tests to confirm the diagnosis and determine what is causing respiratory failure. These tests may include:  Measuring the amount of oxygen in your blood (pulse oximetry). The measurement comes from a small device that is placed on your finger, earlobe, or toe.  Other blood tests to measure blood gases and to look for signs of infection.  Sampling your cerebral spinal fluid or tracheal fluid to check for infections.  Chest X-ray to look for fluid in spaces that should be filled with air.  Electrocardiogram (ECG) to look at the heart's electrical activity.  How is this treated? Treatment for this condition usually takes places in a hospital intensive care unit (ICU). Treatment depends on what is causing the condition. It may include one or more treatments until your symptoms improve. Treatment may include:  Supplemental oxygen. Extra oxygen is given through a tube in the nose, a face mask, or a hood.  A device such as a continuous positive airway pressure (CPAP) or bi-level positive airway pressure (BiPAP or BPAP) machine. This treatment uses mild air pressure to keep the airways open. A mask or other device will be placed over your nose or mouth. A tube that is connected to a motor will  deliver oxygen through the mask.  Ventilator. This treatment helps move air into and out of the lungs. This may be done with a bag and mask or a machine. For this treatment, a tube is placed in your windpipe (trachea) so air and oxygen can flow to the lungs.  Extracorporeal  membrane oxygenation (ECMO). This treatment temporarily takes over the function of the heart and lungs, supplying oxygen and removing carbon dioxide. ECMO gives the lungs a chance to recover. It may be used if a ventilator is not effective.  Tracheostomy. This is a procedure that creates a hole in the neck to insert a breathing tube.  Receiving fluids and medicines.  Rocking the bed to help breathing.  Follow these instructions at home:  Take over-the-counter and prescription medicines only as told by your health care provider.  Return to normal activities as told by your health care provider. Ask your health care provider what activities are safe for you.  Keep all follow-up visits as told by your health care provider. This is important. How is this prevented? Treating infections and medical conditions that may lead to acute respiratory failure can help prevent the condition from developing. Contact a health care provider if:  You have a fever.  Your symptoms do not improve or they get worse. Get help right away if:  You are having trouble breathing.  You lose consciousness.  Your have cyanosis or turn blue.  You develop a rapid heart rate.  You are confused. These symptoms may represent a serious problem that is an emergency. Do not wait to see if the symptoms will go away. Get medical help right away. Call your local emergency services (911 in the [place] Do not drive yourself to the hospital. This information is not intended to replace advice given to you by your health care provider. Make sure you discuss any questions you have with your health care provider. Document Released: 06/06/2013 Document Revised: 12/28/2015 Document Reviewed: 12/18/2015 Elsevier Interactive Patient Education  2018 [place] on my medicine - XARELTO (Rivaroxaban)  This medication education was reviewed with me or my healthcare representative as part of my discharge preparation.   Why was Xarelto prescribed for you? Xarelto was prescribed for you to reduce the risk of a blood clot forming that can cause a stroke if you have a medical condition called atrial fibrillation (a type of irregular heartbeat).  What do you need to know about xarelto ? Take your Xarelto ONCE DAILY at the same time every day with your evening meal. If you have difficulty swallowing the tablet whole, you may crush it and mix in applesauce just prior to taking your dose.  Take Xarelto exactly as prescribed by your doctor and DO NOT stop taking Xarelto without talking to the doctor who prescribed the medication.  Stopping without other stroke prevention medication to take the place of Xarelto may increase your risk of developing a clot that causes a stroke.  Refill your prescription before you run out.  After discharge, you should have regular check-up appointments with your healthcare provider that is prescribing your Xarelto.  In the future your dose may need to be changed if your kidney function or weight changes by a significant amount.  What do you do if you miss a dose? If you are taking Xarelto ONCE DAILY and you miss a dose, take it as soon as you remember on the same day then continue your regularly scheduled once daily regimen the next day.  Do not take two doses of Xarelto at the same time or on the same day.   Important Safety Information A possible side effect of Xarelto is bleeding. You should call your healthcare provider right away if you experience any of the following: ? Bleeding from an injury or your nose that does not stop. ? Unusual colored urine (red or dark brown) or unusual colored stools (red or black). ? Unusual bruising for unknown reasons. ? A serious fall or if you hit your head (even if there is no bleeding).  Some medicines may interact with Xarelto and might increase your risk of bleeding while on Xarelto. To help avoid this, consult your healthcare  provider or pharmacist prior to using any new prescription or non-prescription medications, including herbals, vitamins, non-steroidal anti-inflammatory drugs (NSAIDs) and supplements.  This website has more information on Xarelto: [email or web address]-benson.[email or web address]  Heart Failure Heart failure means your heart has trouble pumping blood. This makes it hard for your body to work well. Heart failure is usually a long-term (chronic) condition. You must take good care of yourself and follow your doctor's treatment plan. Follow these instructions at home:  Take your heart medicine as told by your doctor. ? Do not stop taking medicine unless your doctor tells you to. ? Do not skip any dose of medicine. ? Refill your medicines before they run out. ? Take other medicines only as told by your doctor or pharmacist.  Stay active if told by your doctor. The elderly and people with severe heart failure should talk with a doctor about physical activity.  Eat heart-healthy foods. Choose foods that are without trans fat and are low in saturated fat, cholesterol, and salt (sodium). This includes fresh or frozen fruits and vegetables, fish, lean meats, fat-free or low-fat dairy foods, whole grains, and high-fiber foods. Lentils and dried peas and beans (legumes) are also good choices.  Limit salt if told by your doctor.  Cook in a healthy way. Roast, grill, broil, bake, poach, steam, or stir-fry foods.  Limit fluids as told by your doctor.  Weigh yourself every morning. Do this after you pee (urinate) and before you eat breakfast. Write down your weight to give to your doctor.  Take your blood pressure and write it down if your doctor tells you to.  Ask your doctor how to check your pulse. Check your pulse as told.  Lose weight if told by your doctor.  Stop smoking or chewing tobacco. Do not use gum or patches that help you quit without your doctor's approval.  Schedule and go to doctor visits as told.  Nonpregnant  women should have no more than 1 drink a day. Men should have no more than 2 drinks a day. Talk to your doctor about drinking alcohol.  Stop illegal drug use.  Stay current with shots (immunizations).  Manage your health conditions as told by your doctor.  Learn to manage your stress.  Rest when you are tired.  If it is really hot outside: ? Avoid intense activities. ? Use air conditioning or fans, or get in a cooler place. ? Avoid caffeine and alcohol. ? Wear loose-fitting, lightweight, and light-colored clothing.  If it is really cold outside: ? Avoid intense activities. ? Layer your clothing. ? Wear mittens or gloves, a hat, and a scarf when going outside. ? Avoid alcohol.  Learn about heart failure and get support as needed.  Get help to maintain or improve your quality of life and your ability to care  for yourself as needed. Contact a doctor if:  You gain weight quickly.  You are more short of breath than usual.  You cannot do your normal activities.  You tire easily.  You cough more than normal, especially with activity.  You have any or more puffiness (swelling) in areas such as your hands, feet, ankles, or belly (abdomen).  You cannot sleep because it is hard to breathe.  You feel like your heart is beating fast (palpitations).  You get dizzy or light-headed when you stand up. Get help right away if:  You have trouble breathing.  There is a change in mental status, such as becoming less alert or not being able to focus.  You have chest pain or discomfort.  You faint. This information is not intended to replace advice given to you by your health care provider. Make sure you discuss any questions you have with your health care provider. Document Released: 03/10/2008 Document Revised: 11/07/2015 Document Reviewed: 07/18/2012 Elsevier Interactive Patient Education  2017 [employer].

## 2017-09-27 ENCOUNTER — Other Ambulatory Visit: Payer: Self-pay | Admitting: Podiatry

## 2017-09-27 ENCOUNTER — Ambulatory Visit (HOSPITAL_COMMUNITY)
Admission: RE | Admit: 2017-09-27 | Discharge: 2017-09-27 | Disposition: A | Discharge status: Home / Self Care | Payer: Medicare Other | Source: Ambulatory Visit | Attending: Cardiovascular Disease | Admitting: Cardiovascular Disease

## 2017-09-27 DIAGNOSIS — R0989 Other specified symptoms and signs involving the circulatory and respiratory systems: Secondary | ICD-10-CM

## 2017-09-27 DIAGNOSIS — M109 Gout, unspecified: Secondary | ICD-10-CM | POA: Diagnosis not present

## 2017-09-27 DIAGNOSIS — N390 Urinary tract infection, site not specified: Secondary | ICD-10-CM | POA: Diagnosis not present

## 2017-09-27 DIAGNOSIS — E7800 Pure hypercholesterolemia, unspecified: Secondary | ICD-10-CM | POA: Diagnosis not present

## 2017-09-27 DIAGNOSIS — I10 Essential (primary) hypertension: Secondary | ICD-10-CM | POA: Diagnosis not present

## 2017-09-30 NOTE — Consult Note (Signed)
            Shannon [name] CM Primary Care Navigator  09/30/2017  Shannon [name] Feb 03, 1931 [id number]  Attempt toseepatient at the bedsideto identify possible discharge needs but she was alreadydischargedhome over the weekend.   PerMD note, patient was admitted for increasing shortness of breath, with nonproductive cough; recent episode of lower extremity swelling to both legs, exacerbated by walking. Patient was admitted and treated for acute hypoxic respiratory failure secondary to acute diastolic heart failure exacerbation.  Primary care provider's officeis listed asprovidingtransition of care (TOC).  Patient hasdischarge instruction to follow-up withprimary care provider within 3 days post discharge and follow-up with cardiology in 10 days.  Primary care provider's office called (spoke to [facility] for [name]) to notify of patient's discharge and need for post hospital follow-up and transition of care (TOC). Notified of patient's health issues needing follow-up (mainly HF).  Made aware to refer patient to [facility] care management as deemed necessary and appropriate for services.   For additional questions please contact:  Edwena Felty A. Aliviah Spain, BSN, RN-BC [facility] PRIMARY CARE Navigator Cell: [phone]

## 2017-10-04 DIAGNOSIS — M2042 Other hammer toe(s) (acquired), left foot: Secondary | ICD-10-CM | POA: Diagnosis not present

## 2017-10-04 DIAGNOSIS — Z0000 Encounter for general adult medical examination without abnormal findings: Secondary | ICD-10-CM | POA: Diagnosis not present

## 2017-10-04 DIAGNOSIS — E7800 Pure hypercholesterolemia, unspecified: Secondary | ICD-10-CM | POA: Diagnosis not present

## 2017-10-04 DIAGNOSIS — E785 Hyperlipidemia, unspecified: Secondary | ICD-10-CM | POA: Diagnosis not present

## 2017-10-04 DIAGNOSIS — M109 Gout, unspecified: Secondary | ICD-10-CM | POA: Diagnosis not present

## 2017-10-04 DIAGNOSIS — I5030 Unspecified diastolic (congestive) heart failure: Secondary | ICD-10-CM | POA: Diagnosis not present

## 2017-10-04 DIAGNOSIS — K439 Ventral hernia without obstruction or gangrene: Secondary | ICD-10-CM | POA: Diagnosis not present

## 2017-10-04 DIAGNOSIS — I10 Essential (primary) hypertension: Secondary | ICD-10-CM | POA: Diagnosis not present

## 2017-10-05 ENCOUNTER — Encounter: Payer: Medicare Other | Admitting: Podiatry

## 2017-10-07 DIAGNOSIS — M1030 Gout due to renal impairment, unspecified site: Secondary | ICD-10-CM | POA: Diagnosis not present

## 2017-10-07 DIAGNOSIS — I080 Rheumatic disorders of both mitral and aortic valves: Secondary | ICD-10-CM | POA: Diagnosis not present

## 2017-10-07 DIAGNOSIS — Z7901 Long term (current) use of anticoagulants: Secondary | ICD-10-CM | POA: Diagnosis not present

## 2017-10-07 DIAGNOSIS — I5031 Acute diastolic (congestive) heart failure: Secondary | ICD-10-CM | POA: Diagnosis not present

## 2017-10-07 DIAGNOSIS — I130 Hypertensive heart and chronic kidney disease with heart failure and stage 1 through stage 4 chronic kidney disease, or unspecified chronic kidney disease: Secondary | ICD-10-CM | POA: Diagnosis not present

## 2017-10-07 DIAGNOSIS — N183 Chronic kidney disease, stage 3 (moderate): Secondary | ICD-10-CM | POA: Diagnosis not present

## 2017-10-07 DIAGNOSIS — I480 Paroxysmal atrial fibrillation: Secondary | ICD-10-CM | POA: Diagnosis not present

## 2017-10-11 ENCOUNTER — Encounter: Payer: Medicare Other | Admitting: Podiatry

## 2017-10-12 DIAGNOSIS — N183 Chronic kidney disease, stage 3 (moderate): Secondary | ICD-10-CM | POA: Diagnosis not present

## 2017-10-12 DIAGNOSIS — M1030 Gout due to renal impairment, unspecified site: Secondary | ICD-10-CM | POA: Diagnosis not present

## 2017-10-12 DIAGNOSIS — I480 Paroxysmal atrial fibrillation: Secondary | ICD-10-CM | POA: Diagnosis not present

## 2017-10-12 DIAGNOSIS — I5031 Acute diastolic (congestive) heart failure: Secondary | ICD-10-CM | POA: Diagnosis not present

## 2017-10-12 DIAGNOSIS — I080 Rheumatic disorders of both mitral and aortic valves: Secondary | ICD-10-CM | POA: Diagnosis not present

## 2017-10-12 DIAGNOSIS — I130 Hypertensive heart and chronic kidney disease with heart failure and stage 1 through stage 4 chronic kidney disease, or unspecified chronic kidney disease: Secondary | ICD-10-CM | POA: Diagnosis not present

## 2017-10-14 DIAGNOSIS — I129 Hypertensive chronic kidney disease with stage 1 through stage 4 chronic kidney disease, or unspecified chronic kidney disease: Secondary | ICD-10-CM | POA: Diagnosis not present

## 2017-10-14 DIAGNOSIS — R0602 Shortness of breath: Secondary | ICD-10-CM | POA: Diagnosis not present

## 2017-10-14 DIAGNOSIS — I5032 Chronic diastolic (congestive) heart failure: Secondary | ICD-10-CM | POA: Diagnosis not present

## 2017-10-14 DIAGNOSIS — I480 Paroxysmal atrial fibrillation: Secondary | ICD-10-CM | POA: Diagnosis not present

## 2017-10-15 DIAGNOSIS — I080 Rheumatic disorders of both mitral and aortic valves: Secondary | ICD-10-CM | POA: Diagnosis not present

## 2017-10-15 DIAGNOSIS — I130 Hypertensive heart and chronic kidney disease with heart failure and stage 1 through stage 4 chronic kidney disease, or unspecified chronic kidney disease: Secondary | ICD-10-CM | POA: Diagnosis not present

## 2017-10-15 DIAGNOSIS — I5031 Acute diastolic (congestive) heart failure: Secondary | ICD-10-CM | POA: Diagnosis not present

## 2017-10-15 DIAGNOSIS — N183 Chronic kidney disease, stage 3 (moderate): Secondary | ICD-10-CM | POA: Diagnosis not present

## 2017-10-15 DIAGNOSIS — M1030 Gout due to renal impairment, unspecified site: Secondary | ICD-10-CM | POA: Diagnosis not present

## 2017-10-15 DIAGNOSIS — I480 Paroxysmal atrial fibrillation: Secondary | ICD-10-CM | POA: Diagnosis not present

## 2017-10-18 ENCOUNTER — Ambulatory Visit (INDEPENDENT_AMBULATORY_CARE_PROVIDER_SITE_OTHER): Payer: Medicare Other | Admitting: Podiatry

## 2017-10-18 DIAGNOSIS — I5031 Acute diastolic (congestive) heart failure: Secondary | ICD-10-CM | POA: Diagnosis not present

## 2017-10-18 DIAGNOSIS — I130 Hypertensive heart and chronic kidney disease with heart failure and stage 1 through stage 4 chronic kidney disease, or unspecified chronic kidney disease: Secondary | ICD-10-CM | POA: Diagnosis not present

## 2017-10-18 DIAGNOSIS — Q828 Other specified congenital malformations of skin: Secondary | ICD-10-CM

## 2017-10-18 DIAGNOSIS — N183 Chronic kidney disease, stage 3 (moderate): Secondary | ICD-10-CM | POA: Diagnosis not present

## 2017-10-18 DIAGNOSIS — M2042 Other hammer toe(s) (acquired), left foot: Secondary | ICD-10-CM

## 2017-10-18 DIAGNOSIS — M1030 Gout due to renal impairment, unspecified site: Secondary | ICD-10-CM | POA: Diagnosis not present

## 2017-10-18 DIAGNOSIS — Z7901 Long term (current) use of anticoagulants: Secondary | ICD-10-CM

## 2017-10-18 DIAGNOSIS — I480 Paroxysmal atrial fibrillation: Secondary | ICD-10-CM | POA: Diagnosis not present

## 2017-10-18 DIAGNOSIS — I080 Rheumatic disorders of both mitral and aortic valves: Secondary | ICD-10-CM | POA: Diagnosis not present

## 2017-10-20 NOTE — Progress Notes (Signed)
Subjective: Shannon [name] presents the office today for follow-up evaluation of a callus of the left second toe as well as hammertoe deformity.  She also has calluses to her right foot that should have trimmed.  Unfortunately her surgery had to be postponed due to congestive heart failure and hospitalization.  Denies any open sores or redness or drainage or any swelling. Denies any systemic complaints such as fevers, chills, nausea, vomiting. No acute changes since last appointment, and no other complaints at this time.   Objective: AAO x3, NAD DP/PT pulses palpable bilaterally, CRT less than 3 seconds Hammertoe contractures present most notably the left second toe.  Hyperkeratotic lesion of the left distal second toe as well as right submetatarsal 1 and 5.  Upon debridement no underlying ulceration drainage or any signs of infection noted today. No open lesions or pre-ulcerative lesions.  No pain with calf compression, swelling, warmth, erythema  Assessment: Hammertoe deformity left second toe with pre-ulcerative calluses x3  Plan: -All treatment options discussed with the patient including all alternatives, risks, complications.  -Hyperkeratotic lesions are debrided x3 without any complications or bleeding.  Continue offloading all times.  She was told to proceed with surgery in the future but unfortunately have to hold off on this until she is medically cleared for surgery.  Continue to monitor her feet daily for any skin breakdown. Offloading for now.  -Patient encouraged to call the office with any questions, concerns, change in symptoms.   Trula Slade DPM

## 2017-10-21 DIAGNOSIS — I130 Hypertensive heart and chronic kidney disease with heart failure and stage 1 through stage 4 chronic kidney disease, or unspecified chronic kidney disease: Secondary | ICD-10-CM | POA: Diagnosis not present

## 2017-10-21 DIAGNOSIS — M1030 Gout due to renal impairment, unspecified site: Secondary | ICD-10-CM | POA: Diagnosis not present

## 2017-10-21 DIAGNOSIS — I480 Paroxysmal atrial fibrillation: Secondary | ICD-10-CM | POA: Diagnosis not present

## 2017-10-21 DIAGNOSIS — I5031 Acute diastolic (congestive) heart failure: Secondary | ICD-10-CM | POA: Diagnosis not present

## 2017-10-21 DIAGNOSIS — I080 Rheumatic disorders of both mitral and aortic valves: Secondary | ICD-10-CM | POA: Diagnosis not present

## 2017-10-21 DIAGNOSIS — N183 Chronic kidney disease, stage 3 (moderate): Secondary | ICD-10-CM | POA: Diagnosis not present

## 2017-10-27 DIAGNOSIS — I5031 Acute diastolic (congestive) heart failure: Secondary | ICD-10-CM | POA: Diagnosis not present

## 2017-10-27 DIAGNOSIS — M1030 Gout due to renal impairment, unspecified site: Secondary | ICD-10-CM | POA: Diagnosis not present

## 2017-10-27 DIAGNOSIS — I080 Rheumatic disorders of both mitral and aortic valves: Secondary | ICD-10-CM | POA: Diagnosis not present

## 2017-10-27 DIAGNOSIS — N183 Chronic kidney disease, stage 3 (moderate): Secondary | ICD-10-CM | POA: Diagnosis not present

## 2017-10-27 DIAGNOSIS — I480 Paroxysmal atrial fibrillation: Secondary | ICD-10-CM | POA: Diagnosis not present

## 2017-10-27 DIAGNOSIS — I130 Hypertensive heart and chronic kidney disease with heart failure and stage 1 through stage 4 chronic kidney disease, or unspecified chronic kidney disease: Secondary | ICD-10-CM | POA: Diagnosis not present

## 2017-11-04 DIAGNOSIS — I5030 Unspecified diastolic (congestive) heart failure: Secondary | ICD-10-CM | POA: Diagnosis not present

## 2017-11-10 DIAGNOSIS — I10 Essential (primary) hypertension: Secondary | ICD-10-CM | POA: Diagnosis not present

## 2017-11-10 DIAGNOSIS — E789 Disorder of lipoprotein metabolism, unspecified: Secondary | ICD-10-CM | POA: Diagnosis not present

## 2017-11-10 DIAGNOSIS — N289 Disorder of kidney and ureter, unspecified: Secondary | ICD-10-CM | POA: Diagnosis not present

## 2017-11-18 DIAGNOSIS — N289 Disorder of kidney and ureter, unspecified: Secondary | ICD-10-CM | POA: Diagnosis not present

## 2017-11-24 DIAGNOSIS — N183 Chronic kidney disease, stage 3 (moderate): Secondary | ICD-10-CM | POA: Diagnosis not present

## 2017-11-24 DIAGNOSIS — E7800 Pure hypercholesterolemia, unspecified: Secondary | ICD-10-CM | POA: Diagnosis not present

## 2017-11-24 DIAGNOSIS — I10 Essential (primary) hypertension: Secondary | ICD-10-CM | POA: Diagnosis not present

## 2017-12-21 ENCOUNTER — Encounter: Payer: Self-pay | Admitting: Podiatry

## 2017-12-21 ENCOUNTER — Ambulatory Visit (INDEPENDENT_AMBULATORY_CARE_PROVIDER_SITE_OTHER): Payer: Medicare Other | Admitting: Podiatry

## 2017-12-21 DIAGNOSIS — M79675 Pain in left toe(s): Secondary | ICD-10-CM

## 2017-12-21 DIAGNOSIS — Q828 Other specified congenital malformations of skin: Secondary | ICD-10-CM

## 2017-12-21 DIAGNOSIS — M79674 Pain in right toe(s): Secondary | ICD-10-CM | POA: Diagnosis not present

## 2017-12-21 DIAGNOSIS — Z7901 Long term (current) use of anticoagulants: Secondary | ICD-10-CM | POA: Diagnosis not present

## 2017-12-21 DIAGNOSIS — B351 Tinea unguium: Secondary | ICD-10-CM

## 2017-12-21 DIAGNOSIS — L84 Corns and callosities: Secondary | ICD-10-CM | POA: Diagnosis not present

## 2017-12-22 NOTE — Progress Notes (Signed)
Subjective: Nataleigh presents the office today for follow-up evaluation of a callus of the left second toe as well as hammertoe deformity as well as for painful, elongated toenails that she cannot trim herself.  She states the callus in the left second toe started causing issues again.  The nails are elongated causing irritation inside shoes.  She denies any redness or drainage or any swelling.  She has no other concerns.  Denies any open sores.  Objective: AAO x3, NAD DP/PT pulses palpable bilaterally, CRT less than 3 seconds Hammertoe contractures present most notably the left second toe.  Hyperkeratotic lesion of the left distal second toe as well as right submetatarsal 1 and 5.  Upon debridement no underlying ulceration drainage or any signs of infection noted today. No open lesions or pre-ulcerative lesions.  No pain with calf compression, swelling, warmth, erythema  Assessment: Hammertoe deformity left second toe with pre-ulcerative calluses x3; onychomycosis  Plan: -All treatment options discussed with the patient including all alternatives, risks, complications.  -Hyperkeratotic lesions are debrided x3 without any complications or bleeding.  Dispensed further offloading pads to the left second toe. -Nails sharply debrided x10 without any complications or bleeding -Patient encouraged to call the office with any questions, concerns, change in symptoms.   Trula Slade DPM

## 2018-01-19 DIAGNOSIS — I5032 Chronic diastolic (congestive) heart failure: Secondary | ICD-10-CM | POA: Diagnosis not present

## 2018-01-19 DIAGNOSIS — R0602 Shortness of breath: Secondary | ICD-10-CM | POA: Diagnosis not present

## 2018-01-19 DIAGNOSIS — I129 Hypertensive chronic kidney disease with stage 1 through stage 4 chronic kidney disease, or unspecified chronic kidney disease: Secondary | ICD-10-CM | POA: Diagnosis not present

## 2018-01-19 DIAGNOSIS — I480 Paroxysmal atrial fibrillation: Secondary | ICD-10-CM | POA: Diagnosis not present

## 2018-02-21 ENCOUNTER — Ambulatory Visit (INDEPENDENT_AMBULATORY_CARE_PROVIDER_SITE_OTHER): Payer: Medicare Other | Admitting: Podiatry

## 2018-02-21 ENCOUNTER — Encounter: Payer: Self-pay | Admitting: Podiatry

## 2018-02-21 DIAGNOSIS — N183 Chronic kidney disease, stage 3 (moderate): Secondary | ICD-10-CM | POA: Diagnosis not present

## 2018-02-21 DIAGNOSIS — Q828 Other specified congenital malformations of skin: Secondary | ICD-10-CM | POA: Diagnosis not present

## 2018-02-21 DIAGNOSIS — I10 Essential (primary) hypertension: Secondary | ICD-10-CM | POA: Diagnosis not present

## 2018-02-21 DIAGNOSIS — M79675 Pain in left toe(s): Secondary | ICD-10-CM

## 2018-02-21 DIAGNOSIS — E7800 Pure hypercholesterolemia, unspecified: Secondary | ICD-10-CM | POA: Diagnosis not present

## 2018-02-21 DIAGNOSIS — M79674 Pain in right toe(s): Secondary | ICD-10-CM | POA: Diagnosis not present

## 2018-02-21 DIAGNOSIS — B351 Tinea unguium: Secondary | ICD-10-CM | POA: Diagnosis not present

## 2018-02-21 DIAGNOSIS — Z7901 Long term (current) use of anticoagulants: Secondary | ICD-10-CM

## 2018-02-23 NOTE — Progress Notes (Signed)
Subjective: Shey presents the office today for follow-up evaluation of a callus of the left second toe as well as hammertoe deformity as well as for painful, elongated toenails that she cannot trim herself.  She states the proximal left second toe has been doing very well but she has majority of pain to the callus in the right foot submetatarsal 1.  Denies any changes since the last saw her.  She denies any redness or drainage or any swelling.  No other concerns.   Objective: AAO x3, NAD DP/PT pulses palpable bilaterally, CRT less than 3 seconds Hammertoe contractures present to left third toe, right submetatarsal 1, right fifth metatarsal base.  Upon debridement there is no underlying ulceration drainage or any signs of infection. Nails are hypertrophic, dystrophic, discolored and elongated x10.  No surrounding redness or drainage.  Tenderness nails 1-5 bilaterally. Hammertoes present. No pain with calf compression, swelling, warmth, erythema  Assessment: Hammertoe deformity left second toe with pre-ulcerative calluses x3; symptomatic onychomycosis  Plan: -All treatment options discussed with the patient including all alternatives, risks, complications.  -Hyperkeratotic lesions are debrided x3 without any complications or bleeding.  Dispensed further offloading pads to the left second toe. -Nails sharply debrided x10 without any complications or bleeding -Patient encouraged to call the office with any questions, concerns, change in symptoms.   Trula Slade DPM

## 2018-02-28 DIAGNOSIS — I480 Paroxysmal atrial fibrillation: Secondary | ICD-10-CM | POA: Diagnosis not present

## 2018-02-28 DIAGNOSIS — E7800 Pure hypercholesterolemia, unspecified: Secondary | ICD-10-CM | POA: Diagnosis not present

## 2018-02-28 DIAGNOSIS — M109 Gout, unspecified: Secondary | ICD-10-CM | POA: Diagnosis not present

## 2018-02-28 DIAGNOSIS — N183 Chronic kidney disease, stage 3 (moderate): Secondary | ICD-10-CM | POA: Diagnosis not present

## 2018-02-28 DIAGNOSIS — Z23 Encounter for immunization: Secondary | ICD-10-CM | POA: Diagnosis not present

## 2018-02-28 DIAGNOSIS — I10 Essential (primary) hypertension: Secondary | ICD-10-CM | POA: Diagnosis not present

## 2018-02-28 DIAGNOSIS — I701 Atherosclerosis of renal artery: Secondary | ICD-10-CM | POA: Diagnosis not present

## 2018-02-28 DIAGNOSIS — I5030 Unspecified diastolic (congestive) heart failure: Secondary | ICD-10-CM | POA: Diagnosis not present

## 2018-02-28 DIAGNOSIS — R7309 Other abnormal glucose: Secondary | ICD-10-CM | POA: Diagnosis not present

## 2018-03-11 ENCOUNTER — Encounter (HOSPITAL_COMMUNITY): Payer: Self-pay | Admitting: Emergency Medicine

## 2018-03-11 ENCOUNTER — Emergency Department (HOSPITAL_COMMUNITY): Payer: Medicare Other

## 2018-03-11 ENCOUNTER — Emergency Department (HOSPITAL_COMMUNITY)
Admission: EM | Admit: 2018-03-11 | Discharge: 2018-03-11 | Disposition: A | Discharge status: Home / Self Care | Payer: Medicare Other | Attending: Emergency Medicine | Admitting: Emergency Medicine

## 2018-03-11 DIAGNOSIS — Z87891 Personal history of nicotine dependence: Secondary | ICD-10-CM | POA: Insufficient documentation

## 2018-03-11 DIAGNOSIS — N189 Chronic kidney disease, unspecified: Secondary | ICD-10-CM | POA: Insufficient documentation

## 2018-03-11 DIAGNOSIS — Z79899 Other long term (current) drug therapy: Secondary | ICD-10-CM | POA: Diagnosis not present

## 2018-03-11 DIAGNOSIS — R0602 Shortness of breath: Secondary | ICD-10-CM | POA: Diagnosis not present

## 2018-03-11 DIAGNOSIS — I509 Heart failure, unspecified: Secondary | ICD-10-CM | POA: Diagnosis not present

## 2018-03-11 DIAGNOSIS — I130 Hypertensive heart and chronic kidney disease with heart failure and stage 1 through stage 4 chronic kidney disease, or unspecified chronic kidney disease: Secondary | ICD-10-CM | POA: Diagnosis not present

## 2018-03-11 DIAGNOSIS — Z7901 Long term (current) use of anticoagulants: Secondary | ICD-10-CM | POA: Insufficient documentation

## 2018-03-11 DIAGNOSIS — I110 Hypertensive heart disease with heart failure: Secondary | ICD-10-CM | POA: Diagnosis not present

## 2018-03-11 DIAGNOSIS — M7989 Other specified soft tissue disorders: Secondary | ICD-10-CM | POA: Diagnosis not present

## 2018-03-11 LAB — CBC WITH DIFFERENTIAL/PLATELET
ABS IMMATURE GRANULOCYTES: 0 10*3/uL (ref 0.0–0.1)
Basophils Absolute: 0 10*3/uL (ref 0.0–0.1)
Basophils Relative: 0 %
Eosinophils Absolute: 0.2 10*3/uL (ref 0.0–0.7)
Eosinophils Relative: 4 %
HEMATOCRIT: 42.9 % (ref 36.0–46.0)
Hemoglobin: 13.5 g/dL (ref 12.0–15.0)
IMMATURE GRANULOCYTES: 1 %
LYMPHS ABS: 1.3 10*3/uL (ref 0.7–4.0)
LYMPHS PCT: 23 %
MCH: 31.4 pg (ref 26.0–34.0)
MCHC: 31.5 g/dL (ref 30.0–36.0)
MCV: 99.8 fL (ref 78.0–100.0)
MONOS PCT: 11 %
Monocytes Absolute: 0.6 10*3/uL (ref 0.1–1.0)
NEUTROS ABS: 3.5 10*3/uL (ref 1.7–7.7)
NEUTROS PCT: 61 %
PLATELETS: 190 10*3/uL (ref 150–400)
RBC: 4.3 MIL/uL (ref 3.87–5.11)
RDW: 13.2 % (ref 11.5–15.5)
WBC: 5.6 10*3/uL (ref 4.0–10.5)

## 2018-03-11 LAB — COMPREHENSIVE METABOLIC PANEL
ALBUMIN: 3.7 g/dL (ref 3.5–5.0)
ALT: 16 U/L (ref 0–44)
AST: 25 U/L (ref 15–41)
Alkaline Phosphatase: 77 U/L (ref 38–126)
Anion gap: 9 (ref 5–15)
BILIRUBIN TOTAL: 0.7 mg/dL (ref 0.3–1.2)
BUN: 25 mg/dL — ABNORMAL HIGH (ref 8–23)
CALCIUM: 9.2 mg/dL (ref 8.9–10.3)
CO2: 25 mmol/L (ref 22–32)
Chloride: 105 mmol/L (ref 98–111)
Creatinine, Ser: 1.43 mg/dL — ABNORMAL HIGH (ref 0.44–1.00)
GFR calc Af Amer: 38 mL/min — ABNORMAL LOW (ref >60)
GFR, EST NON AFRICAN AMERICAN: 32 mL/min — AB (ref >60)
Glucose, Bld: 133 mg/dL — ABNORMAL HIGH (ref 70–99)
POTASSIUM: 4.2 mmol/L (ref 3.5–5.1)
Sodium: 139 mmol/L (ref 135–145)
TOTAL PROTEIN: 7.7 g/dL (ref 6.5–8.1)

## 2018-03-11 LAB — I-STAT TROPONIN, ED: Troponin i, poc: 0.02 ng/mL (ref 0.00–0.08)

## 2018-03-11 LAB — BRAIN NATRIURETIC PEPTIDE: B NATRIURETIC PEPTIDE 5: 339.3 pg/mL — AB (ref 0.0–100.0)

## 2018-03-11 MED ORDER — FUROSEMIDE 10 MG/ML IJ SOLN
40.0000 mg | Freq: Once | INTRAMUSCULAR | Status: AC
  Administered 2018-03-11: 40 mg via INTRAVENOUS
  Filled 2018-03-11: qty 4

## 2018-03-11 NOTE — ED Triage Notes (Signed)
Patient reports SOB with occasional dry cough onset this morning with mild lower legs/feet swelling , denies fever ( T=98.7) or chills.

## 2018-03-11 NOTE — Discharge Instructions (Addendum)
Make an appointment with your cardiologist Dr. Einar Gip for follow-up.  Take the torsemide 20 mg twice a day as prescribed.  You do not need to take your morning dose today.  Try to avoid salty foods.  Come back to the ER if you have any new or concerning symptoms like worsening shortness of breath, chest pain, feeling as if you are going to pass out.

## 2018-03-11 NOTE — ED Provider Notes (Signed)
[facility] EMERGENCY DEPARTMENT Provider Note   CSN: [id number] Arrival date & time: 03/11/18  0340     History   Chief Complaint Chief Complaint  Patient presents with  . Shortness of Breath    HPI Shannon [name] is a 84 y.o. female.  HPI   Shannon [name] is an 84 year old female with a history of atrial fibrillation (on Xarelto), CHF (EF 50 to 55%), hypertension, hyperlipidemia, CKD, gout who presents to the emergency department for evaluation of shortness of breath which began early this morning around 1 AM.  Patient reports that she feels as if she is building up fluid.  For the past several weeks she has noticed bilateral leg swelling.  This morning she felt short of breath and had to use her husband's oxygen machine.  She reports that she has a mild dry cough, denies fever.  States that she has been taking her torsemide as prescribed (20 mg twice daily) disease.  She denies chest pain, lightheadedness, diaphoresis, nausea/vomiting, abdominal pain, headache, congestion, sore throat, body aches.  She states the last of this happened she had to be admitted to the hospital for CHF exacerbation.  Past Medical History:  Diagnosis Date  . Arthritis    "all over"  . Atrial fibrillation ([place])   . Chronic kidney disease   . Dyspnea   . Gout   . Hypercholesteremia   . Hypertension   . Mitral regurgitation     Patient Active Problem List   Diagnosis Date Noted  . Hyperlipidemia 09/24/2017  . Hypertension 09/24/2017  . Acute respiratory failure with hypoxia ([place]) 09/24/2017  . Congestive heart failure (CHF) ([place]) 09/24/2017  . Gout 09/24/2017  . Acute exacerbation of CHF (congestive heart failure) ([place]) 09/24/2017  . Atrial fibrillation ([place]) 10/25/2016  . Renal arterial hypertension 07/31/2014  . Renovascular hypertension 07/30/2014    Past Surgical History:  Procedure Laterality Date  . BREAST BIOPSY Left 1984  . CARDIOVERSION N/A 10/27/2016   Procedure:  CARDIOVERSION;  Surgeon: Adrian Prows, MD;  Location: [facility];  Service: Cardiovascular;  Laterality: N/A;  . CATARACT EXTRACTION, BILATERAL Bilateral 2000's  . RENAL ANGIOGRAM Bilateral 07/31/2014   Procedure: RENAL ANGIOGRAM;  Surgeon: Laverda Page, MD;  Location: [facility] CATH LAB;  Service: Cardiovascular;  Laterality: Bilateral;     OB History   None      Home Medications    Prior to Admission medications   Medication Sig Start Date End Date Taking? Authorizing Provider  amLODipine (NORVASC) 10 MG tablet Take 1 tablet (10 mg total) by mouth daily. 09/27/17   Kayleen Memos, DO  colchicine 0.6 MG tablet Take 0.6 mg by mouth daily.      [provider]  febuxostat (ULORIC) 40 MG tablet Take 40 mg by mouth every morning.     [provider]  hydrALAZINE (APRESOLINE) 50 MG tablet Take 50 mg by mouth 3 (three) times daily.    [provider]  irbesartan (AVAPRO) 150 MG tablet Take 1 tablet (150 mg total) by mouth daily. 09/27/17   Hall, Carole N, DO  LUMIGAN 0.01 % SOLN Place 1 drop into both eyes at bedtime. 09/22/17   [provider]  pindolol (VISKEN) 5 MG tablet Take 1 tablet (5 mg total) by mouth 2 (two) times daily. 09/26/17   Kayleen Memos, DO  Potassium Chloride ER 20 MEQ TBCR TK 1 T PO ONCE D 11/25/17   [provider]  potassium chloride SA (  K-DUR,KLOR-CON) 20 MEQ tablet Take 1 tablet (20 mEq total) by mouth daily. 09/26/17   Kayleen Memos, DO  Rivaroxaban (XARELTO) 15 MG TABS tablet Take 15 mg by mouth daily with supper.    [provider]  rosuvastatin (CRESTOR) 20 MG tablet Take 20 mg by mouth every evening.      [provider]  torsemide (DEMADEX) 20 MG tablet Take 1 tablet (20 mg total) by mouth 2 (two) times daily at 10 am and 4 pm. 09/26/17   Kayleen Memos, DO  TURMERIC PO Take 1 tablet by mouth daily.    [provider]    Family History Family History  Problem Relation Age of Onset  . Breast  cancer Neg Hx     Social History Social History   Tobacco Use  . Smoking status: Never Smoker  . Smokeless tobacco: Former Systems developer    Types: Snuff  . Tobacco comment: "quit using snuff in the 1960's"  Substance Use Topics  . Alcohol use: No  . Drug use: No     Allergies   Zestoretic [lisinopril-hydrochlorothiazide]   Review of Systems Review of Systems  Constitutional: Negative for chills, diaphoresis and fever.  HENT: Negative for congestion, ear pain, rhinorrhea and sore throat.   Respiratory: Positive for cough and shortness of breath. Negative for wheezing.   Cardiovascular: Positive for leg swelling. Negative for chest pain.  Gastrointestinal: Negative for abdominal pain, nausea and vomiting.  Genitourinary: Negative for difficulty urinating.  Musculoskeletal: Negative for back pain.  Skin: Negative for rash.  Neurological: Negative for dizziness, weakness, light-headedness and headaches.  Psychiatric/Behavioral: Negative for agitation.     Physical Exam Updated Vital Signs BP (!) 143/62   Pulse (!) 56   Temp 98.3 F (36.8 C) (Oral)   Resp 18   SpO2 96%   Physical Exam  Constitutional: She appears well-developed and well-nourished. No distress.  HENT:  Head: Normocephalic and atraumatic.  Eyes: Right eye exhibits no discharge. Left eye exhibits no discharge.  Cardiovascular: Intact distal pulses.  Regular rate, irregular rhythm.  Pulmonary/Chest: Effort normal. No respiratory distress.  On 2 L nasal cannula with O2 sat 99%.  No respiratory distress, speaking in full sentences.  Bilateral basilar inspiratory crackles.  No wheezes, rhonchi or stridor.  Neurological: She is alert. Coordination normal.  Skin: She is not diaphoretic.  Psychiatric: She has a normal mood and affect. Her behavior is normal.  Nursing note and vitals reviewed.    ED Treatments / Results  Labs (all labs ordered are listed, but only abnormal results are displayed) Labs Reviewed    COMPREHENSIVE METABOLIC PANEL - Abnormal; Notable for the following components:      Result Value   Glucose, Bld 133 (*)    BUN 25 (*)    Creatinine, Ser 1.43 (*)    GFR calc non Af Amer 32 (*)    GFR calc Af Amer 38 (*)    All other components within normal limits  BRAIN NATRIURETIC PEPTIDE - Abnormal; Notable for the following components:   B Natriuretic Peptide 339.3 (*)    All other components within normal limits  CBC WITH DIFFERENTIAL/PLATELET  I-STAT TROPONIN, ED    EKG EKG Interpretation  Date/Time:  Friday March 11 2018 07:38:49 EDT Ventricular Rate:  70 PR Interval:  206 QRS Duration: 96 QT Interval:  441 QTC Calculation: 476 R Axis:   41 Text Interpretation:  Sinus rhythm Prolonged PR interval Anterior infarct, old Confirmed by Ralene Bathe,  Benjamine Mola [phone]) on 03/11/2018 8:01:11 AM   Radiology Dg Chest 2 View  Result Date: 03/11/2018 CLINICAL DATA:  84 year old female with shortness of breath. EXAM: CHEST - 2 VIEW COMPARISON:  Chest radiograph dated 09/26/2017 FINDINGS: Probable small left pleural effusion and associated left lung base atelectasis. Infiltrate is not excluded. There is no pneumothorax. Stable cardiomegaly. Atherosclerotic calcification of the aorta. No acute osseous pathology. IMPRESSION: Cardiomegaly with mild vascular congestion and probable left pleural effusion and left lung base atelectasis versus infiltrate. Electronically Signed   By: Anner Crete M.D.   On: 03/11/2018 05:28    Procedures Procedures (including critical care time)  Medications Ordered in ED Medications  furosemide (LASIX) injection 40 mg (40 mg Intravenous Given 03/11/18 0738)     Initial Impression / Assessment and Plan / ED Course  I have reviewed the triage vital signs and the nursing notes.  Pertinent labs & imaging results that were available during my care of the patient were reviewed by me and considered in my medical decision making (see chart for details).      Symptoms consistent with CHF exacerbation.  No respiratory distress, she has some faint crackles in the lung bases.  No JVD. Chest x-ray reveals vascular congestion.  Her BNP is at baseline.  I doubt ACS given no chest pain, EKG nonischemic and troponin negative.  Her CMP reveals mildly elevated creatinine compared to her baseline (1.43 versus 1.1).  She is able to ambulate in the hallway with O2 sat greater than 90% on room air.  I discussed this patient with her cardiologist Dr. Einar Gip who recommends 40 mg IV Lasix and outpatient follow-up with cardiology.  I think that this is appropriate.  I counseled her on return precautions.  This was a shared visit with Dr. Ralene Bathe who also saw the patient and agrees with plan to discharge.  Final Clinical Impressions(s) / ED Diagnoses   Final diagnoses:  Acute on chronic congestive heart failure, unspecified heart failure type [facility])    ED Discharge Orders    None       Bernarda Caffey 03/11/18 1001    Quintella Reichert, MD 03/12/18 [phone]

## 2018-03-11 NOTE — ED Notes (Signed)
Patient given discharge instructions and verbalized understanding.  Patient stable to discharge at this time.  Patient is alert and oriented to baseline.  No distressed noted at this time.  All belongings taken with the patient at discharge.   

## 2018-03-11 NOTE — ED Notes (Signed)
Pt sats stayed above 90% with ambulation. Pt reported no issues.

## 2018-04-06 DIAGNOSIS — I480 Paroxysmal atrial fibrillation: Secondary | ICD-10-CM | POA: Diagnosis not present

## 2018-04-06 DIAGNOSIS — I5032 Chronic diastolic (congestive) heart failure: Secondary | ICD-10-CM | POA: Diagnosis not present

## 2018-04-06 DIAGNOSIS — I129 Hypertensive chronic kidney disease with stage 1 through stage 4 chronic kidney disease, or unspecified chronic kidney disease: Secondary | ICD-10-CM | POA: Diagnosis not present

## 2018-04-06 DIAGNOSIS — R0602 Shortness of breath: Secondary | ICD-10-CM | POA: Diagnosis not present

## 2018-04-18 ENCOUNTER — Other Ambulatory Visit: Payer: Self-pay | Admitting: Anesthesiology

## 2018-04-18 DIAGNOSIS — Z1231 Encounter for screening mammogram for malignant neoplasm of breast: Secondary | ICD-10-CM

## 2018-04-26 ENCOUNTER — Ambulatory Visit (INDEPENDENT_AMBULATORY_CARE_PROVIDER_SITE_OTHER): Payer: Medicare Other | Admitting: Podiatry

## 2018-04-26 DIAGNOSIS — M79674 Pain in right toe(s): Secondary | ICD-10-CM | POA: Diagnosis not present

## 2018-04-26 DIAGNOSIS — Z7901 Long term (current) use of anticoagulants: Secondary | ICD-10-CM | POA: Diagnosis not present

## 2018-04-26 DIAGNOSIS — M79675 Pain in left toe(s): Secondary | ICD-10-CM | POA: Diagnosis not present

## 2018-04-26 DIAGNOSIS — B351 Tinea unguium: Secondary | ICD-10-CM | POA: Diagnosis not present

## 2018-04-26 DIAGNOSIS — Q828 Other specified congenital malformations of skin: Secondary | ICD-10-CM

## 2018-04-27 NOTE — Progress Notes (Signed)
Subjective: Shannon [name] presents the office today for follow-up evaluation calluses to both of her feet as well as for painful, elongated toenails that she cannot trim herself.  She denies any redness or drainage from the callus or toenail sites.  Denies any changes since the last saw her. No other concerns.   Objective: AAO x3, NAD DP/PT pulses palpable bilaterally, CRT less than 3 seconds Hammertoe contractures present to left third toe, right submetatarsal 1, right fifth metatarsal base.  Upon debridement there is no underlying ulceration drainage or any signs of infection. Nails are hypertrophic, dystrophic, discolored and elongated x10.  No surrounding redness or drainage.  Tenderness nails 1-5 bilaterally. Hammertoes present. No pain with calf compression, swelling, warmth, erythema  Assessment: Hammertoe deformity left second toe with pre-ulcerative calluses x3; symptomatic onychomycosis  Plan: -All treatment options discussed with the patient including all alternatives, risks, complications.  -Hyperkeratotic lesions are debrided x3 without any complications or bleeding.  Dispensed further offloading pads to the left second toe. -Nails sharply debrided x10 without any complications or bleeding -Patient encouraged to call the office with any questions, concerns, change in symptoms.   Trula Slade DPM

## 2018-05-25 IMAGING — DX DG CHEST 1V PORT
1 series · 1 of 1 positions shown · non-contrast
Comparison: 02/03/2010.

CLINICAL DATA: Shortness of breath.  Chest pain

EXAM:
PORTABLE CHEST 1 VIEW

[chest]
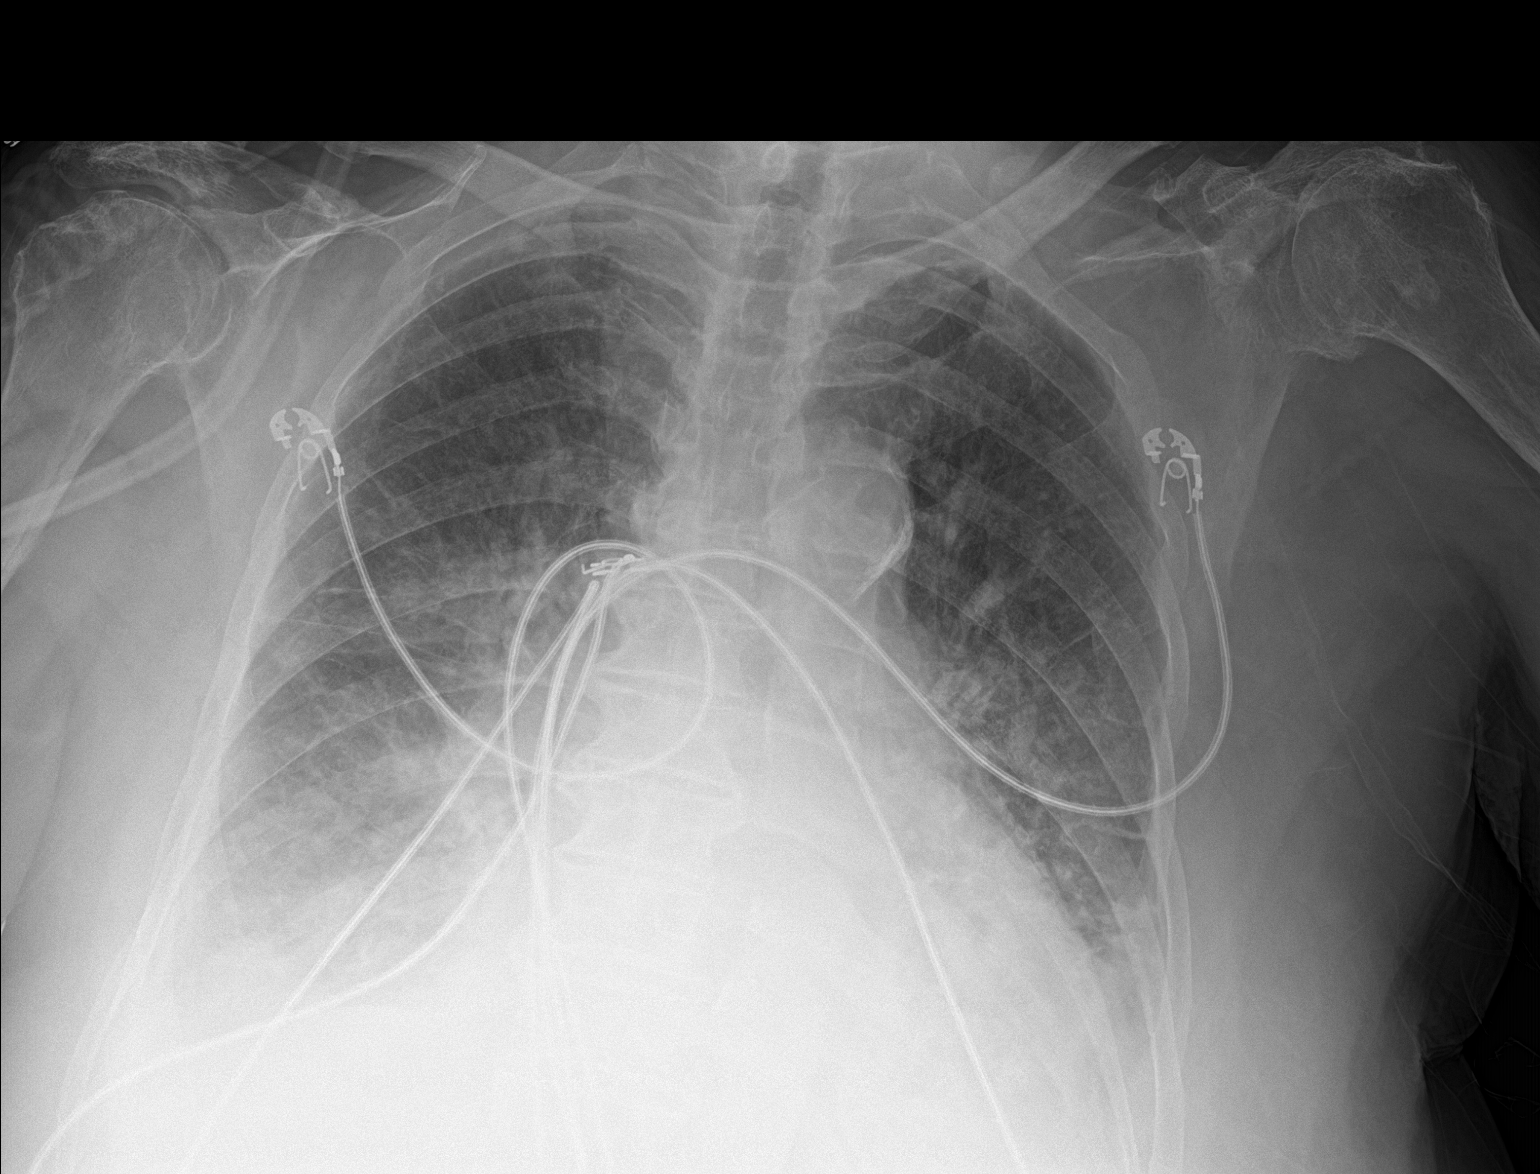

[1 of 1 positions shown; findings below may reference images not displayed]

FINDINGS: Cardiomegaly with pulmonary vascular prominence and diffuse
bilateral pulmonary infiltrates with bilateral pleural effusions.
Findings consistent with CHF. Degenerative changes both shoulders
and thoracic spine.
IMPRESSION: Congestive heart failure with bilateral pulmonary edema and
bilateral pleural effusions.

## 2018-05-30 ENCOUNTER — Ambulatory Visit
Admission: RE | Admit: 2018-05-30 | Discharge: 2018-05-30 | Disposition: A | Discharge status: Home / Self Care | Payer: Medicare Other | Source: Ambulatory Visit | Attending: Anesthesiology | Admitting: Anesthesiology

## 2018-05-30 DIAGNOSIS — Z1231 Encounter for screening mammogram for malignant neoplasm of breast: Secondary | ICD-10-CM | POA: Diagnosis not present

## 2018-06-21 DIAGNOSIS — M109 Gout, unspecified: Secondary | ICD-10-CM | POA: Diagnosis not present

## 2018-06-21 DIAGNOSIS — I10 Essential (primary) hypertension: Secondary | ICD-10-CM | POA: Diagnosis not present

## 2018-06-21 DIAGNOSIS — N183 Chronic kidney disease, stage 3 (moderate): Secondary | ICD-10-CM | POA: Diagnosis not present

## 2018-06-21 DIAGNOSIS — R7309 Other abnormal glucose: Secondary | ICD-10-CM | POA: Diagnosis not present

## 2018-06-21 DIAGNOSIS — I5030 Unspecified diastolic (congestive) heart failure: Secondary | ICD-10-CM | POA: Diagnosis not present

## 2018-06-21 DIAGNOSIS — E7800 Pure hypercholesterolemia, unspecified: Secondary | ICD-10-CM | POA: Diagnosis not present

## 2018-06-28 ENCOUNTER — Ambulatory Visit (INDEPENDENT_AMBULATORY_CARE_PROVIDER_SITE_OTHER): Payer: Medicare Other | Admitting: Podiatry

## 2018-06-28 DIAGNOSIS — I10 Essential (primary) hypertension: Secondary | ICD-10-CM | POA: Diagnosis not present

## 2018-06-28 DIAGNOSIS — Z7901 Long term (current) use of anticoagulants: Secondary | ICD-10-CM | POA: Diagnosis not present

## 2018-06-28 DIAGNOSIS — M79675 Pain in left toe(s): Secondary | ICD-10-CM | POA: Diagnosis not present

## 2018-06-28 DIAGNOSIS — Q828 Other specified congenital malformations of skin: Secondary | ICD-10-CM

## 2018-06-28 DIAGNOSIS — M79674 Pain in right toe(s): Secondary | ICD-10-CM | POA: Diagnosis not present

## 2018-06-28 DIAGNOSIS — E7800 Pure hypercholesterolemia, unspecified: Secondary | ICD-10-CM | POA: Diagnosis not present

## 2018-06-28 DIAGNOSIS — B351 Tinea unguium: Secondary | ICD-10-CM

## 2018-06-28 DIAGNOSIS — I480 Paroxysmal atrial fibrillation: Secondary | ICD-10-CM | POA: Diagnosis not present

## 2018-06-28 DIAGNOSIS — N183 Chronic kidney disease, stage 3 (moderate): Secondary | ICD-10-CM | POA: Diagnosis not present

## 2018-07-05 NOTE — Progress Notes (Signed)
Subjective: Shannon [name] presents the office today for follow-up evaluation calluses to both of her feet as well as for painful, elongated toenails that she cannot trim herself.  She denies any redness or drainage from the callus or toenail sites.  Denies any changes since the last saw her.  She has no other concerns.  Objective: AAO x3, NAD DP/PT pulses palpable bilaterally, CRT less than 3 seconds Hammertoe contractures present to left third toe, right submetatarsal 1, right fifth metatarsal base.  Upon debridement there is no underlying ulceration drainage or any signs of infection. Nails are hypertrophic, dystrophic, discolored and elongated x10.  No surrounding redness or drainage.  Tenderness nails 1-5 bilaterally. Hammertoes present. No pain with calf compression, swelling, warmth, erythema Overall, no change  Assessment: Hammertoe deformity left second toe with pre-ulcerative calluses x3; symptomatic onychomycosis  Plan: -All treatment options discussed with the patient including all alternatives, risks, complications.  -Hyperkeratotic lesions are debrided x3 without any complications or bleeding.  Dispensed further offloading pads to the left second toe. -Nails sharply debrided x10 without any complications or bleeding -Patient encouraged to call the office with any questions, concerns, change in symptoms.   Trula Slade DPM

## 2018-07-23 ENCOUNTER — Encounter: Payer: Self-pay | Admitting: Cardiology

## 2018-07-23 NOTE — Progress Notes (Deleted)
Subjective:  Primary Physician:  Shannon Gravel, MD  @Patient  ID: Shannon [name], female    DOB: 23-Aug-1930, 85 y.o.   MRN: [id number]  No chief complaint on file.   HPI: Shannon [name]  is a 85 y.o. female  with  paroxysmal A. Fib S/P cardioversion 10/2016, difficult to control hypertension (No RAS by angio in 2016), stage III chronic kidney disease, hyperlipidemia, obesity, frequent PVCs and wandering atrial pacemaker by electrocardiogram and history of gout. Nuclear stress in Apr 2018 was non ischemic.  She is here on a 53-month office visit and follow-up of diastolic heart failure and hypertension and paroxysmal atrial fibrillation. She is presently doing well and has been careful with her diet. No bleeding diathesis on Xarelto. She is presently doing well and has no specific complaints.  Past Medical History:  Diagnosis Date  . Acute exacerbation of CHF (congestive heart failure) ([place]) 09/24/2017  . Arthritis    "all over"  . Atrial fibrillation ([place])   . Chronic diastolic CHF (congestive heart failure) ([place]) 09/24/2017  . Chronic kidney disease   . Dyspnea   . Gout   . Hypercholesteremia   . Hypertension   . Mitral regurgitation     Past Surgical History:  Procedure Laterality Date  . BREAST BIOPSY Left 1984  . CARDIOVERSION N/A 10/27/2016   Procedure: CARDIOVERSION;  Surgeon: Adrian Prows, MD;  Location: [facility];  Service: Cardiovascular;  Laterality: N/A;  . CATARACT EXTRACTION, BILATERAL Bilateral 2000's  . RENAL ANGIOGRAM Bilateral 07/31/2014   Procedure: RENAL ANGIOGRAM;  Surgeon: Laverda Page, MD;  Location: [facility] CATH LAB;  Service: Cardiovascular;  Laterality: Bilateral;    Social History   Socioeconomic History  . Marital status: Married    Spouse name: Not on file  . Number of children: Not on file  . Years of education: Not on file  . Highest education level: Not on file  Occupational History  . Not on file  Social Needs  . Financial resource  strain: Not on file  . Food insecurity:    Worry: Not on file    Inability: Not on file  . Transportation needs:    Medical: Not on file    Non-medical: Not on file  Tobacco Use  . Smoking status: Never Smoker  . Smokeless tobacco: Former Systems developer    Types: Snuff  . Tobacco comment: "quit using snuff in the 1960's"  Substance and Sexual Activity  . Alcohol use: No  . Drug use: No  . Sexual activity: Never  Lifestyle  . Physical activity:    Days per week: Not on file    Minutes per session: Not on file  . Stress: Not on file  Relationships  . Social connections:    Talks on phone: Not on file    Gets together: Not on file    Attends religious service: Not on file    Active member of club or organization: Not on file    Attends meetings of clubs or organizations: Not on file    Relationship status: Not on file  . Intimate partner violence:    Fear of current or ex partner: Not on file    Emotionally abused: Not on file    Physically abused: Not on file    Forced sexual activity: Not on file  Other Topics Concern  . Not on file  Social History Narrative  . Not on file    Current Outpatient Medications on File Prior to  Visit  Medication Sig Dispense Refill  . amLODipine (NORVASC) 10 MG tablet Take 1 tablet (10 mg total) by mouth daily. 30 tablet 0  . amLODipine-valsartan (EXFORGE) 5-320 MG tablet Exforge 5 mg-320 mg tablet    . colchicine 0.6 MG tablet Take 0.6 mg by mouth daily.      . dorzolamide-timolol (COSOPT) 22.3-6.8 MG/ML ophthalmic solution dorzolamide 22.3 mg-timolol 6.8 mg/mL eye drops    . febuxostat (ULORIC) 40 MG tablet Take 40 mg by mouth every morning.     . hydrALAZINE (APRESOLINE) 50 MG tablet Take 50 mg by mouth 3 (three) times daily.    . irbesartan (AVAPRO) 150 MG tablet Take 1 tablet (150 mg total) by mouth daily. 30 tablet 0  . LUMIGAN 0.01 % SOLN Place 1 drop into both eyes at bedtime.    . pindolol (VISKEN) 5 MG tablet Take 1 tablet (5 mg total) by  mouth 2 (two) times daily. 60 tablet 0  . Potassium Chloride ER 20 MEQ TBCR TK 1 T PO ONCE D  4  . potassium chloride SA (K-DUR,KLOR-CON) 20 MEQ tablet Take 1 tablet (20 mEq total) by mouth daily. 30 tablet 0  . Rivaroxaban (XARELTO) 15 MG TABS tablet Take 15 mg by mouth daily with supper.    . rosuvastatin (CRESTOR) 20 MG tablet Take 20 mg by mouth every evening.      . torsemide (DEMADEX) 20 MG tablet Take 1 tablet (20 mg total) by mouth 2 (two) times daily at 10 am and 4 pm. 60 tablet 0  . TURMERIC PO Take 1 tablet by mouth daily.     No current facility-administered medications on file prior to visit.      Review of Systems  Constitutional: Negative for malaise/fatigue and weight loss.  Respiratory: Positive for shortness of breath (chronic). Negative for cough and hemoptysis.   Cardiovascular: Positive for leg swelling (chronic). Negative for chest pain, palpitations and claudication.  Gastrointestinal: Negative for abdominal pain, blood in stool, constipation, heartburn and vomiting.  Genitourinary: Negative for dysuria.  Musculoskeletal: Positive for joint pain (bilateral knee). Negative for myalgias.  Neurological: Negative for dizziness, focal weakness and headaches.  Endo/Heme/Allergies: Does not bruise/bleed easily.  Psychiatric/Behavioral: Negative for depression. The patient is not nervous/anxious.   All other systems reviewed and are negative.      Objective:  There were no vitals taken for this visit.  Physical Exam  Constitutional: She appears well-developed. No distress.  Mildly obese  HENT:  Head: Atraumatic.  Eyes: Conjunctivae are normal.  Neck: Neck supple. No JVD present. No thyromegaly present.  Cardiovascular: Normal rate, regular rhythm and normal heart sounds. Exam reveals no gallop.  No murmur heard. Vascular exam: Bilateral 2+ pitting edema.  Femoral pulses and popliteal pulses difficult to feel due to body habitus.  DP febrile left, normal right.  PT  febrile bilateral.  No carotid bruit.  Pulmonary/Chest: Effort normal and breath sounds normal.  Abdominal: Soft. Bowel sounds are normal.  Musculoskeletal: Normal range of motion.  Neurological: She is alert.  Skin: Skin is warm and dry.  Psychiatric: She has a normal mood and affect.    Laboratory Exam: *** CBC Latest Ref Rng & Units 03/11/2018 09/25/2017 09/24/2017  WBC 4.0 - 10.5 K/uL 5.6 6.1 7.7  Hemoglobin 12.0 - 15.0 g/dL 13.5 11.2(L) 13.5  Hematocrit 36.0 - 46.0 % 42.9 35.0(L) 42.6  Platelets 150 - 400 K/uL 190 176 238   CMP Latest Ref Rng & Units 03/11/2018 09/26/2017 09/25/2017  Glucose 70 - 99 mg/dL 133(H) 125(H) -  BUN 8 - 23 mg/dL 25(H) 19 -  Creatinine 0.44 - 1.00 mg/dL 1.43(H) 1.13(H) -  Sodium 135 - 145 mmol/L 139 141 -  Potassium 3.5 - 5.1 mmol/L 4.2 3.8 3.7  Chloride 98 - 111 mmol/L 105 106 -  CO2 22 - 32 mmol/L 25 26 -  Calcium 8.9 - 10.3 mg/dL 9.2 8.8(L) -  Total Protein 6.5 - 8.1 g/dL 7.7 - -  Total Bilirubin 0.3 - 1.2 mg/dL 0.7 - -  Alkaline Phos 38 - 126 U/L 77 - -  AST 15 - 41 U/L 25 - -  ALT 0 - 44 U/L 16 - -   BMP Latest Ref Rng & Units 03/11/2018 09/26/2017 09/25/2017  Glucose 70 - 99 mg/dL 133(H) 125(H) -  BUN 8 - 23 mg/dL 25(H) 19 -  Creatinine 0.44 - 1.00 mg/dL 1.43(H) 1.13(H) -  Sodium 135 - 145 mmol/L 139 141 -  Potassium 3.5 - 5.1 mmol/L 4.2 3.8 3.7  Chloride 98 - 111 mmol/L 105 106 -  CO2 22 - 32 mmol/L 25 26 -  Calcium 8.9 - 10.3 mg/dL 9.2 8.8(L) -    Lipid Panel  No results found for: CHOL, TRIG, HDL, CHOLHDL, VLDL, LDLCALC, LDLDIRECT   BNP (last 3 results) Recent Labs    09/25/17 0335 09/26/17 0319 03/11/18 0354  BNP 397.4* 333.9* 339.3*     HEMOGLOBIN A1C Lab Results  Component Value Date   HGBA1C 5.5 09/24/2017   MPG 111.15 09/24/2017    TSH Recent Labs    09/24/17 0830  TSH 4.464    CARDIAC STUDIES:  Lexiscan myoview stress test 10/12/2016: 1.. The resting electrocardiogram demonstrated atrial fibrillation, normal  resting conduction and nonspecific T changes.  Stress EKG is non-diagnostic for ischemia as it a pharmacologic stress using Lexiscan. Rare PVC. Stress symptoms included dyspnea.  2. Left ventricular cavity is noted to be enlarged on the rest and stress studies with LV end diastolic volume of 086VH.  SPECT images demonstrate homogeneous tracer distribution throughout the myocardium.  No ischemia or scar. The left ventricular ejection fraction was calculated to be 41%.  The ejection fraction may not be accurate and may be related to difficulty in getting EKG due to atrial fibrillation.  This is a low risk study.  Echocardiogram Hospital  09/25/2017:  Low normal LVEF at 84-69%, grade 2 diastolic dysfunction.  Severely dilated left atrium and right atrium, mild RV dilatation with mild RVH.  Moderate pulmonary hypertension, PA pressure 49 mmHg.   Assessment & Recommendations:   1. Paroxysmal atrial fibrillation (HCC) ***  2. Chronic diastolic CHF (congestive heart failure) (HCC) ***  3. Essential hypertension ***  4. CKD (chronic kidney disease) stage 3, GFR 30-59 ml/min (HCC) ***   Recommendation: ***  Adrian Prows, MD, [facility] 07/23/2018, 2:52 PM [facility] [place] Pager: [phone] Office: [phone] If no answer Cell [phone]

## 2018-07-25 ENCOUNTER — Ambulatory Visit: Payer: Self-pay | Admitting: Cardiology

## 2018-08-29 ENCOUNTER — Ambulatory Visit: Payer: Medicare Other | Admitting: Podiatry

## 2018-09-19 ENCOUNTER — Telehealth: Payer: Self-pay

## 2018-09-19 NOTE — Telephone Encounter (Signed)
Pt called and said that her fluid has built back up and doesn't feel the meds are working.

## 2018-09-20 ENCOUNTER — Ambulatory Visit (INDEPENDENT_AMBULATORY_CARE_PROVIDER_SITE_OTHER): Payer: Medicare Other | Admitting: Cardiology

## 2018-09-20 ENCOUNTER — Other Ambulatory Visit: Payer: Self-pay

## 2018-09-20 ENCOUNTER — Other Ambulatory Visit: Payer: Self-pay | Admitting: Cardiology

## 2018-09-20 ENCOUNTER — Encounter: Payer: Self-pay | Admitting: Cardiology

## 2018-09-20 VITALS — BP 164/114 | HR 91 | Ht 67.0 in | Wt 218.0 lb

## 2018-09-20 DIAGNOSIS — R600 Localized edema: Secondary | ICD-10-CM | POA: Diagnosis not present

## 2018-09-20 DIAGNOSIS — I5032 Chronic diastolic (congestive) heart failure: Secondary | ICD-10-CM

## 2018-09-20 DIAGNOSIS — I480 Paroxysmal atrial fibrillation: Secondary | ICD-10-CM | POA: Diagnosis not present

## 2018-09-20 DIAGNOSIS — I10 Essential (primary) hypertension: Secondary | ICD-10-CM | POA: Diagnosis not present

## 2018-09-20 MED ORDER — TORSEMIDE 20 MG PO TABS: 40.0000 mg | ORAL_TABLET | Freq: Two times a day (BID) | ORAL | 0 refills | Status: DC

## 2018-09-20 NOTE — Telephone Encounter (Signed)
Set up virtual visit if possible would like to know more. I am free today probably stack the patient and do not leave gaps

## 2018-09-20 NOTE — Progress Notes (Signed)
Subjective:  Primary Physician:  [name], MD  Patient ID: Shannon [name], female    DOB: 06-29-1930, 85 y.o.   MRN: [id number]  Chief Complaint  Patient presents with  . Shortness of Breath  . Leg Swelling   HPI: Shannon [name]  is a 85 y.o. female  with  paroxysmal A. Fib S/P cardioversion 10/2016, difficult to control hypertension (No RAS by angio in 2016), stage III chronic kidney disease, hyperlipidemia, obesity, frequent PVCs and wandering atrial pacemaker by electrocardiogram and history of gout. Nuclear stress in Apr 2018 was non ischemic.  She called [place] to be seen due to leg swelling, abdominal edema and dyspnea.   She is here on a two-month office visit and follow-up of diastolic heart failure and hypertension and paroxysmal atrial fibrillation. She is presently doing well and has been careful with her diet. No bleeding diathesis on Xarelto. She is presently doing well and has no specific complaints.  Past Medical History:  Diagnosis Date  . Acute exacerbation of CHF (congestive heart failure) ([place]) 09/24/2017  . Arthritis    "all over"  . Atrial fibrillation ([place])   . Chronic diastolic CHF (congestive heart failure) ([place]) 09/24/2017  . Chronic kidney disease   . Dyspnea   . Gout   . Hypercholesteremia   . Hypertension   . Mitral regurgitation     Past Surgical History:  Procedure Laterality Date  . BREAST BIOPSY Left 1984  . CARDIOVERSION N/A 10/27/2016   Procedure: CARDIOVERSION;  Surgeon: Adrian Prows, MD;  Location: [place];  Service: Cardiovascular;  Laterality: N/A;  . CATARACT EXTRACTION, BILATERAL Bilateral 2000's  . RENAL ANGIOGRAM Bilateral 07/31/2014   Procedure: RENAL ANGIOGRAM;  Surgeon: Laverda Page, MD;  Location: [facility] CATH LAB;  Service: Cardiovascular;  Laterality: Bilateral;    Social History   Socioeconomic History  . Marital status: Married    Spouse name: Not on file  . Number of children: 7  . Years of education: Not on  file  . Highest education level: Not on file  Occupational History  . Not on file  Social Needs  . Financial resource strain: Not on file  . Food insecurity:    Worry: Not on file    Inability: Not on file  . Transportation needs:    Medical: Not on file    Non-medical: Not on file  Tobacco Use  . Smoking status: Never Smoker  . Smokeless tobacco: Former Systems developer    Types: Snuff  . Tobacco comment: "quit using snuff in the 1960's"  Substance and Sexual Activity  . Alcohol use: No  . Drug use: No  . Sexual activity: Never  Lifestyle  . Physical activity:    Days per week: Not on file    Minutes per session: Not on file  . Stress: Not on file  Relationships  . Social connections:    Talks on phone: Not on file    Gets together: Not on file    Attends religious service: Not on file    Active member of club or organization: Not on file    Attends meetings of clubs or organizations: Not on file    Relationship status: Not on file  . Intimate partner violence:    Fear of current or ex partner: Not on file    Emotionally abused: Not on file    Physically abused: Not on file    Forced sexual activity: Not on file  Other Topics Concern  .  Not on file  Social History Narrative  . Not on file    Current Outpatient Medications on File Prior to Visit  Medication Sig Dispense Refill  . amLODipine (NORVASC) 10 MG tablet Take 1 tablet (10 mg total) by mouth daily. 30 tablet 0  . colchicine 0.6 MG tablet Take 0.6 mg by mouth daily.      . febuxostat (ULORIC) 40 MG tablet Take 40 mg by mouth every morning.     . hydrALAZINE (APRESOLINE) 50 MG tablet Take 50 mg by mouth 3 (three) times daily.    Marland Kitchen LUMIGAN 0.01 % SOLN Place 1 drop into both eyes at bedtime.    . pindolol (VISKEN) 5 MG tablet Take 1 tablet (5 mg total) by mouth 2 (two) times daily. 60 tablet 0  . potassium chloride SA (K-DUR,KLOR-CON) 20 MEQ tablet Take 1 tablet (20 mEq total) by mouth daily. 30 tablet 0  . Rivaroxaban  (XARELTO) 15 MG TABS tablet Take 15 mg by mouth daily with supper.    . rosuvastatin (CRESTOR) 20 MG tablet Take 20 mg by mouth every evening.       No current facility-administered medications on file prior to visit.    Review of Systems  Constitution: Negative for chills, decreased appetite, malaise/fatigue and weight gain.  Cardiovascular: Positive for dyspnea on exertion and leg swelling. Negative for syncope.  Endocrine: Negative for cold intolerance.  Hematologic/Lymphatic: Does not bruise/bleed easily.  Musculoskeletal: Positive for joint pain. Negative for joint swelling.  Gastrointestinal: Positive for bloating. Negative for abdominal pain, anorexia, change in bowel habit and nausea.  Genitourinary: Negative for dysuria and hematuria.  Neurological: Negative for headaches and light-headedness.  Psychiatric/Behavioral: Negative for depression and substance abuse.  All other systems reviewed and are negative.     Objective:  Blood pressure (!) 164/114, pulse 91, height 5\' 7"  (1.702 m), weight 218 lb (98.9 kg). Body mass index is 34.14 kg/m.  Physical Exam Limited exam: Obese in no acute distress. 2 plus bilateral leg edema. Left lateral leg has eczema and chronic dermatitis change. Superficial varicose veins noted.   Radiology: No results found.  Laboratory Examination:  CMP Latest Ref Rng & Units 03/11/2018 09/26/2017 09/25/2017  Glucose 70 - 99 mg/dL 133(H) 125(H) -  BUN 8 - 23 mg/dL 25(H) 19 -  Creatinine 0.44 - 1.00 mg/dL 1.43(H) 1.13(H) -  Sodium 135 - 145 mmol/L 139 141 -  Potassium 3.5 - 5.1 mmol/L 4.2 3.8 3.7  Chloride 98 - 111 mmol/L 105 106 -  CO2 22 - 32 mmol/L 25 26 -  Calcium 8.9 - 10.3 mg/dL 9.2 8.8(L) -  Total Protein 6.5 - 8.1 g/dL 7.7 - -  Total Bilirubin 0.3 - 1.2 mg/dL 0.7 - -  Alkaline Phos 38 - 126 U/L 77 - -  AST 15 - 41 U/L 25 - -  ALT 0 - 44 U/L 16 - -   CBC Latest Ref Rng & Units 03/11/2018 09/25/2017 09/24/2017  WBC 4.0 - 10.5 K/uL 5.6 6.1 7.7   Hemoglobin 12.0 - 15.0 g/dL 13.5 11.2(L) 13.5  Hematocrit 36.0 - 46.0 % 42.9 35.0(L) 42.6  Platelets 150 - 400 K/uL 190 176 238   Lipid Panel  No results found for: CHOL, TRIG, HDL, CHOLHDL, VLDL, LDLCALC, LDLDIRECT HEMOGLOBIN A1C Lab Results  Component Value Date   HGBA1C 5.5 09/24/2017   MPG 111.15 09/24/2017   TSH Recent Labs    09/24/17 0830  TSH 4.464    CARDIAC STUDIES:  Renal Angio [07/31/2014]: Left mild disease. Right mild to moderate disease. Unable to perform pressure pull back to evaluate significance. Does not appear high grade.   Lexiscan myoview stress test 10/12/2016: 1. The resting electrocardiogram demonstrated atrial fibrillation, normal resting conduction and nonspecific T changes. Stress EKG is non-diagnostic for ischemia as it a pharmacologic stress using Lexiscan. Rare PVC. Stress symptoms included dyspnea. 2. Left ventricular cavity is noted to be enlarged on the rest and stress studies with LV end diastolic volume of 193XT. SPECT images demonstrate homogeneous tracer distribution throughout the myocardium. No ischemia or scar. The left ventricular ejection fraction was calculated to be 41%. The ejection fraction may not be accurate and may be related to difficulty in getting EKG due to atrial fibrillation. This is a low risk study.  Echocardiogram  Hospital 09/25/2017: Low normal LVEF at 02-40%, grade 2 diastolic dysfunction. Severely dilated left atrium and right atrium, mild RV dilatation with mild RVH. Moderate pulmonary hypertension, PA pressure 49 mmHg.  LE segmental pressure 09/27/2017: Right: Resting right ankle-brachial index is within normal range. No evidence of significant right lower extremity arterial disease. The right toe-brachial index is normal. RT great toe pressure = 106 mmHg. Left: Resting left ankle-brachial index is within normal range. No evidence of significant left lower extremity arterial disease. The left toe-brachial index is  normal. LT Great toe pressure = 107 mmHg. Left second toe pressure = 71 mmHg.  Assessment:    Leg edema  Chronic diastolic heart failure (HCC)  Paroxysmal atrial fibrillation (HCC)  Essential hypertension  CHA2DS2-VASc Score is 4.  -(CHF; HTN; vasc disease DM,  Female = 1; Age <65 =0; 65-74 = 1,  >75 =2; stroke = 2).  -(Yearly risk of stroke: Score of 1=1.3; 2=2.2; 3=3.2; 4=4; 5=6.7; 6=9.8; 7=>9.8)  EKG 04/03/2018: Sinus rhythm with first-degree AV block at the rate of 68 bpm, normal axis, anteroseptal infarct old.  Low-voltage complexes.  PVCs (two).  Recommendation:  No bleeding problems on anticoagulation.  Leg edema has worsened in both legs left slightly worse, she also has eczema in her left lateral leg, advised her to use Sween 24 cream once a day, for acute diastolic heart failure, I will recommend Increasing torsemide to 40 mg b.i.d. for 3 days, she will call us back on the 3rd day to see whether she is improved.  If she has not improved, I'll try Zaroxolyn just for 2-3 days only keeping in mind that it can cause acute renal failure and this was discussed extensively with the patient and her daughter.  Present during the video conferencing.  Otherwise blood pressure has been relatively well controlled, she has not had any palpitations, she is tolerating anticoagulation for atrial fibrillation.  If she remains stable, I'll see her back in 3 months for office visit. Do not suspect DVT.  Adrian Prows, MD, [facility] 09/20/2018, 5:50 PM [facility]. [place] Pager: [phone] Office: [phone] If no answer Cell [phone]

## 2018-09-22 MED ORDER — METOLAZONE 5 MG PO TABS: 5.0000 mg | ORAL_TABLET | Freq: Every day | ORAL | 0 refills | Status: DC

## 2018-09-26 ENCOUNTER — Other Ambulatory Visit: Payer: Self-pay

## 2018-09-26 DIAGNOSIS — I480 Paroxysmal atrial fibrillation: Secondary | ICD-10-CM

## 2018-09-26 MED ORDER — METOLAZONE 5 MG PO TABS: 5.0000 mg | ORAL_TABLET | Freq: Every day | ORAL | 0 refills | Status: DC

## 2018-09-26 MED ORDER — TORSEMIDE 20 MG PO TABS: 40.0000 mg | ORAL_TABLET | Freq: Two times a day (BID) | ORAL | 3 refills | Status: DC

## 2018-09-26 NOTE — Telephone Encounter (Signed)
Error msg

## 2018-10-12 ENCOUNTER — Ambulatory Visit: Payer: Self-pay | Admitting: Cardiology

## 2018-10-13 ENCOUNTER — Telehealth: Payer: Self-pay

## 2018-10-13 ENCOUNTER — Other Ambulatory Visit: Payer: Self-pay

## 2018-10-13 DIAGNOSIS — I480 Paroxysmal atrial fibrillation: Secondary | ICD-10-CM

## 2018-10-13 DIAGNOSIS — I5032 Chronic diastolic (congestive) heart failure: Secondary | ICD-10-CM

## 2018-10-13 MED ORDER — METOLAZONE 5 MG PO TABS: 5.0000 mg | ORAL_TABLET | Freq: Every day | ORAL | 0 refills | Status: DC | PRN

## 2018-10-13 MED ORDER — METOLAZONE 5 MG PO TABS: 5.0000 mg | ORAL_TABLET | Freq: Every day | ORAL | 0 refills | Status: DC

## 2018-10-13 NOTE — Telephone Encounter (Signed)
Did she feel better with Metolazone 5 mg tablets daily. She can restart this one tablet daily for 4 days and then PRN  not to exceed 2 tablets a week.  30 pills.  She needs to eat less, watch her diet and keep her legs elevated while resting. Recliner will not do it, but flat surface.

## 2018-10-13 NOTE — Telephone Encounter (Signed)
Pt c/o fluid building back up and she has been taking the torsemide twice a day; What can she do next

## 2018-10-13 NOTE — Addendum Note (Signed)
Addended by: Kela Millin on: 10/13/2018 11:17 PM   Modules accepted: Orders

## 2018-10-13 NOTE — Telephone Encounter (Signed)
Yes she does please send in medication I sent her chart to you for you to sign and send med

## 2018-11-08 ENCOUNTER — Other Ambulatory Visit: Payer: Self-pay | Admitting: Cardiology

## 2018-11-09 DIAGNOSIS — N390 Urinary tract infection, site not specified: Secondary | ICD-10-CM | POA: Diagnosis not present

## 2018-11-09 DIAGNOSIS — I10 Essential (primary) hypertension: Secondary | ICD-10-CM | POA: Diagnosis not present

## 2018-11-09 DIAGNOSIS — R7309 Other abnormal glucose: Secondary | ICD-10-CM | POA: Diagnosis not present

## 2018-11-09 DIAGNOSIS — E7800 Pure hypercholesterolemia, unspecified: Secondary | ICD-10-CM | POA: Diagnosis not present

## 2018-11-10 DIAGNOSIS — H401113 Primary open-angle glaucoma, right eye, severe stage: Secondary | ICD-10-CM | POA: Diagnosis not present

## 2018-11-10 DIAGNOSIS — Z961 Presence of intraocular lens: Secondary | ICD-10-CM | POA: Diagnosis not present

## 2018-11-10 DIAGNOSIS — H401121 Primary open-angle glaucoma, left eye, mild stage: Secondary | ICD-10-CM | POA: Diagnosis not present

## 2018-11-10 DIAGNOSIS — H04123 Dry eye syndrome of bilateral lacrimal glands: Secondary | ICD-10-CM | POA: Diagnosis not present

## 2018-11-16 DIAGNOSIS — E876 Hypokalemia: Secondary | ICD-10-CM | POA: Diagnosis not present

## 2018-11-16 DIAGNOSIS — I480 Paroxysmal atrial fibrillation: Secondary | ICD-10-CM | POA: Diagnosis not present

## 2018-11-16 DIAGNOSIS — I5030 Unspecified diastolic (congestive) heart failure: Secondary | ICD-10-CM | POA: Diagnosis not present

## 2018-11-16 DIAGNOSIS — I351 Nonrheumatic aortic (valve) insufficiency: Secondary | ICD-10-CM | POA: Diagnosis not present

## 2018-11-16 DIAGNOSIS — E118 Type 2 diabetes mellitus with unspecified complications: Secondary | ICD-10-CM | POA: Diagnosis not present

## 2018-11-16 DIAGNOSIS — M109 Gout, unspecified: Secondary | ICD-10-CM | POA: Diagnosis not present

## 2018-11-16 DIAGNOSIS — I340 Nonrheumatic mitral (valve) insufficiency: Secondary | ICD-10-CM | POA: Diagnosis not present

## 2018-11-16 DIAGNOSIS — N390 Urinary tract infection, site not specified: Secondary | ICD-10-CM | POA: Diagnosis not present

## 2018-11-16 DIAGNOSIS — E7800 Pure hypercholesterolemia, unspecified: Secondary | ICD-10-CM | POA: Diagnosis not present

## 2018-11-16 DIAGNOSIS — I10 Essential (primary) hypertension: Secondary | ICD-10-CM | POA: Diagnosis not present

## 2018-11-16 DIAGNOSIS — N184 Chronic kidney disease, stage 4 (severe): Secondary | ICD-10-CM | POA: Diagnosis not present

## 2018-11-16 DIAGNOSIS — I701 Atherosclerosis of renal artery: Secondary | ICD-10-CM | POA: Diagnosis not present

## 2018-11-28 ENCOUNTER — Other Ambulatory Visit: Payer: Self-pay | Admitting: Cardiology

## 2018-12-20 ENCOUNTER — Ambulatory Visit (INDEPENDENT_AMBULATORY_CARE_PROVIDER_SITE_OTHER): Payer: Medicare Other | Admitting: Cardiology

## 2018-12-20 ENCOUNTER — Encounter: Payer: Self-pay | Admitting: Cardiology

## 2018-12-20 ENCOUNTER — Other Ambulatory Visit: Payer: Self-pay

## 2018-12-20 VITALS — BP 121/69 | HR 69 | Ht 67.5 in | Wt 211.0 lb

## 2018-12-20 DIAGNOSIS — I10 Essential (primary) hypertension: Secondary | ICD-10-CM

## 2018-12-20 DIAGNOSIS — I480 Paroxysmal atrial fibrillation: Secondary | ICD-10-CM

## 2018-12-20 DIAGNOSIS — R600 Localized edema: Secondary | ICD-10-CM | POA: Diagnosis not present

## 2018-12-20 NOTE — Progress Notes (Signed)
Virtual Visit via Telephone Note: Patient unable to use video assisted device.  This visit type was conducted due to national recommendations for restrictions regarding the COVID-19 Pandemic (e.g. social distancing).  This format is felt to be most appropriate for this patient at this time.  All issues noted in this document were discussed and addressed.  No physical exam was performed.  The patient has consented to conduct a Telehealth visit and understands insurance will be billed.   I connected with@, on 12/20/18 at  by TELEPHONE and verified that I am speaking with the correct person using two identifiers.   I discussed the limitations of evaluation and management by telemedicine and the availability of in person appointments. The patient expressed understanding and agreed to proceed.   I have discussed with patient regarding the safety during COVID Pandemic and steps and precautions to be taken including social distancing, frequent hand wash and use of detergent soap, gels with the patient. I asked the patient to avoid touching mouth, nose, eyes, ears with the hands. I encouraged regular walking around the neighborhood and exercise and regular diet, as long as social distancing can be maintained.  Subjective:  Primary Physician:  [name], MD  Patient ID: Shannon [name], female    DOB: August 13, 1930, 85 y.o.   MRN: [id number]  Chief Complaint  Patient presents with   Hypertension   Congestive Heart Failure   HPI: Shannon [name]  is a 85 y.o. female  with  paroxysmal A. Fib S/P cardioversion 10/2016, difficult to control hypertension (No RAS by angio in 2016), stage III chronic kidney disease, hyperlipidemia, obesity, frequent PVCs and wandering atrial pacemaker by electrocardiogram and history of gout. Nuclear stress in Apr 2018 was non ischemic.  She is here on a 37-month office visit and follow-up of diastolic heart failure and hypertension and paroxysmal atrial fibrillation.  She is presently doing well and has been careful with her diet. No bleeding diathesis on Xarelto.  Leg edema has improved and she has been taking torsemide regularly but also taking metolazone on a as needed basis.  States that she has been walking on a regular basis and has been regularly losing weight slowly.  No PND or orthopnea.  Past Medical History:  Diagnosis Date   Acute exacerbation of CHF (congestive heart failure) ([place]) 09/24/2017   Arthritis    "all over"   Atrial fibrillation (HCC)    Chronic diastolic CHF (congestive heart failure) ([place]) 09/24/2017   Chronic kidney disease    Dyspnea    Gout    Hypercholesteremia    Hypertension    Mitral regurgitation     Past Surgical History:  Procedure Laterality Date   BREAST BIOPSY Left 1984   CARDIOVERSION N/A 10/27/2016   Procedure: CARDIOVERSION;  Surgeon: Adrian Prows, MD;  Location: [place];  Service: Cardiovascular;  Laterality: N/A;   CATARACT EXTRACTION, BILATERAL Bilateral 2000's   RENAL ANGIOGRAM Bilateral 07/31/2014   Procedure: RENAL ANGIOGRAM;  Surgeon: Laverda Page, MD;  Location: [facility] CATH LAB;  Service: Cardiovascular;  Laterality: Bilateral;    Social History   Socioeconomic History   Marital status: Married    Spouse name: Not on file   Number of children: 7   Years of education: Not on file   Highest education level: Not on file  Occupational History   Not on file  Social Needs   Financial resource strain: Not on file   Food insecurity    Worry: Not on file  Inability: Not on file   Transportation needs    Medical: Not on file    Non-medical: Not on file  Tobacco Use   Smoking status: Never Smoker   Smokeless tobacco: Former Systems developer    Types: Snuff   Tobacco comment: "quit using snuff in the 1960's"  Substance and Sexual Activity   Alcohol use: No   Drug use: No   Sexual activity: Never  Lifestyle   Physical activity    Days per week: Not on file     Minutes per session: Not on file   Stress: Not on file  Relationships   Social connections    Talks on phone: Not on file    Gets together: Not on file    Attends religious service: Not on file    Active member of club or organization: Not on file    Attends meetings of clubs or organizations: Not on file    Relationship status: Not on file   Intimate partner violence    Fear of current or ex partner: Not on file    Emotionally abused: Not on file    Physically abused: Not on file    Forced sexual activity: Not on file  Other Topics Concern   Not on file  Social History Narrative   Not on file    Current Outpatient Medications on File Prior to Visit  Medication Sig Dispense Refill   amLODipine (NORVASC) 10 MG tablet Take 1 tablet (10 mg total) by mouth daily. 30 tablet 0   colchicine 0.6 MG tablet Take 0.6 mg by mouth daily.       febuxostat (ULORIC) 40 MG tablet Take 40 mg by mouth every morning.      hydrALAZINE (APRESOLINE) 50 MG tablet TAKE 1 TABLET BY MOUTH THREE TIMES DAILY 270 tablet 3   LUMIGAN 0.01 % SOLN Place 1 drop into both eyes at bedtime.     metolazone (ZAROXOLYN) 5 MG tablet Take 1 tablet (5 mg total) by mouth daily as needed for up to 30 days (Fluid build up). Take one tablet daily for 4 days then as needed. No more than 2 per week 30 tablet 0   pindolol (VISKEN) 5 MG tablet Take 1 tablet (5 mg total) by mouth 2 (two) times daily. 60 tablet 0   potassium chloride SA (K-DUR,KLOR-CON) 20 MEQ tablet Take 1 tablet (20 mEq total) by mouth daily. 30 tablet 0   rosuvastatin (CRESTOR) 20 MG tablet Take 20 mg by mouth every evening.       torsemide (DEMADEX) 20 MG tablet Take 2 tablets (40 mg total) by mouth 2 (two) times daily at 10 am and 4 pm. Two tablets BID for 3 days only starting 09/19/17 180 tablet 3   XARELTO 15 MG TABS tablet TAKE 1 TABLET BY MOUTH EVERY EVENING AFTER DINNER 90 tablet 0   No current facility-administered medications on file prior to  visit.    Review of Systems  Constitution: Negative for chills, decreased appetite, malaise/fatigue and weight gain.  Cardiovascular: Positive for dyspnea on exertion (stable) and leg swelling (stable). Negative for syncope.  Endocrine: Negative for cold intolerance.  Hematologic/Lymphatic: Does not bruise/bleed easily.  Musculoskeletal: Positive for joint pain. Negative for joint swelling.  Gastrointestinal: Positive for bloating. Negative for abdominal pain, anorexia, change in bowel habit and nausea.  Genitourinary: Negative for dysuria and hematuria.  Neurological: Negative for headaches and light-headedness.  Psychiatric/Behavioral: Negative for depression and substance abuse.  All other systems reviewed  and are negative.     Objective:  Blood pressure 121/69, pulse 69, height 5' 7.5" (1.715 m), weight 211 lb (95.7 kg). Body mass index is 32.56 kg/m. Physical exam not performed or limited due to virtual visit.    Please see exam details from prior visit is as below.  Physical Exam  Constitutional: She appears well-developed and well-nourished. No distress.  HENT:  Head: Atraumatic.  Eyes: Conjunctivae are normal.  Neck: Neck supple. No thyromegaly present.  Cardiovascular: Normal rate, regular rhythm, intact distal pulses and normal pulses. Exam reveals gallop and S4.  No murmur heard. Pulses:      Carotid pulses are 2+ on the right side and 2+ on the left side. 2+ leg edema present. JVD difficult to make out due to short neck.  Femoral and popliteal pulse difficult to feel due to patient's body habitus.  Pulmonary/Chest: Effort normal and breath sounds normal.  Abdominal: Soft. Bowel sounds are normal.  Musculoskeletal: Normal range of motion.  Neurological: She is alert.  Skin: Skin is warm and dry.  Psychiatric: She has a normal mood and affect.   Limited exam: Obese in no acute distress. 2 plus bilateral leg edema. Left lateral leg has eczema and chronic dermatitis  change. Superficial varicose veins noted.   Radiology: No results found.  Laboratory Examination:  CMP Latest Ref Rng & Units 03/11/2018 09/26/2017 09/25/2017  Glucose 70 - 99 mg/dL 133(H) 125(H) -  BUN 8 - 23 mg/dL 25(H) 19 -  Creatinine 0.44 - 1.00 mg/dL 1.43(H) 1.13(H) -  Sodium 135 - 145 mmol/L 139 141 -  Potassium 3.5 - 5.1 mmol/L 4.2 3.8 3.7  Chloride 98 - 111 mmol/L 105 106 -  CO2 22 - 32 mmol/L 25 26 -  Calcium 8.9 - 10.3 mg/dL 9.2 8.8(L) -  Total Protein 6.5 - 8.1 g/dL 7.7 - -  Total Bilirubin 0.3 - 1.2 mg/dL 0.7 - -  Alkaline Phos 38 - 126 U/L 77 - -  AST 15 - 41 U/L 25 - -  ALT 0 - 44 U/L 16 - -   CBC Latest Ref Rng & Units 03/11/2018 09/25/2017 09/24/2017  WBC 4.0 - 10.5 K/uL 5.6 6.1 7.7  Hemoglobin 12.0 - 15.0 g/dL 13.5 11.2(L) 13.5  Hematocrit 36.0 - 46.0 % 42.9 35.0(L) 42.6  Platelets 150 - 400 K/uL 190 176 238   Lipid Panel  No results found for: CHOL, TRIG, HDL, CHOLHDL, VLDL, LDLCALC, LDLDIRECT HEMOGLOBIN A1C Lab Results  Component Value Date   HGBA1C 5.5 09/24/2017   MPG 111.15 09/24/2017   TSH No results for input(s): TSH in the last 8760 hours.  CARDIAC STUDIES:   Renal Angio [07/31/2014]: Left mild disease. Right mild to moderate disease. Unable to perform pressure pull back to evaluate significance. Does not appear high grade.   Lexiscan myoview stress test 10/12/2016: 1. The resting electrocardiogram demonstrated atrial fibrillation, normal resting conduction and nonspecific T changes. Stress EKG is non-diagnostic for ischemia as it a pharmacologic stress using Lexiscan. Rare PVC. Stress symptoms included dyspnea. 2. Left ventricular cavity is noted to be enlarged on the rest and stress studies with LV end diastolic volume of 938BO. SPECT images demonstrate homogeneous tracer distribution throughout the myocardium. No ischemia or scar. The left ventricular ejection fraction was calculated to be 41%. The ejection fraction may not be accurate and may be  related to difficulty in getting EKG due to atrial fibrillation. This is a low risk study.  Echocardiogram  Hospital 09/25/2017: Low  normal LVEF at 76-22%, grade 2 diastolic dysfunction. Severely dilated left atrium and right atrium, mild RV dilatation with mild RVH. Moderate pulmonary hypertension, PA pressure 49 mmHg.  LE segmental pressure 09/27/2017: Right: Resting right ankle-brachial index is within normal range. No evidence of significant right lower extremity arterial disease. The right toe-brachial index is normal. RT great toe pressure = 106 mmHg. Left: Resting left ankle-brachial index is within normal range. No evidence of significant left lower extremity arterial disease. The left toe-brachial index is normal. LT Great toe pressure = 107 mmHg. Left second toe pressure = 71 mmHg.  Assessment:    1. Paroxysmal atrial fibrillation (HCC)   2. Essential hypertension   3. Leg edema   CHA2DS2-VASc Score is 4. Yearly risk of stroke 4   EKG 04/03/2018: Sinus rhythm with first-degree AV block at the rate of 68 bpm, normal axis, anteroseptal infarct old.  Low-voltage complexes.  PVCs (two).  Recommendations:   No bleeding problems on anticoagulation.  She has been losing weight, dyspnea has remained stable.  She has been using metolazone on a as needed basis and approximately once a week.  She is aware of renal toxicity with aggressive diuresis.  Blood pressure is also well controlled and she is trying her best with her dietary changes.  I will see her back in 3 months in the clinic for follow-up of chronic diastolic heart failure, hypertension and PAF.  Adrian Prows, MD, [facility] 12/20/2018, 1:00 PM [place] [facility]. [place] Pager: [phone] Office: [phone] If no answer Cell [phone]

## 2018-12-21 ENCOUNTER — Ambulatory Visit: Payer: PRIVATE HEALTH INSURANCE | Admitting: Cardiology

## 2019-02-09 DIAGNOSIS — M109 Gout, unspecified: Secondary | ICD-10-CM | POA: Diagnosis not present

## 2019-02-09 DIAGNOSIS — E7800 Pure hypercholesterolemia, unspecified: Secondary | ICD-10-CM | POA: Diagnosis not present

## 2019-02-09 DIAGNOSIS — N390 Urinary tract infection, site not specified: Secondary | ICD-10-CM | POA: Diagnosis not present

## 2019-02-09 DIAGNOSIS — R7309 Other abnormal glucose: Secondary | ICD-10-CM | POA: Diagnosis not present

## 2019-02-09 DIAGNOSIS — I10 Essential (primary) hypertension: Secondary | ICD-10-CM | POA: Diagnosis not present

## 2019-02-16 DIAGNOSIS — R7309 Other abnormal glucose: Secondary | ICD-10-CM | POA: Diagnosis not present

## 2019-02-16 DIAGNOSIS — E7800 Pure hypercholesterolemia, unspecified: Secondary | ICD-10-CM | POA: Diagnosis not present

## 2019-02-16 DIAGNOSIS — I10 Essential (primary) hypertension: Secondary | ICD-10-CM | POA: Diagnosis not present

## 2019-02-16 DIAGNOSIS — I480 Paroxysmal atrial fibrillation: Secondary | ICD-10-CM | POA: Diagnosis not present

## 2019-02-16 DIAGNOSIS — Z23 Encounter for immunization: Secondary | ICD-10-CM | POA: Diagnosis not present

## 2019-02-16 DIAGNOSIS — M109 Gout, unspecified: Secondary | ICD-10-CM | POA: Diagnosis not present

## 2019-02-16 DIAGNOSIS — I5030 Unspecified diastolic (congestive) heart failure: Secondary | ICD-10-CM | POA: Diagnosis not present

## 2019-02-21 NOTE — Progress Notes (Signed)
Labs 02/09/2019: Serum glucose 125 mg, BUN 15, creatinine 2.0, eGFR 25/31 mL, potassium 3.3.  CMP otherwise normal.  A1c 5.7%.  HB 14.0/HCT 42.6, platelets 215.  Normal indicis.

## 2019-02-22 ENCOUNTER — Other Ambulatory Visit: Payer: Self-pay | Admitting: Cardiology

## 2019-03-29 ENCOUNTER — Other Ambulatory Visit: Payer: Self-pay

## 2019-03-29 ENCOUNTER — Ambulatory Visit (INDEPENDENT_AMBULATORY_CARE_PROVIDER_SITE_OTHER): Payer: Medicare Other | Admitting: Cardiology

## 2019-03-29 ENCOUNTER — Encounter: Payer: Self-pay | Admitting: Cardiology

## 2019-03-29 VITALS — BP 156/84 | HR 73 | Temp 97.7°F | Ht 67.5 in | Wt 218.7 lb

## 2019-03-29 DIAGNOSIS — I480 Paroxysmal atrial fibrillation: Secondary | ICD-10-CM | POA: Diagnosis not present

## 2019-03-29 DIAGNOSIS — I5032 Chronic diastolic (congestive) heart failure: Secondary | ICD-10-CM | POA: Diagnosis not present

## 2019-03-29 DIAGNOSIS — N1832 Chronic kidney disease, stage 3b: Secondary | ICD-10-CM | POA: Diagnosis not present

## 2019-03-29 DIAGNOSIS — I129 Hypertensive chronic kidney disease with stage 1 through stage 4 chronic kidney disease, or unspecified chronic kidney disease: Secondary | ICD-10-CM

## 2019-03-29 DIAGNOSIS — I10 Essential (primary) hypertension: Secondary | ICD-10-CM

## 2019-03-29 MED ORDER — PINDOLOL 5 MG PO TABS: 5.0000 mg | ORAL_TABLET | Freq: Two times a day (BID) | ORAL | 3 refills | Status: DC

## 2019-03-29 MED ORDER — ISOSORBIDE DINITRATE 30 MG PO TABS: 30.0000 mg | ORAL_TABLET | Freq: Three times a day (TID) | ORAL | 2 refills | Status: DC

## 2019-03-29 NOTE — Progress Notes (Signed)
Subjective:  Primary Physician:  [name], MD  Patient ID: Shannon [name], female    DOB: [birth year], 85 y.o.   MRN: [id number]  Chief Complaint  Patient presents with  . Congestive Heart Failure  . Hypertension  . Edema  . Atrial Fibrillation  . Follow-up   HPI: Shannon [name]  is a 85 y.o. female  with  paroxysmal A. Fib S/P cardioversion 10/2016, difficult to control hypertension (No RAS by angio in 2016), stage III chronic kidney disease, hyperlipidemia, obesity, frequent PVCs and wandering atrial pacemaker by electrocardiogram and history of gout. Nuclear stress in Apr 2018 was non ischemic.  She is here on a 57-monthoffice visit and follow-up of diastolic heart failure and hypertension and paroxysmal atrial fibrillation. She is presently doing well and has been careful with her diet. No bleeding diathesis on Xarelto.  Leg edema resolved after she discontinued Amlodipine. She has been taking torsemide BID and has discontinue metolazone prescribed to be used as needed basis.  States that she has been walking on a regular basis. No PND or orthopnea.  Past Medical History:  Diagnosis Date  . Acute exacerbation of CHF (congestive heart failure) ([place] 09/24/2017  . Arthritis    "all over"  . Atrial fibrillation ([place]   . Chronic diastolic CHF (congestive heart failure) ([facility] 09/24/2017  . Chronic kidney disease   . Dyspnea   . Gout   . Hypercholesteremia   . Hypertension   . Mitral regurgitation     Past Surgical History:  Procedure Laterality Date  . BREAST BIOPSY Left 1984  . CARDIOVERSION N/A 10/27/2016   Procedure: CARDIOVERSION;  Surgeon: GAdrian Prows MD;  [facility]  Service: Cardiovascular;  Laterality: N/A;  . CATARACT EXTRACTION, BILATERAL Bilateral 2000's  . RENAL ANGIOGRAM Bilateral 07/31/2014   Procedure: RENAL ANGIOGRAM;  Surgeon: JLaverda Page MD;  [facility];  Service: Cardiovascular;  Laterality: Bilateral;    Social  History   Socioeconomic History  . Marital status: Married    Spouse name: Not on file  . Number of children: 7  . Years of education: Not on file  . Highest education level: Not on file  Occupational History  . Not on file  Social Needs  . Financial resource strain: Not on file  . Food insecurity    Worry: Not on file    Inability: Not on file  . Transportation needs    Medical: Not on file    Non-medical: Not on file  Tobacco Use  . Smoking status: Never Smoker  . Smokeless tobacco: Former USystems developer   Types: Snuff  . Tobacco comment: "quit using snuff in the 1960's"  Substance and Sexual Activity  . Alcohol use: No  . Drug use: No  . Sexual activity: Never  Lifestyle  . Physical activity    Days per week: Not on file    Minutes per session: Not on file  . Stress: Not on file  Relationships  . Social cHerbaliston phone: Not on file    Gets together: Not on file    Attends religious service: Not on file    Active member of club or organization: Not on file    Attends meetings of clubs or organizations: Not on file    Relationship status: Not on file  . Intimate partner violence    Fear of current or ex partner: Not on file    Emotionally abused: Not on  file    Physically abused: Not on file    Forced sexual activity: Not on file  Other Topics Concern  . Not on file  Social History Narrative  . Not on file    Current Outpatient Medications on File Prior to Visit  Medication Sig Dispense Refill  . colchicine 0.6 MG tablet Take 0.6 mg by mouth daily.      . febuxostat (ULORIC) 40 MG tablet Take 40 mg by mouth every morning.     . hydrALAZINE (APRESOLINE) 50 MG tablet TAKE 1 TABLET BY MOUTH THREE TIMES DAILY 270 tablet 3  . LUMIGAN 0.01 % SOLN Place 1 drop into both eyes at bedtime.    . metolazone (ZAROXOLYN) 5 MG tablet Take 1 tablet (5 mg total) by mouth daily as needed for up to 30 days (Fluid build up). Take one tablet daily for 4 days then as needed.  No more than 2 per week 30 tablet 0  . pindolol (VISKEN) 5 MG tablet Take 1 tablet (5 mg total) by mouth 2 (two) times daily. 60 tablet 0  . potassium chloride SA (K-DUR,KLOR-CON) 20 MEQ tablet Take 1 tablet (20 mEq total) by mouth daily. 30 tablet 0  . rosuvastatin (CRESTOR) 20 MG tablet Take 20 mg by mouth every evening.      . torsemide (DEMADEX) 20 MG tablet Take 2 tablets (40 mg total) by mouth 2 (two) times daily at 10 am and 4 pm. Two tablets BID for 3 days only starting 09/19/17 180 tablet 3  . XARELTO 15 MG TABS tablet TAKE 1 TABLET BY MOUTH EVERY EVENING AFTER DINNER 90 tablet 0   No current facility-administered medications on file prior to visit.    Review of Systems  Constitution: Negative for chills, decreased appetite, malaise/fatigue and weight gain.  Cardiovascular: Positive for dyspnea on exertion (stable). Negative for leg swelling and syncope.  Endocrine: Negative for cold intolerance.  Hematologic/Lymphatic: Does not bruise/bleed easily.  Musculoskeletal: Positive for joint pain. Negative for joint swelling.  Gastrointestinal: Positive for bloating. Negative for abdominal pain, anorexia, change in bowel habit and nausea.  Genitourinary: Negative for dysuria and hematuria.  Neurological: Negative for headaches and light-headedness.  Psychiatric/Behavioral: Negative for depression and substance abuse.  All other systems reviewed and are negative.     Objective:   Vitals with BMI 03/29/2019 12/20/2018 09/20/2018  Height 5' 7.5" 5' 7.5" 5' 7"   Weight 218 lbs 11 oz 211 lbs 218 lbs  BMI 33.73 28.31 51.76  Systolic 160 737 106  Diastolic 84 69 269  Pulse 73 69 91    Physical Exam  Constitutional: She appears well-developed and well-nourished. No distress.  HENT:  Head: Atraumatic.  Eyes: Conjunctivae are normal.  Neck: Neck supple. No thyromegaly present.  Cardiovascular: Normal rate, regular rhythm, intact distal pulses and normal pulses. Exam reveals gallop and S4.   No murmur heard. Pulses:      Carotid pulses are 2+ on the right side and 2+ on the left side. 2+ leg edema present. JVD difficult to make out due to short neck.  Femoral and popliteal pulse difficult to feel due to patient's body habitus.  Pulmonary/Chest: Effort normal and breath sounds normal.  Abdominal: Soft. Bowel sounds are normal.  Musculoskeletal: Normal range of motion.  Neurological: She is alert.  Skin: Skin is warm and dry.  Psychiatric: She has a normal mood and affect.   Limited exam: Obese in no acute distress. 2 plus bilateral leg edema. Left lateral  leg has eczema and chronic dermatitis change. Superficial varicose veins noted.   Radiology: No results found.  Laboratory Examination:  Labs 02/09/2019: Serum glucose 125 mg, BUN 15, creatinine 2.0, eGFR 25/31 mL, potassium 3.3. CMP otherwise normal. A1c 5.7%. HB 14.0/HCT 42.6, platelets 215. Normal indicis.  CMP Latest Ref Rng & Units 03/11/2018 09/26/2017 09/25/2017  Glucose 70 - 99 mg/dL 133(H) 125(H) -  BUN 8 - 23 mg/dL 25(H) 19 -  Creatinine 0.44 - 1.00 mg/dL 1.43(H) 1.13(H) -  Sodium 135 - 145 mmol/L 139 141 -  Potassium 3.5 - 5.1 mmol/L 4.2 3.8 3.7  Chloride 98 - 111 mmol/L 105 106 -  CO2 22 - 32 mmol/L 25 26 -  Calcium 8.9 - 10.3 mg/dL 9.2 8.8(L) -  Total Protein 6.5 - 8.1 g/dL 7.7 - -  Total Bilirubin 0.3 - 1.2 mg/dL 0.7 - -  Alkaline Phos 38 - 126 U/L 77 - -  AST 15 - 41 U/L 25 - -  ALT 0 - 44 U/L 16 - -   CBC Latest Ref Rng & Units 03/11/2018 09/25/2017 09/24/2017  WBC 4.0 - 10.5 K/uL 5.6 6.1 7.7  Hemoglobin 12.0 - 15.0 g/dL 13.5 11.2(L) 13.5  Hematocrit 36.0 - 46.0 % 42.9 35.0(L) 42.6  Platelets 150 - 400 K/uL 190 176 238   Lipid Panel  No results found for: CHOL, TRIG, HDL, CHOLHDL, VLDL, LDLCALC, LDLDIRECT HEMOGLOBIN A1C Lab Results  Component Value Date   HGBA1C 5.5 09/24/2017   MPG 111.15 09/24/2017   TSH No results for input(s): TSH in the last 8760 hours.  CARDIAC STUDIES:   Renal  Angio [07/31/2014]: Left mild disease. Right mild to moderate disease. Unable to perform pressure pull back to evaluate significance. Does not appear high grade.   Lexiscan myoview stress test 10/12/2016: 1. The resting electrocardiogram demonstrated atrial fibrillation, normal resting conduction and nonspecific T changes. Stress EKG is non-diagnostic for ischemia as it a pharmacologic stress using Lexiscan. Rare PVC. Stress symptoms included dyspnea. 2. Left ventricular cavity is noted to be enlarged on the rest and stress studies with LV end diastolic volume of 160VP. SPECT images demonstrate homogeneous tracer distribution throughout the myocardium. No ischemia or scar. The left ventricular ejection fraction was calculated to be 41%. The ejection fraction may not be accurate and may be related to difficulty in getting EKG due to atrial fibrillation. This is a low risk study.  Echocardiogram  Hospital 09/25/2017: Low normal LVEF at 71-06%, grade 2 diastolic dysfunction. Severely dilated left atrium and right atrium, mild RV dilatation with mild RVH. Moderate pulmonary hypertension, PA pressure 49 mmHg.  LE segmental pressure 09/27/2017: Right: Resting right ankle-brachial index is within normal range. No evidence of significant right lower extremity arterial disease. The right toe-brachial index is normal. RT great toe pressure = 106 mmHg. Left: Resting left ankle-brachial index is within normal range. No evidence of significant left lower extremity arterial disease. The left toe-brachial index is normal. LT Great toe pressure = 107 mmHg. Left second toe pressure = 71 mmHg.  Assessment:    1. Paroxysmal atrial fibrillation (HCC)   2. Essential hypertension   3. Chronic diastolic heart failure (HCC)   4. Stage 3b chronic kidney disease   CHA2DS2-VASc Score is 4. Yearly risk of stroke 4   EKG 03/29/2019: Atrial fibrillation controlled Ventricular response at the rate of 74 bpm, normal axis.   Anteroseptal infarct old.  Normal QT interval.  No evidence of ischemia.   EKG 04/03/2018: Sinus  rhythm with first-degree AV block at the rate of 68 bpm, normal axis, anteroseptal infarct old.  Low-voltage complexes.  PVCs (two).  Recommendations:   No bleeding problems on anticoagulation. Dyspnea has remained stable. There is no clinical evidence of congestive heart failure.  She is now off of metolazone and presently on torsemide 20 mg t.i.d. she has stage III B chronic kidney disease and gradually worsening of renal function.  Weight loss was discussed with the patient.  She could not tolerate amlodipine due to leg edema, I have started him on isosorbide dinitrate 30 mg t.i.d. along with hydralazine.  I'll see her back in 6 months or sooner if problems.  I reviewed her labs from her PCP. She is now 85 years of age. We will need to do ACP - (Advanced Care Planning) at some point soon.    Adrian Prows, MD, [facility] 03/29/2019, 3:33 PM [facility]. [place] Pager: [phone] Office: [phone] If no answer Cell [phone]

## 2019-04-24 ENCOUNTER — Other Ambulatory Visit: Payer: Self-pay | Admitting: Internal Medicine

## 2019-04-24 DIAGNOSIS — Z1231 Encounter for screening mammogram for malignant neoplasm of breast: Secondary | ICD-10-CM

## 2019-05-10 DIAGNOSIS — I10 Essential (primary) hypertension: Secondary | ICD-10-CM | POA: Diagnosis not present

## 2019-05-10 DIAGNOSIS — R7309 Other abnormal glucose: Secondary | ICD-10-CM | POA: Diagnosis not present

## 2019-05-15 DIAGNOSIS — E7800 Pure hypercholesterolemia, unspecified: Secondary | ICD-10-CM | POA: Diagnosis not present

## 2019-05-15 DIAGNOSIS — I10 Essential (primary) hypertension: Secondary | ICD-10-CM | POA: Diagnosis not present

## 2019-05-16 DIAGNOSIS — H04123 Dry eye syndrome of bilateral lacrimal glands: Secondary | ICD-10-CM | POA: Diagnosis not present

## 2019-05-16 DIAGNOSIS — Z961 Presence of intraocular lens: Secondary | ICD-10-CM | POA: Diagnosis not present

## 2019-05-16 DIAGNOSIS — H401121 Primary open-angle glaucoma, left eye, mild stage: Secondary | ICD-10-CM | POA: Diagnosis not present

## 2019-05-16 DIAGNOSIS — H401113 Primary open-angle glaucoma, right eye, severe stage: Secondary | ICD-10-CM | POA: Diagnosis not present

## 2019-06-02 ENCOUNTER — Other Ambulatory Visit: Payer: Self-pay

## 2019-06-02 DIAGNOSIS — I482 Chronic atrial fibrillation, unspecified: Secondary | ICD-10-CM

## 2019-06-02 MED ORDER — RIVAROXABAN 15 MG PO TABS: ORAL_TABLET | ORAL | 0 refills | Status: DC

## 2019-06-15 ENCOUNTER — Other Ambulatory Visit: Payer: Self-pay

## 2019-06-15 ENCOUNTER — Ambulatory Visit
Admission: RE | Admit: 2019-06-15 | Discharge: 2019-06-15 | Disposition: A | Discharge status: Home / Self Care | Payer: Medicare Other | Source: Ambulatory Visit | Attending: Internal Medicine | Admitting: Internal Medicine

## 2019-06-15 DIAGNOSIS — Z1231 Encounter for screening mammogram for malignant neoplasm of breast: Secondary | ICD-10-CM

## 2019-06-21 ENCOUNTER — Other Ambulatory Visit: Payer: Self-pay | Admitting: Cardiology

## 2019-08-08 DIAGNOSIS — M109 Gout, unspecified: Secondary | ICD-10-CM | POA: Diagnosis not present

## 2019-08-08 DIAGNOSIS — I10 Essential (primary) hypertension: Secondary | ICD-10-CM | POA: Diagnosis not present

## 2019-08-28 ENCOUNTER — Other Ambulatory Visit: Payer: Self-pay | Admitting: Cardiology

## 2019-08-28 DIAGNOSIS — I482 Chronic atrial fibrillation, unspecified: Secondary | ICD-10-CM

## 2019-09-19 ENCOUNTER — Other Ambulatory Visit: Payer: Self-pay | Admitting: Cardiology

## 2019-09-27 ENCOUNTER — Ambulatory Visit: Payer: PRIVATE HEALTH INSURANCE | Admitting: Cardiology

## 2019-09-27 NOTE — Progress Notes (Deleted)
Subjective:  Primary Physician:  [name], MD  Patient ID: Shannon [name], female    DOB: July 21, 1930, 86 y.o.   MRN: [id number]  No chief complaint on file.  HPI: Shannon [name]  is a 86 y.o. female  with  paroxysmal A. Fib S/P cardioversion 10/2016, difficult to control hypertension (No RAS by angio in 2016), stage III chronic kidney disease, hyperlipidemia, obesity, frequent PVCs and wandering atrial pacemaker by electrocardiogram and history of gout. Nuclear stress in Apr 2018 was non ischemic.  She is here on a 56-monthoffice visit and follow-up of diastolic heart failure and hypertension and paroxysmal atrial fibrillation. She is presently doing well and has been careful with her diet. No bleeding diathesis on Xarelto.  Leg edema resolved after she discontinued Amlodipine. She has been taking torsemide BID and has discontinue metolazone prescribed to be used as needed basis.  States that she has been walking on a regular basis. No PND or orthopnea.  Past Medical History:  Diagnosis Date  . Acute exacerbation of CHF (congestive heart failure) ([place] 09/24/2017  . Arthritis    "all over"  . Atrial fibrillation ([facility]   . Chronic diastolic CHF (congestive heart failure) ([place] 09/24/2017  . Chronic kidney disease   . Dyspnea   . Gout   . Hypercholesteremia   . Hypertension   . Mitral regurgitation     Past Surgical History:  Procedure Laterality Date  . BREAST BIOPSY Left 1984  . CARDIOVERSION N/A 10/27/2016   Procedure: CARDIOVERSION;  Surgeon: GAdrian Prows MD;  [place] [facility]  Service: Cardiovascular;  Laterality: N/A;  . CATARACT EXTRACTION, BILATERAL Bilateral 2000's  . RENAL ANGIOGRAM Bilateral 07/31/2014   Procedure: RENAL ANGIOGRAM;  Surgeon: JLaverda Page MD;  [facility];  Service: Cardiovascular;  Laterality: Bilateral;    Social History   Socioeconomic History  . Marital status: Married    Spouse name: Not on file  . Number of  children: 7  . Years of education: Not on file  . Highest education level: Not on file  Occupational History  . Not on file  Tobacco Use  . Smoking status: Never Smoker  . Smokeless tobacco: Former USystems developer   Types: Snuff  . Tobacco comment: "quit using snuff in the 1960's"  Substance and Sexual Activity  . Alcohol use: No  . Drug use: No  . Sexual activity: Never  Other Topics Concern  . Not on file  Social History Narrative  . Not on file   Social Determinants of Health   Financial Resource Strain:   . Difficulty of Paying Living Expenses:   Food Insecurity:   . Worried About RCharity fundraiserin the Last Year:   . RArboriculturistin the Last Year:   Transportation Needs:   . LFilm/video editor(Medical):   .Marland KitchenLack of Transportation (Non-Medical):   Physical Activity:   . Days of Exercise per Week:   . Minutes of Exercise per Session:   Stress:   . Feeling of Stress :   Social Connections:   . Frequency of Communication with Friends and Family:   . Frequency of Social Gatherings with Friends and Family:   . Attends Religious Services:   . Active Member of Clubs or Organizations:   . Attends CArchivistMeetings:   .Marland KitchenMarital Status:   Intimate Partner Violence:   . Fear of Current or Ex-Partner:   . Emotionally Abused:   .  Physically Abused:   . Sexually Abused:     Current Outpatient Medications on File Prior to Visit  Medication Sig Dispense Refill  . colchicine 0.6 MG tablet Take 0.6 mg by mouth daily.      . febuxostat (ULORIC) 40 MG tablet Take 40 mg by mouth every morning.     . hydrALAZINE (APRESOLINE) 50 MG tablet TAKE 1 TABLET BY MOUTH THREE TIMES DAILY 270 tablet 3  . isosorbide dinitrate (ISORDIL) 30 MG tablet TAKE 1 TABLET(30 MG) BY MOUTH THREE TIMES DAILY 90 tablet 2  . LUMIGAN 0.01 % SOLN Place 1 drop into both eyes at bedtime.    . metolazone (ZAROXOLYN) 5 MG tablet Take 1 tablet (5 mg total) by mouth daily as needed for up to 30 days  (Fluid build up). Take one tablet daily for 4 days then as needed. No more than 2 per week (Patient not taking: Reported on 03/29/2019) 30 tablet 0  . pindolol (VISKEN) 5 MG tablet Take 1 tablet (5 mg total) by mouth 2 (two) times daily. 180 tablet 3  . potassium chloride SA (K-DUR,KLOR-CON) 20 MEQ tablet Take 1 tablet (20 mEq total) by mouth daily. 30 tablet 0  . rosuvastatin (CRESTOR) 20 MG tablet Take 20 mg by mouth every evening.      . torsemide (DEMADEX) 20 MG tablet Take 2 tablets (40 mg total) by mouth 2 (two) times daily at 10 am and 4 pm. Two tablets BID for 3 days only starting 09/19/17 180 tablet 3  . XARELTO 15 MG TABS tablet TAKE 1 TABLET BY MOUTH EVERY EVENING AFTER DINNER 90 tablet 0   No current facility-administered medications on file prior to visit.   Review of Systems  Cardiovascular: Positive for dyspnea on exertion. Negative for chest pain and leg swelling.  Musculoskeletal: Positive for arthritis.  Gastrointestinal: Positive for bloating. Negative for melena.      Objective:   Vitals with BMI 03/29/2019 12/20/2018 09/20/2018  Height 5' 7.5" 5' 7.5" 5' 7"   Weight 218 lbs 11 oz 211 lbs 218 lbs  BMI 33.73 10.27 25.36  Systolic 644 034 742  Diastolic 84 69 595  Pulse 73 69 91    Physical Exam  Constitutional: She appears well-developed and well-nourished. No distress.  Cardiovascular: Normal rate, regular rhythm, intact distal pulses and normal pulses. Exam reveals gallop and S4.  No murmur heard. Pulses:      Carotid pulses are 2+ on the right side and 2+ on the left side. 2+ leg edema present. JVD difficult to make out due to short neck.  Femoral and popliteal pulse difficult to feel due to patient's body habitus.  Pulmonary/Chest: Effort normal and breath sounds normal.  Abdominal: Soft. Bowel sounds are normal.  Psychiatric: She has a normal mood and affect.   Limited exam: Obese in no acute distress. 2 plus bilateral leg edema. Left lateral leg has eczema and  chronic dermatitis change. Superficial varicose veins noted.   Radiology: No results found.  Laboratory Examination:  Labs 02/09/2019: Serum glucose 125 mg, BUN 15, creatinine 2.0, eGFR 25/31 mL, potassium 3.3. CMP otherwise normal. A1c 5.7%. HB 14.0/HCT 42.6, platelets 215. Normal indicis.  CMP Latest Ref Rng & Units 03/11/2018 09/26/2017 09/25/2017  Glucose 70 - 99 mg/dL 133(H) 125(H) -  BUN 8 - 23 mg/dL 25(H) 19 -  Creatinine 0.44 - 1.00 mg/dL 1.43(H) 1.13(H) -  Sodium 135 - 145 mmol/L 139 141 -  Potassium 3.5 - 5.1 mmol/L 4.2 3.8 3.7  Chloride 98 - 111 mmol/L 105 106 -  CO2 22 - 32 mmol/L 25 26 -  Calcium 8.9 - 10.3 mg/dL 9.2 8.8(L) -  Total Protein 6.5 - 8.1 g/dL 7.7 - -  Total Bilirubin 0.3 - 1.2 mg/dL 0.7 - -  Alkaline Phos 38 - 126 U/L 77 - -  AST 15 - 41 U/L 25 - -  ALT 0 - 44 U/L 16 - -   CBC Latest Ref Rng & Units 03/11/2018 09/25/2017 09/24/2017  WBC 4.0 - 10.5 K/uL 5.6 6.1 7.7  Hemoglobin 12.0 - 15.0 g/dL 13.5 11.2(L) 13.5  Hematocrit 36.0 - 46.0 % 42.9 35.0(L) 42.6  Platelets 150 - 400 K/uL 190 176 238   Lipid Panel  No results found for: CHOL, TRIG, HDL, CHOLHDL, VLDL, LDLCALC, LDLDIRECT HEMOGLOBIN A1C Lab Results  Component Value Date   HGBA1C 5.5 09/24/2017   MPG 111.15 09/24/2017   TSH No results for input(s): TSH in the last 8760 hours.  CARDIAC STUDIES:   Renal Angio [07/31/2014]: Left mild disease. Right mild to moderate disease. Unable to perform pressure pull back to evaluate significance. Does not appear high grade.   Lexiscan myoview stress test 10/12/2016: 1. The resting electrocardiogram demonstrated atrial fibrillation, normal resting conduction and nonspecific T changes. Stress EKG is non-diagnostic for ischemia as it a pharmacologic stress using Lexiscan. Rare PVC. Stress symptoms included dyspnea. 2. Left ventricular cavity is noted to be enlarged on the rest and stress studies with LV end diastolic volume of 762GB. SPECT images demonstrate  homogeneous tracer distribution throughout the myocardium. No ischemia or scar. The left ventricular ejection fraction was calculated to be 41%. The ejection fraction may not be accurate and may be related to difficulty in getting EKG due to atrial fibrillation. This is a low risk study.  Echocardiogram  Hospital 09/25/2017: Low normal LVEF at 15-17%, grade 2 diastolic dysfunction. Severely dilated left atrium and right atrium, mild RV dilatation with mild RVH. Moderate pulmonary hypertension, PA pressure 49 mmHg.  LE segmental pressure 09/27/2017: Right: Resting right ankle-brachial index is within normal range. No evidence of significant right lower extremity arterial disease. The right toe-brachial index is normal. RT great toe pressure = 106 mmHg. Left: Resting left ankle-brachial index is within normal range. No evidence of significant left lower extremity arterial disease. The left toe-brachial index is normal. LT Great toe pressure = 107 mmHg. Left second toe pressure = 71 mmHg.  Assessment:    1. Paroxysmal atrial fibrillation ([place]). CHA2DS2-VASc Score is 4. Yearly risk of stroke 4    2. Chronic diastolic heart failure ([place])   3. Essential hypertension     EKG 03/29/2019: Atrial fibrillation controlled Ventricular response at the rate of 74 bpm, normal axis.  Anteroseptal infarct old.  Normal QT interval.  No evidence of ischemia.   EKG 04/03/2018: Sinus rhythm with first-degree AV block at the rate of 68 bpm, normal axis, anteroseptal infarct old.  Low-voltage complexes.  PVCs (two).  Recommendations:   Shannon [name]  is a 86 y.o. female  with  paroxysmal A. Fib S/P cardioversion 10/2016, difficult to control hypertension (No RAS by angio in 2016), stage III chronic kidney disease, hyperlipidemia, obesity, frequent PVCs and wandering atrial pacemaker by electrocardiogram and history of gout. Nuclear stress in Apr 2018 was non ischemic.  No bleeding problems on anticoagulation.  Dyspnea has remained stable. There is no clinical evidence of congestive heart failure.  She is now off of metolazone and presently on torsemide  20 mg t.i.d. she has stage III B chronic kidney disease and gradually worsening of renal function.  Weight loss was discussed with the patient.  She could not tolerate amlodipine due to leg edema, I have started him on isosorbide dinitrate 30 mg t.i.d. along with hydralazine.  I'll see her back in 6 months or sooner if problems.  I reviewed her labs from her PCP. She is now 86 years of age. We will need to do ACP - (Advanced Care Planning) at some point soon.    Adrian Prows, MD, [facility] 09/27/2019, 1:42 PM [facility]. [place] Pager: [phone] Office: [phone] If no answer Cell [phone]

## 2019-11-07 ENCOUNTER — Ambulatory Visit: Payer: Medicare Other | Admitting: Gynecologic Oncology

## 2019-11-20 DIAGNOSIS — H401113 Primary open-angle glaucoma, right eye, severe stage: Secondary | ICD-10-CM | POA: Diagnosis not present

## 2019-11-20 DIAGNOSIS — H04123 Dry eye syndrome of bilateral lacrimal glands: Secondary | ICD-10-CM | POA: Diagnosis not present

## 2019-11-20 DIAGNOSIS — Z961 Presence of intraocular lens: Secondary | ICD-10-CM | POA: Diagnosis not present

## 2019-11-20 DIAGNOSIS — H401121 Primary open-angle glaucoma, left eye, mild stage: Secondary | ICD-10-CM | POA: Diagnosis not present

## 2019-12-01 ENCOUNTER — Other Ambulatory Visit: Payer: Self-pay | Admitting: Cardiology

## 2019-12-01 DIAGNOSIS — I482 Chronic atrial fibrillation, unspecified: Secondary | ICD-10-CM

## 2019-12-22 ENCOUNTER — Other Ambulatory Visit: Payer: Self-pay | Admitting: Cardiology

## 2019-12-28 DIAGNOSIS — I5030 Unspecified diastolic (congestive) heart failure: Secondary | ICD-10-CM | POA: Diagnosis not present

## 2019-12-28 DIAGNOSIS — E7800 Pure hypercholesterolemia, unspecified: Secondary | ICD-10-CM | POA: Diagnosis not present

## 2019-12-28 DIAGNOSIS — N184 Chronic kidney disease, stage 4 (severe): Secondary | ICD-10-CM | POA: Diagnosis not present

## 2019-12-28 DIAGNOSIS — M109 Gout, unspecified: Secondary | ICD-10-CM | POA: Diagnosis not present

## 2019-12-28 DIAGNOSIS — I10 Essential (primary) hypertension: Secondary | ICD-10-CM | POA: Diagnosis not present

## 2019-12-28 DIAGNOSIS — I480 Paroxysmal atrial fibrillation: Secondary | ICD-10-CM | POA: Diagnosis not present

## 2019-12-28 DIAGNOSIS — I351 Nonrheumatic aortic (valve) insufficiency: Secondary | ICD-10-CM | POA: Diagnosis not present

## 2019-12-28 DIAGNOSIS — I340 Nonrheumatic mitral (valve) insufficiency: Secondary | ICD-10-CM | POA: Diagnosis not present

## 2019-12-28 DIAGNOSIS — I701 Atherosclerosis of renal artery: Secondary | ICD-10-CM | POA: Diagnosis not present

## 2019-12-28 DIAGNOSIS — E118 Type 2 diabetes mellitus with unspecified complications: Secondary | ICD-10-CM | POA: Diagnosis not present

## 2020-02-01 ENCOUNTER — Other Ambulatory Visit: Payer: Self-pay | Admitting: Cardiology

## 2020-02-01 DIAGNOSIS — I482 Chronic atrial fibrillation, unspecified: Secondary | ICD-10-CM

## 2020-03-24 ENCOUNTER — Other Ambulatory Visit: Payer: Self-pay | Admitting: Cardiology

## 2020-03-24 DIAGNOSIS — I10 Essential (primary) hypertension: Secondary | ICD-10-CM

## 2020-03-25 ENCOUNTER — Other Ambulatory Visit: Payer: Self-pay | Admitting: Cardiology

## 2020-03-25 DIAGNOSIS — I10 Essential (primary) hypertension: Secondary | ICD-10-CM

## 2020-04-08 DIAGNOSIS — E7800 Pure hypercholesterolemia, unspecified: Secondary | ICD-10-CM | POA: Diagnosis not present

## 2020-04-08 DIAGNOSIS — N390 Urinary tract infection, site not specified: Secondary | ICD-10-CM | POA: Diagnosis not present

## 2020-04-08 DIAGNOSIS — E789 Disorder of lipoprotein metabolism, unspecified: Secondary | ICD-10-CM | POA: Diagnosis not present

## 2020-04-08 DIAGNOSIS — E876 Hypokalemia: Secondary | ICD-10-CM | POA: Diagnosis not present

## 2020-04-08 DIAGNOSIS — I10 Essential (primary) hypertension: Secondary | ICD-10-CM | POA: Diagnosis not present

## 2020-04-08 DIAGNOSIS — E118 Type 2 diabetes mellitus with unspecified complications: Secondary | ICD-10-CM | POA: Diagnosis not present

## 2020-04-30 DIAGNOSIS — I5032 Chronic diastolic (congestive) heart failure: Secondary | ICD-10-CM | POA: Diagnosis not present

## 2020-04-30 DIAGNOSIS — R7309 Other abnormal glucose: Secondary | ICD-10-CM | POA: Diagnosis not present

## 2020-04-30 DIAGNOSIS — Z23 Encounter for immunization: Secondary | ICD-10-CM | POA: Diagnosis not present

## 2020-04-30 DIAGNOSIS — M109 Gout, unspecified: Secondary | ICD-10-CM | POA: Diagnosis not present

## 2020-04-30 DIAGNOSIS — Z0000 Encounter for general adult medical examination without abnormal findings: Secondary | ICD-10-CM | POA: Diagnosis not present

## 2020-04-30 DIAGNOSIS — E7800 Pure hypercholesterolemia, unspecified: Secondary | ICD-10-CM | POA: Diagnosis not present

## 2020-04-30 DIAGNOSIS — I10 Essential (primary) hypertension: Secondary | ICD-10-CM | POA: Diagnosis not present

## 2020-04-30 DIAGNOSIS — N183 Chronic kidney disease, stage 3 unspecified: Secondary | ICD-10-CM | POA: Diagnosis not present

## 2020-04-30 DIAGNOSIS — I480 Paroxysmal atrial fibrillation: Secondary | ICD-10-CM | POA: Diagnosis not present

## 2020-07-07 ENCOUNTER — Emergency Department (HOSPITAL_COMMUNITY): Payer: Medicare Other

## 2020-07-07 ENCOUNTER — Inpatient Hospital Stay (HOSPITAL_COMMUNITY)
Admission: EM | Admit: 2020-07-07 | Discharge: 2020-07-11 | DRG: 177 | Disposition: A | Discharge status: Home Health | Payer: Medicare Other | Attending: Internal Medicine | Admitting: Internal Medicine

## 2020-07-07 ENCOUNTER — Other Ambulatory Visit: Payer: Self-pay

## 2020-07-07 DIAGNOSIS — F17220 Nicotine dependence, chewing tobacco, uncomplicated: Secondary | ICD-10-CM | POA: Diagnosis present

## 2020-07-07 DIAGNOSIS — J1282 Pneumonia due to coronavirus disease 2019: Secondary | ICD-10-CM | POA: Diagnosis not present

## 2020-07-07 DIAGNOSIS — R0602 Shortness of breath: Secondary | ICD-10-CM | POA: Diagnosis not present

## 2020-07-07 DIAGNOSIS — I130 Hypertensive heart and chronic kidney disease with heart failure and stage 1 through stage 4 chronic kidney disease, or unspecified chronic kidney disease: Secondary | ICD-10-CM | POA: Diagnosis present

## 2020-07-07 DIAGNOSIS — I5032 Chronic diastolic (congestive) heart failure: Secondary | ICD-10-CM | POA: Diagnosis not present

## 2020-07-07 DIAGNOSIS — J9601 Acute respiratory failure with hypoxia: Secondary | ICD-10-CM | POA: Diagnosis present

## 2020-07-07 DIAGNOSIS — I480 Paroxysmal atrial fibrillation: Secondary | ICD-10-CM | POA: Diagnosis present

## 2020-07-07 DIAGNOSIS — I248 Other forms of acute ischemic heart disease: Secondary | ICD-10-CM | POA: Diagnosis present

## 2020-07-07 DIAGNOSIS — Z79899 Other long term (current) drug therapy: Secondary | ICD-10-CM

## 2020-07-07 DIAGNOSIS — N1832 Chronic kidney disease, stage 3b: Secondary | ICD-10-CM | POA: Diagnosis present

## 2020-07-07 DIAGNOSIS — I10 Essential (primary) hypertension: Secondary | ICD-10-CM

## 2020-07-07 DIAGNOSIS — R778 Other specified abnormalities of plasma proteins: Secondary | ICD-10-CM | POA: Diagnosis not present

## 2020-07-07 DIAGNOSIS — Z9981 Dependence on supplemental oxygen: Secondary | ICD-10-CM | POA: Diagnosis not present

## 2020-07-07 DIAGNOSIS — Z6833 Body mass index (BMI) 33.0-33.9, adult: Secondary | ICD-10-CM | POA: Diagnosis not present

## 2020-07-07 DIAGNOSIS — E669 Obesity, unspecified: Secondary | ICD-10-CM | POA: Diagnosis present

## 2020-07-07 DIAGNOSIS — N183 Chronic kidney disease, stage 3 unspecified: Secondary | ICD-10-CM | POA: Diagnosis present

## 2020-07-07 DIAGNOSIS — M7989 Other specified soft tissue disorders: Secondary | ICD-10-CM | POA: Diagnosis not present

## 2020-07-07 DIAGNOSIS — U071 COVID-19: Secondary | ICD-10-CM | POA: Diagnosis not present

## 2020-07-07 DIAGNOSIS — M109 Gout, unspecified: Secondary | ICD-10-CM | POA: Diagnosis present

## 2020-07-07 DIAGNOSIS — E7800 Pure hypercholesterolemia, unspecified: Secondary | ICD-10-CM | POA: Diagnosis present

## 2020-07-07 DIAGNOSIS — R7989 Other specified abnormal findings of blood chemistry: Secondary | ICD-10-CM | POA: Diagnosis not present

## 2020-07-07 DIAGNOSIS — E785 Hyperlipidemia, unspecified: Secondary | ICD-10-CM | POA: Diagnosis present

## 2020-07-07 DIAGNOSIS — Z7901 Long term (current) use of anticoagulants: Secondary | ICD-10-CM | POA: Diagnosis not present

## 2020-07-07 DIAGNOSIS — N179 Acute kidney failure, unspecified: Secondary | ICD-10-CM | POA: Diagnosis present

## 2020-07-07 DIAGNOSIS — J189 Pneumonia, unspecified organism: Secondary | ICD-10-CM | POA: Diagnosis not present

## 2020-07-07 LAB — FERRITIN
Ferritin: 131 ng/mL (ref 11–307)
Ferritin: 134 ng/mL (ref 11–307)

## 2020-07-07 LAB — LACTATE DEHYDROGENASE
LDH: 242 U/L — ABNORMAL HIGH (ref 98–192)
LDH: 245 U/L — ABNORMAL HIGH (ref 98–192)

## 2020-07-07 LAB — CBC WITH DIFFERENTIAL/PLATELET
Abs Immature Granulocytes: 0.07 10*3/uL (ref 0.00–0.07)
Basophils Absolute: 0 10*3/uL (ref 0.0–0.1)
Basophils Relative: 1 %
Eosinophils Absolute: 0.2 10*3/uL (ref 0.0–0.5)
Eosinophils Relative: 3 %
HCT: 42.2 % (ref 36.0–46.0)
Hemoglobin: 13.5 g/dL (ref 12.0–15.0)
Immature Granulocytes: 2 %
Lymphocytes Relative: 18 %
Lymphs Abs: 0.8 10*3/uL (ref 0.7–4.0)
MCH: 31.8 pg (ref 26.0–34.0)
MCHC: 32 g/dL (ref 30.0–36.0)
MCV: 99.3 fL (ref 80.0–100.0)
Monocytes Absolute: 0.3 10*3/uL (ref 0.1–1.0)
Monocytes Relative: 8 %
Neutro Abs: 3.1 10*3/uL (ref 1.7–7.7)
Neutrophils Relative %: 68 %
Platelets: 235 10*3/uL (ref 150–400)
RBC: 4.25 MIL/uL (ref 3.87–5.11)
RDW: 14.5 % (ref 11.5–15.5)
WBC: 4.4 10*3/uL (ref 4.0–10.5)
nRBC: 0 % (ref 0.0–0.2)

## 2020-07-07 LAB — FIBRINOGEN
Fibrinogen: 495 mg/dL — ABNORMAL HIGH (ref 210–475)
Fibrinogen: 500 mg/dL — ABNORMAL HIGH (ref 210–475)

## 2020-07-07 LAB — SARS CORONAVIRUS 2 BY RT PCR (HOSPITAL ORDER, PERFORMED IN ~~LOC~~ HOSPITAL LAB): SARS Coronavirus 2: POSITIVE — AB

## 2020-07-07 LAB — COMPREHENSIVE METABOLIC PANEL
ALT: 15 U/L (ref 0–44)
AST: 25 U/L (ref 15–41)
Albumin: 3.1 g/dL — ABNORMAL LOW (ref 3.5–5.0)
Alkaline Phosphatase: 61 U/L (ref 38–126)
Anion gap: 10 (ref 5–15)
BUN: 9 mg/dL (ref 8–23)
CO2: 23 mmol/L (ref 22–32)
Calcium: 8.9 mg/dL (ref 8.9–10.3)
Chloride: 106 mmol/L (ref 98–111)
Creatinine, Ser: 1.09 mg/dL — ABNORMAL HIGH (ref 0.44–1.00)
GFR, Estimated: 49 mL/min — ABNORMAL LOW (ref >60)
Glucose, Bld: 131 mg/dL — ABNORMAL HIGH (ref 70–99)
Potassium: 4 mmol/L (ref 3.5–5.1)
Sodium: 139 mmol/L (ref 135–145)
Total Bilirubin: 1.2 mg/dL (ref 0.3–1.2)
Total Protein: 7.6 g/dL (ref 6.5–8.1)

## 2020-07-07 LAB — PROCALCITONIN
Procalcitonin: <0.1 ng/mL
Procalcitonin: <0.1 ng/mL

## 2020-07-07 LAB — D-DIMER, QUANTITATIVE
D-Dimer, Quant: 3.87 ug/mL-FEU — ABNORMAL HIGH (ref 0.00–0.50)
D-Dimer, Quant: 3.92 ug/mL-FEU — ABNORMAL HIGH (ref 0.00–0.50)

## 2020-07-07 LAB — TYPE AND SCREEN
ABO/RH(D): B POS
Antibody Screen: NEGATIVE

## 2020-07-07 LAB — C-REACTIVE PROTEIN
CRP: 3 mg/dL — ABNORMAL HIGH (ref <1.0)
CRP: 2.3 mg/dL — ABNORMAL HIGH (ref <1.0)

## 2020-07-07 LAB — LACTIC ACID, PLASMA
Lactic Acid, Venous: 1.8 mmol/L (ref 0.5–1.9)
Lactic Acid, Venous: 1.4 mmol/L (ref 0.5–1.9)

## 2020-07-07 LAB — TRIGLYCERIDES: Triglycerides: 91 mg/dL (ref <150)

## 2020-07-07 LAB — TROPONIN I (HIGH SENSITIVITY)
Troponin I (High Sensitivity): 58 ng/L — ABNORMAL HIGH (ref <18)
Troponin I (High Sensitivity): 61 ng/L — ABNORMAL HIGH (ref <18)

## 2020-07-07 LAB — ABO/RH: ABO/RH(D): B POS

## 2020-07-07 LAB — BRAIN NATRIURETIC PEPTIDE: B Natriuretic Peptide: 370.7 pg/mL — ABNORMAL HIGH (ref 0.0–100.0)

## 2020-07-07 MED ORDER — ONDANSETRON HCL 4 MG/2ML IJ SOLN: 4.0000 mg | Freq: Four times a day (QID) | INTRAMUSCULAR | Status: DC | PRN

## 2020-07-07 MED ORDER — ASCORBIC ACID 500 MG PO TABS
500.0000 mg | ORAL_TABLET | Freq: Every day | ORAL | Status: DC
  Administered 2020-07-07 – 2020-07-11 (×5): 500 mg via ORAL
  Filled 2020-07-07 (×5): qty 1

## 2020-07-07 MED ORDER — METHYLPREDNISOLONE SODIUM SUCC 125 MG IJ SOLR
0.5000 mg/kg | Freq: Two times a day (BID) | INTRAMUSCULAR | Status: AC
  Administered 2020-07-07 – 2020-07-09 (×6): 50 mg via INTRAVENOUS
  Filled 2020-07-07 (×6): qty 2

## 2020-07-07 MED ORDER — SODIUM CHLORIDE 0.9 % IV SOLN
200.0000 mg | Freq: Once | INTRAVENOUS | Status: AC
  Administered 2020-07-07: 200 mg via INTRAVENOUS
  Filled 2020-07-07: qty 40

## 2020-07-07 MED ORDER — FAMOTIDINE 20 MG PO TABS
20.0000 mg | ORAL_TABLET | Freq: Two times a day (BID) | ORAL | Status: DC
  Administered 2020-07-07 – 2020-07-10 (×7): 20 mg via ORAL
  Filled 2020-07-07 (×7): qty 1

## 2020-07-07 MED ORDER — RIVAROXABAN 15 MG PO TABS
15.0000 mg | ORAL_TABLET | Freq: Every day | ORAL | Status: DC
  Administered 2020-07-07 – 2020-07-10 (×4): 15 mg via ORAL
  Filled 2020-07-07 (×5): qty 1

## 2020-07-07 MED ORDER — FEBUXOSTAT 40 MG PO TABS
40.0000 mg | ORAL_TABLET | Freq: Every day | ORAL | Status: DC
  Administered 2020-07-07 – 2020-07-11 (×5): 40 mg via ORAL
  Filled 2020-07-07 (×5): qty 1

## 2020-07-07 MED ORDER — POTASSIUM CHLORIDE CRYS ER 20 MEQ PO TBCR
20.0000 meq | EXTENDED_RELEASE_TABLET | Freq: Every day | ORAL | Status: DC
  Administered 2020-07-07 – 2020-07-11 (×5): 20 meq via ORAL
  Filled 2020-07-07 (×5): qty 1

## 2020-07-07 MED ORDER — GUAIFENESIN-DM 100-10 MG/5ML PO SYRP: 10.0000 mL | ORAL_SOLUTION | ORAL | Status: DC | PRN

## 2020-07-07 MED ORDER — PREDNISONE 5 MG PO TABS
50.0000 mg | ORAL_TABLET | Freq: Every day | ORAL | Status: DC
  Administered 2020-07-10 – 2020-07-11 (×2): 50 mg via ORAL
  Filled 2020-07-07 (×2): qty 2

## 2020-07-07 MED ORDER — TORSEMIDE 20 MG PO TABS
20.0000 mg | ORAL_TABLET | Freq: Two times a day (BID) | ORAL | Status: DC
  Administered 2020-07-07 – 2020-07-08 (×3): 20 mg via ORAL
  Filled 2020-07-07 (×3): qty 1

## 2020-07-07 MED ORDER — ZINC SULFATE 220 (50 ZN) MG PO CAPS
220.0000 mg | ORAL_CAPSULE | Freq: Every day | ORAL | Status: DC
  Administered 2020-07-07 – 2020-07-11 (×5): 220 mg via ORAL
  Filled 2020-07-07 (×5): qty 1

## 2020-07-07 MED ORDER — HYDROCOD POLST-CPM POLST ER 10-8 MG/5ML PO SUER: 5.0000 mL | Freq: Two times a day (BID) | ORAL | Status: DC | PRN

## 2020-07-07 MED ORDER — ONDANSETRON HCL 4 MG PO TABS: 4.0000 mg | ORAL_TABLET | Freq: Four times a day (QID) | ORAL | Status: DC | PRN

## 2020-07-07 MED ORDER — ISOSORBIDE DINITRATE 30 MG PO TABS
30.0000 mg | ORAL_TABLET | Freq: Three times a day (TID) | ORAL | Status: DC
  Administered 2020-07-07 – 2020-07-11 (×12): 30 mg via ORAL
  Filled 2020-07-07 (×15): qty 1

## 2020-07-07 MED ORDER — HYDRALAZINE HCL 50 MG PO TABS
100.0000 mg | ORAL_TABLET | Freq: Three times a day (TID) | ORAL | Status: DC
  Administered 2020-07-07 – 2020-07-11 (×12): 100 mg via ORAL
  Filled 2020-07-07 (×12): qty 2

## 2020-07-07 MED ORDER — TORSEMIDE 20 MG PO TABS: 40.0000 mg | ORAL_TABLET | Freq: Two times a day (BID) | ORAL | Status: DC

## 2020-07-07 MED ORDER — SODIUM CHLORIDE 0.9 % IV SOLN
100.0000 mg | Freq: Every day | INTRAVENOUS | Status: AC
  Administered 2020-07-08 – 2020-07-11 (×4): 100 mg via INTRAVENOUS
  Filled 2020-07-07 (×5): qty 20

## 2020-07-07 MED ORDER — SODIUM CHLORIDE 0.9% FLUSH
3.0000 mL | Freq: Two times a day (BID) | INTRAVENOUS | Status: DC
  Administered 2020-07-07 – 2020-07-11 (×9): 3 mL via INTRAVENOUS

## 2020-07-07 MED ORDER — DEXAMETHASONE SODIUM PHOSPHATE 10 MG/ML IJ SOLN: 10.0000 mg | Freq: Once | INTRAMUSCULAR | Status: DC

## 2020-07-07 MED ORDER — ALBUTEROL SULFATE HFA 108 (90 BASE) MCG/ACT IN AERS
2.0000 | INHALATION_SPRAY | Freq: Four times a day (QID) | RESPIRATORY_TRACT | Status: DC
  Administered 2020-07-07 – 2020-07-09 (×8): 2 via RESPIRATORY_TRACT
  Filled 2020-07-07 (×2): qty 6.7

## 2020-07-07 MED ORDER — PINDOLOL 5 MG PO TABS
5.0000 mg | ORAL_TABLET | Freq: Two times a day (BID) | ORAL | Status: DC
  Administered 2020-07-07 – 2020-07-11 (×9): 5 mg via ORAL
  Filled 2020-07-07 (×11): qty 1

## 2020-07-07 MED ORDER — COLCHICINE 0.6 MG PO TABS
0.6000 mg | ORAL_TABLET | Freq: Every day | ORAL | Status: DC
  Administered 2020-07-07 – 2020-07-11 (×5): 0.6 mg via ORAL
  Filled 2020-07-07 (×5): qty 1

## 2020-07-07 MED ORDER — ROSUVASTATIN CALCIUM 20 MG PO TABS
20.0000 mg | ORAL_TABLET | Freq: Every evening | ORAL | Status: DC
  Administered 2020-07-07 – 2020-07-10 (×4): 20 mg via ORAL
  Filled 2020-07-07 (×4): qty 1

## 2020-07-07 NOTE — ED Notes (Signed)
Pt had incontinent of bowel, diarrhea. Peri care provided, and linens changed at this time. pT Placed on 2 liters [place] after reporting increased sob with exertion. Remains on cardiac monitor, call bell in place.

## 2020-07-07 NOTE — ED Provider Notes (Signed)
[facility] EMERGENCY DEPARTMENT Provider Note   CSN: [id number] Arrival date & time: 07/07/20  0756     History Chief Complaint  Patient presents with  . Shortness of Breath    Shannon [name] is a 87 y.o. female with a past medical history significant for chronic diastolic congestive heart failure, A. fib on chronic Xarelto, hyperlipidemia, hypertension, mitral regurgitation, and gout who presents to the ED due to shortness of breath x1 day.  Patient states she began feeling acutely short of breath yesterday and used her husband's oxygen at home with mild improvement in symptoms. She is not currently on home oxygen. Patient's husband tested positive for COVID on 07/01/2020.  Patient denies any symptoms prior to yesterday.  Denies cough, sore throat, and rhinorrhea.  She has received 2 COVID vaccines however, no booster shot.  Patient notes she has been compliant with her Xarelto.  Denies fever and chills.  Denies abdominal pain, nausea, vomiting, diarrhea.  Shortness of breath is both at rest and with exertion.  History obtained from patient and past medical records. No interpreter used during encounter.      Past Medical History:  Diagnosis Date  . Acute exacerbation of CHF (congestive heart failure) ([place]) 09/24/2017  . Arthritis    "all over"  . Atrial fibrillation ([place])   . Chronic diastolic CHF (congestive heart failure) ([place]) 09/24/2017  . Chronic kidney disease   . Dyspnea   . Gout   . Hypercholesteremia   . Hypertension   . Mitral regurgitation     Patient Active Problem List   Diagnosis Date Noted  . Pneumonia due to COVID-19 virus 07/07/2020  . Hyperlipidemia 09/24/2017  . Hypertension 09/24/2017  . Chronic diastolic CHF (congestive heart failure) ([place]) 09/24/2017  . Gout 09/24/2017  . Osteoarthritis of right knee 08/04/2017  . Paroxysmal atrial fibrillation ([place]) 10/25/2016  . Renal arterial hypertension 07/31/2014    Past Surgical History:   Procedure Laterality Date  . BREAST BIOPSY Left 1984  . CARDIOVERSION N/A 10/27/2016   Procedure: CARDIOVERSION;  Surgeon: Adrian Prows, MD;  Location: [facility];  Service: Cardiovascular;  Laterality: N/A;  . CATARACT EXTRACTION, BILATERAL Bilateral 2000's  . RENAL ANGIOGRAM Bilateral 07/31/2014   Procedure: RENAL ANGIOGRAM;  Surgeon: Laverda Page, MD;  Location: [facility] CATH LAB;  Service: Cardiovascular;  Laterality: Bilateral;     OB History   No obstetric history on file.     Family History  Problem Relation Age of Onset  . Heart attack Father   . Breast cancer Neg Hx     Social History   Tobacco Use  . Smoking status: Never Smoker  . Smokeless tobacco: Former Systems developer    Types: Snuff  . Tobacco comment: "quit using snuff in the 1960's"  Vaping Use  . Vaping Use: Never used  Substance Use Topics  . Alcohol use: No  . Drug use: No    Home Medications Prior to Admission medications   Medication Sig Start Date End Date Taking? Authorizing Provider  colchicine 0.6 MG tablet Take 0.6 mg by mouth daily.      [provider]  Febuxostat 80 MG TABS Take 0.5 tablets by mouth daily. 06/18/20   [provider]  hydrALAZINE (APRESOLINE) 100 MG tablet Take 100 mg by mouth 3 (three) times daily. 05/27/20   [provider]  hydrALAZINE (APRESOLINE) 50 MG tablet TAKE 1 TABLET BY MOUTH THREE TIMES DAILY Patient not taking: Reported on 07/07/2020 11/08/18  Adrian Prows, MD  isosorbide dinitrate (ISORDIL) 30 MG tablet TAKE 1 TABLET(30 MG) BY MOUTH THREE TIMES DAILY 09/19/19   Adrian Prows, MD  LUMIGAN 0.01 % SOLN Place 1 drop into both eyes at bedtime. 09/22/17   [provider]  metolazone (ZAROXOLYN) 5 MG tablet Take 1 tablet (5 mg total) by mouth daily as needed for up to 30 days (Fluid build up). Take one tablet daily for 4 days then as needed. No more than 2 per week 10/13/18 03/29/19  Adrian Prows, MD  pindolol (VISKEN) 5 MG tablet TAKE 1 TABLET(5 MG) BY MOUTH  TWICE DAILY 03/25/20   Adrian Prows, MD  Potassium Chloride ER 20 MEQ TBCR Take 1 tablet by mouth daily. 06/04/20   [provider]  potassium chloride SA (K-DUR,KLOR-CON) 20 MEQ tablet Take 1 tablet (20 mEq total) by mouth daily. Patient not taking: Reported on 07/07/2020 09/26/17   Kayleen Memos, DO  rosuvastatin (CRESTOR) 20 MG tablet Take 20 mg by mouth every evening.      [provider]  torsemide (DEMADEX) 20 MG tablet Take 2 tablets (40 mg total) by mouth 2 (two) times daily at 10 am and 4 pm. Two tablets BID for 3 days only starting 09/19/17 Patient taking differently: Take 40 mg by mouth 2 (two) times daily. 09/26/18   Adrian Prows, MD  XARELTO 15 MG TABS tablet TAKE 1 TABLET BY MOUTH EVERY EVENING AFTER DINNER 12/01/19   Adrian Prows, MD    Allergies    Amlodipine and Zestoretic [lisinopril-hydrochlorothiazide]  Review of Systems   Review of Systems  Constitutional: Negative for chills and fever.  Respiratory: Positive for shortness of breath. Negative for cough.   Cardiovascular: Positive for leg swelling (chronic). Negative for chest pain.  Gastrointestinal: Negative for abdominal pain, diarrhea, nausea and vomiting.  All other systems reviewed and are negative.   Physical Exam Updated Vital Signs BP (!) 183/116   Pulse 94   Temp 97.8 F (36.6 C) (Oral)   Resp (!) 25   SpO2 95%   Physical Exam Vitals and nursing note reviewed.  Constitutional:      General: She is not in acute distress.    Appearance: She is ill-appearing.  HENT:     Head: Normocephalic.  Eyes:     Pupils: Pupils are equal, round, and reactive to light.  Cardiovascular:     Rate and Rhythm: Normal rate and regular rhythm.     Pulses: Normal pulses.     Heart sounds: Normal heart sounds. No murmur heard. No friction rub. No gallop.   Pulmonary:     Effort: Pulmonary effort is normal.     Comments: Slight rales at base of lungs Abdominal:     General: Abdomen is flat. Bowel sounds  are normal. There is no distension.     Palpations: Abdomen is soft.     Tenderness: There is no abdominal tenderness. There is no guarding or rebound.  Musculoskeletal:     Cervical back: Neck supple.     Comments: 2+ pitting edema bilaterally.  Skin:    General: Skin is warm and dry.  Neurological:     General: No focal deficit present.     Mental Status: She is alert.  Psychiatric:        Mood and Affect: Mood normal.        Behavior: Behavior normal.     ED Results / Procedures / Treatments   Labs (all labs ordered are listed,  but only abnormal results are displayed) Labs Reviewed  SARS CORONAVIRUS 2 BY RT PCR ([facility]) - Abnormal; Notable for the following components:      Result Value   SARS Coronavirus 2 POSITIVE (*)    All other components within normal limits  BRAIN NATRIURETIC PEPTIDE - Abnormal; Notable for the following components:   B Natriuretic Peptide 370.7 (*)    All other components within normal limits  COMPREHENSIVE METABOLIC PANEL - Abnormal; Notable for the following components:   Glucose, Bld 131 (*)    Creatinine, Ser 1.09 (*)    Albumin 3.1 (*)    GFR, Estimated 49 (*)    All other components within normal limits  TROPONIN I (HIGH SENSITIVITY) - Abnormal; Notable for the following components:   Troponin I (High Sensitivity) 58 (*)    All other components within normal limits  CULTURE, BLOOD (ROUTINE X 2)  CULTURE, BLOOD (ROUTINE X 2)  CBC WITH DIFFERENTIAL/PLATELET  LACTIC ACID, PLASMA  LACTIC ACID, PLASMA  D-DIMER, QUANTITATIVE (NOT AT [facility])  PROCALCITONIN  LACTATE DEHYDROGENASE  FERRITIN  TRIGLYCERIDES  FIBRINOGEN  C-REACTIVE PROTEIN  TROPONIN I (HIGH SENSITIVITY)    EKG EKG Interpretation  Date/Time:  Sunday July 07 2020 08:17:25 EST Ventricular Rate:  111 PR Interval:    QRS Duration: 76 QT Interval:  360 QTC Calculation: 489 R Axis:   71 Text Interpretation: p wave amplitude  diminished likely nsr vs afib Low voltage QRS Septal infarct , age undetermined Possible Lateral infarct , age undetermined Abnormal ECG No significant change since last tracing Confirmed by Floyd, Dan ([id number]) on 07/07/2020 9:24:07 AM   Radiology DG Chest Portable 1 View  Result Date: 07/07/2020 CLINICAL DATA:  Shortness of breath. EXAM: PORTABLE CHEST 1 VIEW COMPARISON:  March 11, 2018 FINDINGS: The mediastinal contour and cardiac silhouette are stable. The heart size enlarged. Patchy consolidation of bilateral lung bases are noted. There is no pulmonary edema. Degenerative joint changes of bilateral shoulders are noted. IMPRESSION: Bilateral lung base pneumonias. Electronically Signed   By: Wei-Chen  Lin M.D.   On: 07/07/2020 08:47    Procedures Procedures (including critical care time)  Medications Ordered in ED Medications  remdesivir 200 mg in sodium chloride 0.9% 250 mL IVPB (has no administration in time range)    Followed by  remdesivir 100 mg in sodium chloride 0.9 % 100 mL IVPB (has no administration in time range)    ED Course  I have reviewed the triage vital signs and the nursing notes.  Pertinent labs & imaging results that were available during my care of the patient were reviewed by me and considered in my medical decision making (see chart for details).  Clinical Course as of 07/07/20 1059  Sun Jul 07, 2020  0956 SARS Coronavirus 2(!): POSITIVE [CA]  0956 B Natriuretic Peptide(!): 370.7 [CA]  0956 Troponin I (High Sensitivity)(!): 58 [CA]  1017 Ambulate patient in the room during initial evaluation. O2 saturation dropped to 90%, HR 140-150s, RR20-30. Patient visibly short of breath. [CA]    Clinical Course User Index [CA] ,  C, PA-C   MDM Rules/Calculators/A&P                         87  year old female presents to the ED due to shortness of breath.  Patient's husband tested positive for COVID on 07/01/2020.  Patient has a history of A. fib and is  chronically on Xarelto  which she has been compliant with.  Upon arrival, patient afebrile mildly tachycardic at 107.  During my initial evaluation, I ambulated patient without oxygen and she dropped to 90% with a heart rate in the 140s.  Patient tachypneic and visibly out of breath.  Routine labs, troponin, BNP, chest x-ray, EKG ordered at triage.  Patient was found to be COVID positive. Will add [place] preadmission labs.  Remdesivir and Decadron started. Given patient has been compliant with Xarelto, low suspicion for PE. Suspect shortness of breath related to COVID infection. Discussed case with Dr. Tyrone Nine who evaluated patient at bedside and agrees with assessment and plan.  CBC unremarkable with no leukocytosis and normal hemoglobin.  MP significant for mild hyperglycemia 131 with no anion gap.  Doubt DKA.  Mild elevation creatinine at 1.09 normal BUN.  BNP slightly elevated at 370.  Initial troponin elevated at 58.  Will obtain delta troponin to rule out ACS.  X-ray personally reviewed which demonstrates bilateral pneumonia likely due to COVID infection.  EKG personally reviewed which demonstrates diminished P wave amplitude which makes it difficult to discern between normal sinus rhythm versus A. fib.  No changes from previous EKG.  Discussed case with Dr. Tamala Julian with [facility] who agrees to admit patient for further treatment.  Shannon [name] was evaluated in Emergency Department on 07/07/2020 for the symptoms described in the history of present illness. She was evaluated in the context of the global COVID-19 pandemic, which necessitated consideration that the patient might be at risk for infection with the SARS-CoV-2 virus that causes COVID-19. Institutional protocols and algorithms that pertain to the evaluation of patients at risk for COVID-19 are in a state of rapid change based on information released by regulatory bodies including the CDC and federal and state organizations. These policies and  algorithms were followed during the patient's care in the ED. Final Clinical Impression(s) / ED Diagnoses Final diagnoses:  COVID-19 virus infection    Rx / DC Orders ED Discharge Orders    None       Suzy Bouchard, PA-C 07/07/20 [place], [employer], DO 07/07/20 1118

## 2020-07-07 NOTE — H&P (Signed)
History and Physical    Shannon [name] NIO:[id number] DOB: Mar 10, 1931 DOA: 07/07/2020  Referring MD/NP/PA: Charmaine Downs, PA-C PCP: [name], MD  Patient coming from: home  Chief Complaint: Shortness of breath  I have personally briefly reviewed patient's old medical records in [place]   HPI: Shannon [name] is a 87 y.o. female with medical history significant of chronic diastolic CHF, A. fib, HTN, HLD, CKD stage III, and gout presents with complaints of worsening shortness of breath over the last day.  Husband tested positive for COVID-19 07/01/2020 and is currently hospitalized here at [facility].  She initially states that she has had no symptoms besides shortness of breath.  However, she admits to having associated symptoms of productive cough, headache, decreased appetite, malaise, and leg swelling.  Denies having any significant fever, change in smell/taste, vomiting, diarrhea, abdominal pain.  She noticed that she was having a difficult time taking deep inspiratory breath, but denied any chest pain.  She is on Xarelto due to a history of atrial fibrillation and has been taking it regularly.  Patient has been using her husband's oxygen of 2 L at home with some relief, but is not normally on oxygen at baseline.  She noticed that she was short of breath even at rest especially with getting up and moving around.  Her granddaughter brought her here to the hospital for further evaluation.  Patient had received initial 2 COVID vaccines, but had received a booster yet  ED Course: Upon admission into the emergency department patient was noted to be afebrile, pulse 94-1 07, respiration 20-25, blood pressures elevated up to 183/116, and O2 saturations noted as low as 90% on room air and was placed on 2 L nasal cannula oxygen with improvement to 94-95%.  Labs significant for CBC within normal limits, BUN 9, creatinine 1.09, albumin 3.1, BNP 370.7, troponin 58 COVID-19 screening was  positive.  Chest x-ray revealed bilateral lung base pneumonias.  Patient was started on remdesivir.   Review of Systems  Constitutional: Positive for malaise/fatigue. Negative for fever.  HENT: Negative for ear discharge and ear pain.   Eyes: Negative for photophobia and pain.  Respiratory: Positive for cough, sputum production and shortness of breath.   Cardiovascular: Positive for leg swelling. Negative for chest pain.  Gastrointestinal: Negative for abdominal pain, nausea and vomiting.  Genitourinary: Negative for dysuria and hematuria.  Musculoskeletal: Negative for falls.  Skin: Negative for rash.  Neurological: Positive for weakness and headaches. Negative for focal weakness and loss of consciousness.  Endo/Heme/Allergies: Negative for polydipsia.  Psychiatric/Behavioral: Positive for memory loss. Negative for substance abuse.    Past Medical History:  Diagnosis Date  . Acute exacerbation of CHF (congestive heart failure) ([place]) 09/24/2017  . Arthritis    "all over"  . Atrial fibrillation ([place])   . Chronic diastolic CHF (congestive heart failure) ([place]) 09/24/2017  . Chronic kidney disease   . Dyspnea   . Gout   . Hypercholesteremia   . Hypertension   . Mitral regurgitation     Past Surgical History:  Procedure Laterality Date  . BREAST BIOPSY Left 1984  . CARDIOVERSION N/A 10/27/2016   Procedure: CARDIOVERSION;  Surgeon: Adrian Prows, MD;  Location: [place];  Service: Cardiovascular;  Laterality: N/A;  . CATARACT EXTRACTION, BILATERAL Bilateral 2000's  . RENAL ANGIOGRAM Bilateral 07/31/2014   Procedure: RENAL ANGIOGRAM;  Surgeon: Laverda Page, MD;  Location: [facility] CATH LAB;  Service: Cardiovascular;  Laterality: Bilateral;  reports that she has never smoked. She has quit using smokeless tobacco.  Her smokeless tobacco use included snuff. She reports that she does not drink alcohol and does not use drugs.  Allergies  Allergen Reactions  . Amlodipine Swelling     Intolerance, LE edema   . Zestoretic [Lisinopril-Hydrochlorothiazide] Itching and Rash    Family History  Problem Relation Age of Onset  . Heart attack Father   . Breast cancer Neg Hx     Prior to Admission medications   Medication Sig Start Date End Date Taking? Authorizing Provider  colchicine 0.6 MG tablet Take 0.6 mg by mouth daily.      [provider]  Febuxostat 80 MG TABS Take 0.5 tablets by mouth daily. 06/18/20   [provider]  hydrALAZINE (APRESOLINE) 100 MG tablet Take 100 mg by mouth 3 (three) times daily. 05/27/20   [provider]  hydrALAZINE (APRESOLINE) 50 MG tablet TAKE 1 TABLET BY MOUTH THREE TIMES DAILY Patient not taking: Reported on 07/07/2020 11/08/18   Adrian Prows, MD  isosorbide dinitrate (ISORDIL) 30 MG tablet TAKE 1 TABLET(30 MG) BY MOUTH THREE TIMES DAILY 09/19/19   Adrian Prows, MD  LUMIGAN 0.01 % SOLN Place 1 drop into both eyes at bedtime. 09/22/17   [provider]  metolazone (ZAROXOLYN) 5 MG tablet Take 1 tablet (5 mg total) by mouth daily as needed for up to 30 days (Fluid build up). Take one tablet daily for 4 days then as needed. No more than 2 per week 10/13/18 03/29/19  Adrian Prows, MD  pindolol (VISKEN) 5 MG tablet TAKE 1 TABLET(5 MG) BY MOUTH TWICE DAILY 03/25/20   Adrian Prows, MD  Potassium Chloride ER 20 MEQ TBCR Take 1 tablet by mouth daily. 06/04/20   [provider]  potassium chloride SA (K-DUR,KLOR-CON) 20 MEQ tablet Take 1 tablet (20 mEq total) by mouth daily. Patient not taking: Reported on 07/07/2020 09/26/17   Kayleen Memos, DO  rosuvastatin (CRESTOR) 20 MG tablet Take 20 mg by mouth every evening.      [provider]  torsemide (DEMADEX) 20 MG tablet Take 2 tablets (40 mg total) by mouth 2 (two) times daily at 10 am and 4 pm. Two tablets BID for 3 days only starting 09/19/17 Patient taking differently: Take 40 mg by mouth 2 (two) times daily. 09/26/18   Adrian Prows, MD  XARELTO 15 MG TABS tablet  TAKE 1 TABLET BY MOUTH EVERY EVENING AFTER DINNER 12/01/19   Adrian Prows, MD    Physical Exam:  Constitutional: Elderly female who appears not to feel well, but in no acute distress Vitals:   07/07/20 0811 07/07/20 1029  BP: (!) 169/96 (!) 183/116  Pulse: (!) 107 94  Resp: 20 (!) 25  Temp: 97.8 F (36.6 C)   TempSrc: Oral   SpO2: 99% 95%   Eyes: PERRL, lids and conjunctivae normal ENMT: Mucous membranes are moist. Posterior pharynx clear of any exudate or lesions.  Neck: normal, supple, no masses, no thyromegaly Respiratory: Mildly tachypneic with bibasilar crackles appreciated but no significant wheezes or rhonchi.  Patient currently on room air with O2 saturations 97%. Cardiovascular: Irregularly irregular and tachycardic, no murmurs / rubs / gallops.  +1 lower extremity edema worse on the left leg. 2+ pedal pulses. No carotid bruits.  Abdomen: no tenderness, no masses palpated. No hepatosplenomegaly. Bowel sounds positive.  Musculoskeletal: no clubbing / cyanosis. No joint deformity upper and lower extremities. Good ROM, no contractures. Normal muscle  tone.  Skin: no rashes, lesions, ulcers. No induration Neurologic: CN 2-12 grossly intact. Sensation intact, DTR normal. Strength 5/5 in all 4.  Psychiatric: Normal judgment and insight. Alert and oriented x 3. Normal mood.     Labs on Admission: I have personally reviewed following labs and imaging studies  CBC: Recent Labs  Lab 07/07/20 0834  WBC 4.4  NEUTROABS 3.1  HGB 13.5  HCT 42.2  MCV 99.3  PLT 440   Basic Metabolic Panel: Recent Labs  Lab 07/07/20 0834  NA 139  K 4.0  CL 106  CO2 23  GLUCOSE 131*  BUN 9  CREATININE 1.09*  CALCIUM 8.9   GFR: CrCl cannot be calculated (Unknown ideal weight.). Liver Function Tests: Recent Labs  Lab 07/07/20 0834  AST 25  ALT 15  ALKPHOS 61  BILITOT 1.2  PROT 7.6  ALBUMIN 3.1*   No results for input(s): LIPASE, AMYLASE in the last 168 hours. No results for  input(s): AMMONIA in the last 168 hours. Coagulation Profile: No results for input(s): INR, PROTIME in the last 168 hours. Cardiac Enzymes: No results for input(s): CKTOTAL, CKMB, CKMBINDEX, TROPONINI in the last 168 hours. BNP (last 3 results) No results for input(s): PROBNP in the last 8760 hours. HbA1C: No results for input(s): HGBA1C in the last 72 hours. CBG: No results for input(s): GLUCAP in the last 168 hours. Lipid Profile: No results for input(s): CHOL, HDL, LDLCALC, TRIG, CHOLHDL, LDLDIRECT in the last 72 hours. Thyroid Function Tests: No results for input(s): TSH, T4TOTAL, FREET4, T3FREE, THYROIDAB in the last 72 hours. Anemia Panel: No results for input(s): VITAMINB12, FOLATE, FERRITIN, TIBC, IRON, RETICCTPCT in the last 72 hours. Urine analysis:    Component Value Date/Time   COLORURINE STRAW (A) 09/24/2017 1815   APPEARANCEUR CLEAR 09/24/2017 1815   LABSPEC 1.006 09/24/2017 1815   PHURINE 7.0 09/24/2017 1815   GLUCOSEU NEGATIVE 09/24/2017 1815   HGBUR SMALL (A) 09/24/2017 1815   BILIRUBINUR NEGATIVE 09/24/2017 1815   KETONESUR NEGATIVE 09/24/2017 1815   PROTEINUR NEGATIVE 09/24/2017 1815   NITRITE NEGATIVE 09/24/2017 1815   LEUKOCYTESUR NEGATIVE 09/24/2017 1815   Sepsis Labs: Recent Results (from the past 240 hour(s))  SARS Coronavirus 2 by RT PCR (hospital order, performed in [place] hospital lab) Nasopharyngeal Nasopharyngeal Swab     Status: Abnormal   Collection Time: 07/07/20  8:16 AM   Specimen: Nasopharyngeal Swab  Result Value Ref Range Status   SARS Coronavirus 2 POSITIVE (A) NEGATIVE Final    Comment: RESULT CALLED TO, READ BACK BY AND VERIFIED WITH: RN KAREN N [id number] [phone] MLM (NOTE) SARS-CoV-2 target nucleic acids are DETECTED  SARS-CoV-2 RNA is generally detectable in upper respiratory specimens  during the acute phase of infection.  Positive results are indicative  of the presence of the identified virus, but do not rule out bacterial  infection or co-infection with other pathogens not detected by the test.  Clinical correlation with patient history and  other diagnostic information is necessary to determine patient infection status.  The expected result is negative.  Fact Sheet for Patients:   StrictlyIdeas.no   Fact Sheet for Healthcare Providers:   BankingDealers.co.za    This test is not yet approved or cleared by the [place] FDA and  has been authorized for detection and/or diagnosis of SARS-CoV-2 by FDA under an Emergency Use Authorization (EUA).  This EUA will remain in effect (meaning this test can b e used) for the duration of  the COVID-19  declaration under Section 564(b)(1) of the Act, 21 U.S.C. section 360-bbb-3(b)(1), unless the authorization is terminated or revoked sooner.  Performed at [facility], [place] [address] [place], [place] [address]      Radiological Exams on Admission: DG Chest Portable 1 View  Result Date: 07/07/2020 CLINICAL DATA:  Shortness of breath. EXAM: PORTABLE CHEST 1 VIEW COMPARISON:  March 11, 2018 FINDINGS: The mediastinal contour and cardiac silhouette are stable. The heart size enlarged. Patchy consolidation of bilateral lung bases are noted. There is no pulmonary edema. Degenerative joint changes of bilateral shoulders are noted. IMPRESSION: Bilateral lung base pneumonias. Electronically Signed   By: Abelardo Diesel M.D.   On: 07/07/2020 08:47    EKG: Independently reviewed.  Atrial fibrillation at 111 bpm  Assessment/Plan Pneumonia due to COVID-19: Acute patient presents with complaints of worsening shortness of breath, intermittent productive cough, and generalized malaise after her husband had been [facility] into [facility] on 1[facility]17..  O2 saturations dropped to around 90% on room air ambulation.  Chest x-ray significant for bilateral lung base pneumonia.  Patient had been started on remdesivir -Admit to a medical  telemetry bed -Continuous pulse oximetry with nasal cannula oxygen to maintain O2 saturation greater than 90% -Prone as able -Check inflammatory markers and monitor daily -PT consulted -Remdesivir per pharmacy -Solu-Medrol IV -Albuterol inhaler -Vitamin C and zinc -Antitussives as needed cough and congestion -Tylenol as needed for fever, mild pain, headache  Paroxysmal atrial fibrillation on chronic anticoagulation: Patient rate initially noted to be elevated up to 140s in A. fib.  Home medications include Xarelto which patient reports he has been taking as prescribed.CHA2DS2-VASc score = at least 5( based off age, sex, CHF, HTN) -Continue Xarelto and beta-blocker  Elevated troponin: Acute. Initial troponin 58.  Denies any complaints of chest pain.  Suspect secondary to demand with elevated heart rates and respiratory distress. -Continue to monitor.  Chronic diastolic congestive heart failure: Patient currently appears to be euvolemic.  On exam appreciated trace to 1+ lower extremity edema.  BNP was elevated at 370.7 although chest x-ray did not show any significant signs of edema.  Last EF noted to be 50 to 55% with grade 2 diastolic dysfunction in 07/4266.   -Intake and output -Daily weights -Continue torsemide reassess need of continuation of 20 mg twice daily in a.m.  Essential hypertension: Blood pressures 148/82-183/116.  Patient had not taken any of her home blood pressure medications this morning.  Home blood pressure medications include hydralazine 100 mg 3 times daily, pindolol 5 mg twice daily, isosorbide dinitrate 30 mg 3 times daily, and torsemide 40 mg twice daily per pharmacy reconciliation (patient states that she is only on torsemide 20 mg twice daily).  -Continue current home regimen, but will need to reassess continuation of torosemide as it appears patient was given a 5-day supply on 1/6.   Hyperlipidemia: Home medications include Crestor 20 mg q.evening -Continue  statin  Chronic kidney disease stage IIIb: On admission creatinine 1.09 which appears around patient's baseline. -Continue to monitor  History of gout: Patient without acute flare at this time -Continue current medication  DVT prophylaxis: Xarelto Code Status: Full Family Communication: Daughter updated over the phone  Disposition Plan: Hopefully discharge home once medically Consults called: None Admission status: Inpatient, require more than 2 midnight stay due to Pneumonia due to COVID-19 in elderly frail patient  61 in elderly  Norval Morton MD [facility]   If 7PM-7AM, please contact night-coverage  07/07/2020, 10:52 AM

## 2020-07-07 NOTE — ED Triage Notes (Signed)
C/O of shortness of breath, stated on home o2 at 2L via [place] at all times. Reported husband currently hospitalized for Covid

## 2020-07-07 NOTE — ED Notes (Signed)
Pt had another bowel movement, peri care provided at this time.

## 2020-07-08 ENCOUNTER — Inpatient Hospital Stay (HOSPITAL_COMMUNITY): Payer: Medicare Other

## 2020-07-08 ENCOUNTER — Encounter (HOSPITAL_COMMUNITY): Payer: Self-pay | Admitting: Internal Medicine

## 2020-07-08 DIAGNOSIS — J9601 Acute respiratory failure with hypoxia: Secondary | ICD-10-CM | POA: Diagnosis not present

## 2020-07-08 DIAGNOSIS — U071 COVID-19: Secondary | ICD-10-CM

## 2020-07-08 DIAGNOSIS — R7989 Other specified abnormal findings of blood chemistry: Secondary | ICD-10-CM

## 2020-07-08 DIAGNOSIS — M7989 Other specified soft tissue disorders: Secondary | ICD-10-CM

## 2020-07-08 LAB — COMPREHENSIVE METABOLIC PANEL
ALT: 16 U/L (ref 0–44)
AST: 21 U/L (ref 15–41)
Albumin: 2.7 g/dL — ABNORMAL LOW (ref 3.5–5.0)
Alkaline Phosphatase: 58 U/L (ref 38–126)
Anion gap: 9 (ref 5–15)
BUN: 15 mg/dL (ref 8–23)
CO2: 23 mmol/L (ref 22–32)
Calcium: 8.6 mg/dL — ABNORMAL LOW (ref 8.9–10.3)
Chloride: 108 mmol/L (ref 98–111)
Creatinine, Ser: 1.16 mg/dL — ABNORMAL HIGH (ref 0.44–1.00)
GFR, Estimated: 46 mL/min — ABNORMAL LOW (ref >60)
Glucose, Bld: 119 mg/dL — ABNORMAL HIGH (ref 70–99)
Potassium: 4.1 mmol/L (ref 3.5–5.1)
Sodium: 140 mmol/L (ref 135–145)
Total Bilirubin: 1 mg/dL (ref 0.3–1.2)
Total Protein: 6.9 g/dL (ref 6.5–8.1)

## 2020-07-08 LAB — CBC WITH DIFFERENTIAL/PLATELET
Abs Immature Granulocytes: 0.04 10*3/uL (ref 0.00–0.07)
Basophils Absolute: 0 10*3/uL (ref 0.0–0.1)
Basophils Relative: 0 %
Eosinophils Absolute: 0 10*3/uL (ref 0.0–0.5)
Eosinophils Relative: 0 %
HCT: 36.2 % (ref 36.0–46.0)
Hemoglobin: 12 g/dL (ref 12.0–15.0)
Immature Granulocytes: 1 %
Lymphocytes Relative: 14 %
Lymphs Abs: 0.6 10*3/uL — ABNORMAL LOW (ref 0.7–4.0)
MCH: 31.9 pg (ref 26.0–34.0)
MCHC: 33.1 g/dL (ref 30.0–36.0)
MCV: 96.3 fL (ref 80.0–100.0)
Monocytes Absolute: 0.1 10*3/uL (ref 0.1–1.0)
Monocytes Relative: 3 %
Neutro Abs: 3.3 10*3/uL (ref 1.7–7.7)
Neutrophils Relative %: 82 %
Platelets: 243 10*3/uL (ref 150–400)
RBC: 3.76 MIL/uL — ABNORMAL LOW (ref 3.87–5.11)
RDW: 14.3 % (ref 11.5–15.5)
WBC: 4 10*3/uL (ref 4.0–10.5)
nRBC: 0 % (ref 0.0–0.2)

## 2020-07-08 LAB — C-REACTIVE PROTEIN: CRP: 2.2 mg/dL — ABNORMAL HIGH (ref <1.0)

## 2020-07-08 LAB — D-DIMER, QUANTITATIVE: D-Dimer, Quant: 3.81 ug/mL-FEU — ABNORMAL HIGH (ref 0.00–0.50)

## 2020-07-08 LAB — HEPATITIS B SURFACE ANTIGEN: Hepatitis B Surface Ag: NONREACTIVE

## 2020-07-08 LAB — FERRITIN: Ferritin: 137 ng/mL (ref 11–307)

## 2020-07-08 LAB — PHOSPHORUS: Phosphorus: 2.9 mg/dL (ref 2.5–4.6)

## 2020-07-08 LAB — MAGNESIUM: Magnesium: 2 mg/dL (ref 1.7–2.4)

## 2020-07-08 NOTE — Progress Notes (Signed)
PT Cancellation Note  Patient Details Name: Shannon [name] MRN: [id number] DOB: March 21, 1931   Cancelled Treatment:    Reason Eval/Treat Not Completed: Patient at procedure or test/unavailable   Currently with Vascular Imaging;   Will follow up later today as time allows;  Otherwise, will follow up for PT tomorrow;   Thank you,  Roney Marion, PT  Acute Rehabilitation Services Pager [phone] Office [phone]     Shannon [name] 07/08/2020, 4:50 PM

## 2020-07-08 NOTE — Progress Notes (Signed)
PROGRESS NOTE                                                                                                                                                                                                             Patient Demographics:    Shannon [name], is a 87 y.o. female, DOB - [birth year], BUL:[id number]  Outpatient Primary MD for the patient is Shannon Gravel, MD   Admit date - 07/07/2020   LOS - 1  Chief Complaint  Patient presents with  . Shortness of Breath       Brief Narrative: Patient is a 87 y.o. female with PMHx of chronic diastolic heart failure, atrial fibrillation on anticoagulation, HTN, CKD stage IIIb, HLD, gout,-who presented with shortness of breath-found to have acute hypoxic respiratory failure due to COVID-19 pneumonia.    Per patient-her spouse is also in the hospital due to COVID-19 pneumonia.  COVID-19 vaccinated status: Vaccinated-but not boosted.  Significant Events: 1/23>> Admit to [facility] for hypoxia due to COVID-19 pneumonia  Significant studies: 1/23>>Chest x-ray: Bilateral lung infiltrates  COVID-19 medications: Steroids:1/23>> Remdesivir:1/23>>  Antibiotics: None  Microbiology data: 1/23>>Blood culture:pending  Procedures: None  Consults: None  DVT prophylaxis: Rivaroxaban (XARELTO) tablet 15 mg     Subjective:    Shannon [name] today feels much better-titrated to room air this morning.   Assessment  & Plan :   Acute Hypoxic Resp Failure due to Covid 19 Viral pneumonia: Had mild disease-hypoxia has resolved-she is on room air this morning.  Continue steroid/Remdesivir.  Mobilize and see how she does-await PT/OT eval.  Fever: afebrile O2 requirements:  SpO2: 93 % O2 Flow Rate (L/min): 2 L/min   COVID-19 Labs: Recent Labs    07/07/20 1058 07/07/20 2223 07/08/20 0108  DDIMER 3.87* 3.92* 3.81*  FERRITIN 131 134 137  LDH 242* 245*  --   CRP 3.0* 2.3* 2.2*        Component Value Date/Time   BNP 370.7 (H) 07/07/2020 0834    Recent Labs  Lab 07/07/20 1058 07/07/20 2223  PROCALCITON <0.10 <0.10    Lab Results  Component Value Date   SARSCOV2NAA POSITIVE (A) 07/07/2020     Prone/Incentive Spirometry: encouraged  incentive spirometry use 3-4/hour.  Elevated D-dimer: Likely due to COVID-19 related inflammation-on chronic anticoagulation with Xarelto-therefore doubt VTE-however we will go and obtain a lower  extremity Doppler.  PAF: Maintaining sinus rhythm-continue pindolol-on Xarelto  Minimally elevated troponin: Due to demand ischemia-doubt ACS.  Chronic diastolic heart failure: Euvolemic on exam-continue torsemide.  HTN: BP stable-continue hydralazine, pindolol, Imdur and Demadex.  HLD: Continue Crestor  CKD stage IIIb: Creatinine at baseline-monitor closely.  History of gout: No flare-continue colchicine and Uloric  Obesity: Estimated body mass index is 33.3 kg/m as calculated from the following:   Height as of 03/29/19: 5' 7.5" (1.715 m).   Weight as of this encounter: 97.9 kg.   ABG: No results found for: PHART, PCO2ART, PO2ART, HCO3, TCO2, ACIDBASEDEF, O2SAT  Vent Settings: N/A  Condition -Stable  Family Communication  : Daughter-Benita Horn-[phone] updated over the phone 1/24  Code Status :  Full Code  Diet :  Diet Order            Diet Heart Room service appropriate? Yes; Fluid consistency: Thin  Diet effective now                  Disposition Plan  :   Status is: Inpatient  Remains inpatient appropriate because:Inpatient level of care appropriate due to severity of illness   Dispo: The patient is from: Home              Anticipated d/c is to: TBD              Anticipated d/c date is: > 3 days              Patient currently is not medically stable to d/c.   Difficult to place patient No   Barriers to discharge: Hypoxia requiring O2 supplementation/complete 5 days of IV  Remdesivir  Antimicorbials  :    Anti-infectives (From admission, onward)   Start     Dose/Rate Route Frequency Ordered Stop   07/08/20 1000  remdesivir 100 mg in sodium chloride 0.9 % 100 mL IVPB       "Followed by" Linked Group Details   100 mg 200 mL/hr over 30 Minutes Intravenous Daily 07/07/20 1031 07/12/20 0959   07/07/20 1300  remdesivir 200 mg in sodium chloride 0.9% 250 mL IVPB       "Followed by" Linked Group Details   200 mg 580 mL/hr over 30 Minutes Intravenous Once 07/07/20 1031 07/07/20 1629      Inpatient Medications  Scheduled Meds: . albuterol  2 puff Inhalation Q6H  . vitamin C  500 mg Oral Daily  . colchicine  0.6 mg Oral Daily  . famotidine  20 mg Oral BID  . febuxostat  40 mg Oral Daily  . hydrALAZINE  100 mg Oral TID  . isosorbide dinitrate  30 mg Oral TID  . methylPREDNISolone (SOLU-MEDROL) injection  0.5 mg/kg Intravenous Q12H   Followed by  . [START ON 07/10/2020] predniSONE  50 mg Oral Daily  . pindolol  5 mg Oral BID  . potassium chloride SA  20 mEq Oral Daily  . Rivaroxaban  15 mg Oral Q supper  . rosuvastatin  20 mg Oral QPM  . sodium chloride flush  3 mL Intravenous Q12H  . torsemide  20 mg Oral BID  . zinc sulfate  220 mg Oral Daily   Continuous Infusions: . remdesivir 100 mg in NS 100 mL 100 mg (07/08/20 0937)   PRN Meds:.chlorpheniramine-HYDROcodone, guaiFENesin-dextromethorphan, ondansetron **OR** ondansetron (ZOFRAN) IV   Time Spent in minutes  25  See all Orders from today for further details   Oren Binet M.D on 07/08/2020 at  10:28 AM  To page go to [email or web address] - use universal password  [facility] -  Office  [phone]    Objective:   Vitals:   07/07/20 2200 07/07/20 2351 07/08/20 0430 07/08/20 0700  BP: (!) 156/80 120/67 131/67 (!) 153/84  Pulse: 85 72 76 74  Resp: (!) 25 19 19 20   Temp: 98 F (36.7 C) (!) 97.5 F (36.4 C) 97.7 F (36.5 C)   TempSrc: Oral Oral Oral Oral  SpO2: 99% 98% 96% 93%   Weight:        Wt Readings from Last 3 Encounters:  07/07/20 97.9 kg  03/29/19 99.2 kg  12/20/18 95.7 kg     Intake/Output Summary (Last 24 hours) at 07/08/2020 1028 Last data filed at 07/08/2020 9381 Gross per 24 hour  Intake 370 ml  Output 700 ml  Net -330 ml     Physical Exam Gen Exam:Alert awake-not in any distress HEENT:atraumatic, normocephalic Chest: B/L clear to auscultation anteriorly CVS:S1S2 regular Abdomen:soft non tender, non distended Extremities:no edema Neurology: Non focal Skin: no rash   Data Review:    CBC Recent Labs  Lab 07/07/20 0834 07/08/20 0108  WBC 4.4 4.0  HGB 13.5 12.0  HCT 42.2 36.2  PLT 235 243  MCV 99.3 96.3  MCH 31.8 31.9  MCHC 32.0 33.1  RDW 14.5 14.3  LYMPHSABS 0.8 0.6*  MONOABS 0.3 0.1  EOSABS 0.2 0.0  BASOSABS 0.0 0.0    Chemistries  Recent Labs  Lab 07/07/20 0834 07/08/20 0108  NA 139 140  K 4.0 4.1  CL 106 108  CO2 23 23  GLUCOSE 131* 119*  BUN 9 15  CREATININE 1.09* 1.16*  CALCIUM 8.9 8.6*  MG  --  2.0  AST 25 21  ALT 15 16  ALKPHOS 61 58  BILITOT 1.2 1.0   ------------------------------------------------------------------------------------------------------------------ Recent Labs    07/07/20 1058  TRIG 91    Lab Results  Component Value Date   HGBA1C 5.5 09/24/2017   ------------------------------------------------------------------------------------------------------------------ No results for input(s): TSH, T4TOTAL, T3FREE, THYROIDAB in the last 72 hours.  Invalid input(s): FREET3 ------------------------------------------------------------------------------------------------------------------ Recent Labs    07/07/20 2223 07/08/20 0108  FERRITIN 134 137    Coagulation profile No results for input(s): INR, PROTIME in the last 168 hours.  Recent Labs    07/07/20 2223 07/08/20 0108  DDIMER 3.92* 3.81*    Cardiac Enzymes No results for input(s): CKMB, TROPONINI, MYOGLOBIN in  the last 168 hours.  Invalid input(s): CK ------------------------------------------------------------------------------------------------------------------    Component Value Date/Time   BNP 370.7 (H) 07/07/2020 8299    Micro Results Recent Results (from the past 240 hour(s))  SARS Coronavirus 2 by RT PCR (hospital order, performed in [facility] hospital lab) Nasopharyngeal Nasopharyngeal Swab     Status: Abnormal   Collection Time: 07/07/20  8:16 AM   Specimen: Nasopharyngeal Swab  Result Value Ref Range Status   SARS Coronavirus 2 POSITIVE (A) NEGATIVE Final    Comment: RESULT CALLED TO, READ BACK BY AND VERIFIED WITH: RN Richarda Blade [phone] MLM (NOTE) SARS-CoV-2 target nucleic acids are DETECTED  SARS-CoV-2 RNA is generally detectable in upper respiratory specimens  during the acute phase of infection.  Positive results are indicative  of the presence of the identified virus, but do not rule out bacterial infection or co-infection with other pathogens not detected by the test.  Clinical correlation with patient history and  other diagnostic information is necessary to determine patient infection status.  The expected result  is negative.  Fact Sheet for Patients:   StrictlyIdeas.no   Fact Sheet for Healthcare Providers:   BankingDealers.co.za    This test is not yet approved or cleared by the [place] FDA and  has been authorized for detection and/or diagnosis of SARS-CoV-2 by FDA under an Emergency Use Authorization (EUA).  This EUA will remain in effect (meaning this test can b e used) for the duration of  the COVID-19 declaration under Section 564(b)(1) of the Act, 21 U.S.C. section 360-bbb-3(b)(1), unless the authorization is terminated or revoked sooner.  Performed at [facility], [address] [place], [place] [address]     Radiology Reports DG Chest Portable 1 View  Result Date:  07/07/2020 CLINICAL DATA:  Shortness of breath. EXAM: PORTABLE CHEST 1 VIEW COMPARISON:  March 11, 2018 FINDINGS: The mediastinal contour and cardiac silhouette are stable. The heart size enlarged. Patchy consolidation of bilateral lung bases are noted. There is no pulmonary edema. Degenerative joint changes of bilateral shoulders are noted. IMPRESSION: Bilateral lung base pneumonias. Electronically Signed   By: Abelardo Diesel M.D.   On: 07/07/2020 08:47

## 2020-07-08 NOTE — CV Procedure (Signed)
BLE venous duplex completed  Results can be found under chart review under CV PROC. 07/08/2020 7:07 PM Xiadani Damman RVT, RDMS

## 2020-07-08 NOTE — Plan of Care (Signed)
  Problem: Education: Goal: Knowledge of General Education information will improve Description: Including pain rating scale, medication(s)/side effects and non-pharmacologic comfort measures Outcome: Progressing   Problem: Health Behavior/Discharge Planning: Goal: Ability to manage health-related needs will improve Outcome: Progressing   Problem: Clinical Measurements: Goal: Ability to maintain clinical measurements within normal limits will improve Outcome: Progressing Goal: Will remain free from infection Outcome: Progressing Goal: Diagnostic test results will improve Outcome: Progressing Goal: Respiratory complications will improve Outcome: Progressing Goal: Cardiovascular complication will be avoided Outcome: Progressing   Problem: Activity: Goal: Risk for activity intolerance will decrease Outcome: Progressing   Problem: Nutrition: Goal: Adequate nutrition will be maintained Outcome: Progressing   Problem: Coping: Goal: Level of anxiety will decrease Outcome: Progressing   Problem: Elimination: Goal: Will not experience complications related to bowel motility Outcome: Progressing Goal: Will not experience complications related to urinary retention Outcome: Progressing   Problem: Pain Managment: Goal: General experience of comfort will improve Outcome: Progressing   Problem: Safety: Goal: Ability to remain free from injury will improve Outcome: Progressing   Problem: Skin Integrity: Goal: Risk for impaired skin integrity will decrease Outcome: Progressing   Problem: Education: Goal: Knowledge of General Education information will improve Description: Including pain rating scale, medication(s)/side effects and non-pharmacologic comfort measures Outcome: Progressing   Problem: Health Behavior/Discharge Planning: Goal: Ability to manage health-related needs will improve Outcome: Progressing   Problem: Clinical Measurements: Goal: Ability to maintain  clinical measurements within normal limits will improve Outcome: Progressing Goal: Will remain free from infection Outcome: Progressing Goal: Diagnostic test results will improve Outcome: Progressing Goal: Respiratory complications will improve Outcome: Progressing Goal: Cardiovascular complication will be avoided Outcome: Progressing   Problem: Activity: Goal: Risk for activity intolerance will decrease Outcome: Progressing   Problem: Nutrition: Goal: Adequate nutrition will be maintained Outcome: Progressing   Problem: Coping: Goal: Level of anxiety will decrease Outcome: Progressing   Problem: Elimination: Goal: Will not experience complications related to bowel motility Outcome: Progressing Goal: Will not experience complications related to urinary retention Outcome: Progressing   Problem: Pain Managment: Goal: General experience of comfort will improve Outcome: Progressing   Problem: Safety: Goal: Ability to remain free from injury will improve Outcome: Progressing   Problem: Skin Integrity: Goal: Risk for impaired skin integrity will decrease Outcome: Progressing   Problem: Education: Goal: Knowledge of risk factors and measures for prevention of condition will improve Outcome: Progressing   Problem: Coping: Goal: Psychosocial and spiritual needs will be supported Outcome: Progressing   Problem: Respiratory: Goal: Will maintain a patent airway Outcome: Progressing Goal: Complications related to the disease process, condition or treatment will be avoided or minimized Outcome: Progressing   

## 2020-07-09 LAB — COMPREHENSIVE METABOLIC PANEL
ALT: 15 U/L (ref 0–44)
AST: 20 U/L (ref 15–41)
Albumin: 2.9 g/dL — ABNORMAL LOW (ref 3.5–5.0)
Alkaline Phosphatase: 60 U/L (ref 38–126)
Anion gap: 10 (ref 5–15)
BUN: 33 mg/dL — ABNORMAL HIGH (ref 8–23)
CO2: 23 mmol/L (ref 22–32)
Calcium: 8.5 mg/dL — ABNORMAL LOW (ref 8.9–10.3)
Chloride: 105 mmol/L (ref 98–111)
Creatinine, Ser: 1.78 mg/dL — ABNORMAL HIGH (ref 0.44–1.00)
GFR, Estimated: 27 mL/min — ABNORMAL LOW (ref >60)
Glucose, Bld: 170 mg/dL — ABNORMAL HIGH (ref 70–99)
Potassium: 3.8 mmol/L (ref 3.5–5.1)
Sodium: 138 mmol/L (ref 135–145)
Total Bilirubin: 1.1 mg/dL (ref 0.3–1.2)
Total Protein: 6.9 g/dL (ref 6.5–8.1)

## 2020-07-09 LAB — CBC WITH DIFFERENTIAL/PLATELET
Abs Immature Granulocytes: 0.07 10*3/uL (ref 0.00–0.07)
Basophils Absolute: 0 10*3/uL (ref 0.0–0.1)
Basophils Relative: 0 %
Eosinophils Absolute: 0 10*3/uL (ref 0.0–0.5)
Eosinophils Relative: 0 %
HCT: 37.9 % (ref 36.0–46.0)
Hemoglobin: 12.5 g/dL (ref 12.0–15.0)
Immature Granulocytes: 1 %
Lymphocytes Relative: 7 %
Lymphs Abs: 0.6 10*3/uL — ABNORMAL LOW (ref 0.7–4.0)
MCH: 31.9 pg (ref 26.0–34.0)
MCHC: 33 g/dL (ref 30.0–36.0)
MCV: 96.7 fL (ref 80.0–100.0)
Monocytes Absolute: 0.1 10*3/uL (ref 0.1–1.0)
Monocytes Relative: 1 %
Neutro Abs: 7 10*3/uL (ref 1.7–7.7)
Neutrophils Relative %: 91 %
Platelets: 299 10*3/uL (ref 150–400)
RBC: 3.92 MIL/uL (ref 3.87–5.11)
RDW: 14.6 % (ref 11.5–15.5)
WBC: 7.8 10*3/uL (ref 4.0–10.5)
nRBC: 0 % (ref 0.0–0.2)

## 2020-07-09 LAB — D-DIMER, QUANTITATIVE: D-Dimer, Quant: 3.73 ug/mL-FEU — ABNORMAL HIGH (ref 0.00–0.50)

## 2020-07-09 LAB — C-REACTIVE PROTEIN: CRP: 1.8 mg/dL — ABNORMAL HIGH (ref <1.0)

## 2020-07-09 LAB — FERRITIN: Ferritin: 128 ng/mL (ref 11–307)

## 2020-07-09 LAB — MAGNESIUM: Magnesium: 1.9 mg/dL (ref 1.7–2.4)

## 2020-07-09 LAB — PHOSPHORUS: Phosphorus: 3.6 mg/dL (ref 2.5–4.6)

## 2020-07-09 MED ORDER — SODIUM CHLORIDE 0.9 % IV SOLN: INTRAVENOUS | Status: AC

## 2020-07-09 MED ORDER — MAGNESIUM SULFATE 2 GM/50ML IV SOLN
2.0000 g | Freq: Once | INTRAVENOUS | Status: AC
  Administered 2020-07-09: 2 g via INTRAVENOUS
  Filled 2020-07-09: qty 50

## 2020-07-09 NOTE — Plan of Care (Signed)
  Problem: Education: Goal: Knowledge of General Education information will improve Description: Including pain rating scale, medication(s)/side effects and non-pharmacologic comfort measures Outcome: Progressing   Problem: Health Behavior/Discharge Planning: Goal: Ability to manage health-related needs will improve Outcome: Progressing   Problem: Clinical Measurements: Goal: Ability to maintain clinical measurements within normal limits will improve Outcome: Progressing Goal: Will remain free from infection Outcome: Progressing Goal: Diagnostic test results will improve Outcome: Progressing Goal: Respiratory complications will improve Outcome: Progressing Goal: Cardiovascular complication will be avoided Outcome: Progressing   Problem: Activity: Goal: Risk for activity intolerance will decrease Outcome: Progressing   Problem: Nutrition: Goal: Adequate nutrition will be maintained Outcome: Progressing   Problem: Coping: Goal: Level of anxiety will decrease Outcome: Progressing   Problem: Elimination: Goal: Will not experience complications related to bowel motility Outcome: Progressing Goal: Will not experience complications related to urinary retention Outcome: Progressing   Problem: Pain Managment: Goal: General experience of comfort will improve Outcome: Progressing   Problem: Safety: Goal: Ability to remain free from injury will improve Outcome: Progressing   Problem: Skin Integrity: Goal: Risk for impaired skin integrity will decrease Outcome: Progressing   Problem: Education: Goal: Knowledge of General Education information will improve Description: Including pain rating scale, medication(s)/side effects and non-pharmacologic comfort measures Outcome: Progressing   Problem: Health Behavior/Discharge Planning: Goal: Ability to manage health-related needs will improve Outcome: Progressing   Problem: Clinical Measurements: Goal: Ability to maintain  clinical measurements within normal limits will improve Outcome: Progressing Goal: Will remain free from infection Outcome: Progressing Goal: Diagnostic test results will improve Outcome: Progressing Goal: Respiratory complications will improve Outcome: Progressing Goal: Cardiovascular complication will be avoided Outcome: Progressing   Problem: Activity: Goal: Risk for activity intolerance will decrease Outcome: Progressing   Problem: Nutrition: Goal: Adequate nutrition will be maintained Outcome: Progressing   Problem: Coping: Goal: Level of anxiety will decrease Outcome: Progressing   Problem: Elimination: Goal: Will not experience complications related to bowel motility Outcome: Progressing Goal: Will not experience complications related to urinary retention Outcome: Progressing   Problem: Pain Managment: Goal: General experience of comfort will improve Outcome: Progressing   Problem: Safety: Goal: Ability to remain free from injury will improve Outcome: Progressing   Problem: Skin Integrity: Goal: Risk for impaired skin integrity will decrease Outcome: Progressing   Problem: Education: Goal: Knowledge of risk factors and measures for prevention of condition will improve Outcome: Progressing   Problem: Coping: Goal: Psychosocial and spiritual needs will be supported Outcome: Progressing   Problem: Respiratory: Goal: Will maintain a patent airway Outcome: Progressing Goal: Complications related to the disease process, condition or treatment will be avoided or minimized Outcome: Progressing   

## 2020-07-09 NOTE — Progress Notes (Signed)
PROGRESS NOTE                                                                                                                                                                                                             Patient Demographics:    Shannon [name], is a 87 y.o. female, DOB - 1931/05/22, KDT:[id number]  Outpatient Primary MD for the patient is [name], MD   Admit date - 07/07/2020   LOS - 2  Chief Complaint  Patient presents with  . Shortness of Breath       Brief Narrative: Patient is a 87 y.o. female with PMHx of chronic diastolic heart failure, atrial fibrillation on anticoagulation, HTN, CKD stage IIIb, HLD, gout,-who presented with shortness of breath-found to have acute hypoxic respiratory failure due to COVID-19 pneumonia.    Per patient-her spouse is also in the hospital due to COVID-19 pneumonia.  COVID-19 vaccinated status: Vaccinated-but not boosted.  Significant Events: 1/23>> Admit to [facility] ([place] for hypoxia due to COVID-19 pneumonia  Significant studies: 1/23>>Chest x-ray: Bilateral lung infiltrates 1/24>> bilateral lower extremity Doppler: No DVT  COVID-19 medications: Steroids:1/23>> Remdesivir:1/23>>  Antibiotics: None  Microbiology data: 1/23>>Blood culture:pending  Procedures: None  Consults: None  DVT prophylaxis: Rivaroxaban (XARELTO) tablet 15 mg     Subjective:   Remains on room air-no major issues overnight-continues to complain of weakness.   Assessment  & Plan :   Acute Hypoxic Resp Failure due to Covid 19 Viral pneumonia: Improved-on room air-remains on steroids/Remdesivir-CRP downtrending-awaiting mobilization with PT to see how she does.   Fever: afebrile O2 requirements:  SpO2: 98 % O2 Flow Rate (L/min): 2 L/min   COVID-19 Labs: Recent Labs    07/07/20 1058 07/07/20 2223 07/08/20 0108 07/09/20 0127  DDIMER 3.87* 3.92* 3.81* 3.73*  FERRITIN 131 134 137  128  LDH 242* 245*  --   --   CRP 3.0* 2.3* 2.2* 1.8*       Component Value Date/Time   BNP 370.7 (H) 07/07/2020 0834    Recent Labs  Lab 07/07/20 1058 07/07/20 2223  PROCALCITON <0.10 <0.10    Lab Results  Component Value Date   SARSCOV2NAA POSITIVE (A) 07/07/2020     Prone/Incentive Spirometry: encouraged  incentive spirometry use 3-4/hour.  Elevated D-dimer: Likely due to COVID-19 related inflammation-on chronic anticoagulation with Xarelto-lower extremity Doppler negative  for DVT-since on Xarelto chronically-doubt further work-up required.    AKI on CKD stage IIIb: Mild bump in creatinine this morning-hold Demadex-gently hydrate for a few hours-reassess tomorrow.  Volume status remained stable.  PAF: Maintaining sinus rhythm-continue pindolol-on Xarelto  Minimally elevated troponin: Due to demand ischemia-doubt ACS.  Chronic diastolic heart failure: Euvolemic on exam-continue torsemide.  HTN: BP stable-continue hydralazine, pindolol, Imdur and Demadex.  HLD: Continue Crestor  History of gout: No flare-continue colchicine and Uloric  Obesity: Estimated body mass index is 33.3 kg/m as calculated from the following:   Height as of 03/29/19: 5' 7.5" (1.715 m).   Weight as of this encounter: 97.9 kg.   ABG: No results found for: PHART, PCO2ART, PO2ART, HCO3, TCO2, ACIDBASEDEF, O2SAT  Vent Settings: N/A  Condition -Stable  Family Communication  : Daughter-Benita Horn-[phone] updated over the phone 1/25  Code Status :  Full Code  Diet :  Diet Order            Diet Heart Room service appropriate? Yes; Fluid consistency: Thin  Diet effective now                  Disposition Plan  :   Status is: Inpatient  Remains inpatient appropriate because:Inpatient level of care appropriate due to severity of illness   Dispo: The patient is from: Home              Anticipated d/c is to: TBD              Anticipated d/c date is: > 3 days               Patient currently is not medically stable to d/c.   Difficult to place patient No   Barriers to discharge: Hypoxia requiring O2 supplementation/complete 5 days of IV Remdesivir  Antimicorbials  :    Anti-infectives (From admission, onward)   Start     Dose/Rate Route Frequency Ordered Stop   07/08/20 1000  remdesivir 100 mg in sodium chloride 0.9 % 100 mL IVPB       "Followed by" Linked Group Details   100 mg 200 mL/hr over 30 Minutes Intravenous Daily 07/07/20 1031 07/12/20 0959   07/07/20 1300  remdesivir 200 mg in sodium chloride 0.9% 250 mL IVPB       "Followed by" Linked Group Details   200 mg 580 mL/hr over 30 Minutes Intravenous Once 07/07/20 1031 07/07/20 1629      Inpatient Medications  Scheduled Meds: . albuterol  2 puff Inhalation Q6H  . vitamin C  500 mg Oral Daily  . colchicine  0.6 mg Oral Daily  . famotidine  20 mg Oral BID  . febuxostat  40 mg Oral Daily  . hydrALAZINE  100 mg Oral TID  . isosorbide dinitrate  30 mg Oral TID  . methylPREDNISolone (SOLU-MEDROL) injection  0.5 mg/kg Intravenous Q12H   Followed by  . [START ON 07/10/2020] predniSONE  50 mg Oral Daily  . pindolol  5 mg Oral BID  . potassium chloride SA  20 mEq Oral Daily  . Rivaroxaban  15 mg Oral Q supper  . rosuvastatin  20 mg Oral QPM  . sodium chloride flush  3 mL Intravenous Q12H  . zinc sulfate  220 mg Oral Daily   Continuous Infusions: . remdesivir 100 mg in NS 100 mL Stopped (07/09/20 0952)   PRN Meds:.chlorpheniramine-HYDROcodone, guaiFENesin-dextromethorphan, ondansetron **OR** ondansetron (ZOFRAN) IV   Time Spent in minutes  25  See  all Orders from today for further details   Oren Binet M.D on 07/09/2020 at 11:38 AM  To page go to [email or web address] - use universal password  [facility] -  Office  [phone]    Objective:   Vitals:   07/08/20 1100 07/08/20 1500 07/08/20 2052 07/09/20 0423  BP: 112/60 (!) 149/77 140/72 (!) 152/77  Pulse: 74 88 77 79  Resp:  20 20 18 18   Temp: 97.8 F (36.6 C) 98 F (36.7 C) (!) 97.4 F (36.3 C) 97.9 F (36.6 C)  TempSrc: Oral Oral Oral Oral  SpO2: 100% 100% 96% 98%  Weight:        Wt Readings from Last 3 Encounters:  07/07/20 97.9 kg  03/29/19 99.2 kg  12/20/18 95.7 kg     Intake/Output Summary (Last 24 hours) at 07/09/2020 1138 Last data filed at 07/09/2020 0930 Gross per 24 hour  Intake 460 ml  Output 1150 ml  Net -690 ml     Physical Exam Gen Exam:Alert awake-not in any distress HEENT:atraumatic, normocephalic Chest: B/L clear to auscultation anteriorly CVS:S1S2 regular Abdomen:soft non tender, non distended Extremities:no edema Neurology: Non focal Skin: no rash  Data Review:    CBC Recent Labs  Lab 07/07/20 0834 07/08/20 0108 07/09/20 0127  WBC 4.4 4.0 7.8  HGB 13.5 12.0 12.5  HCT 42.2 36.2 37.9  PLT 235 243 299  MCV 99.3 96.3 96.7  MCH 31.8 31.9 31.9  MCHC 32.0 33.1 33.0  RDW 14.5 14.3 14.6  LYMPHSABS 0.8 0.6* 0.6*  MONOABS 0.3 0.1 0.1  EOSABS 0.2 0.0 0.0  BASOSABS 0.0 0.0 0.0    Chemistries  Recent Labs  Lab 07/07/20 0834 07/08/20 0108 07/09/20 0127  NA 139 140 138  K 4.0 4.1 3.8  CL 106 108 105  CO2 23 23 23   GLUCOSE 131* 119* 170*  BUN 9 15 33*  CREATININE 1.09* 1.16* 1.78*  CALCIUM 8.9 8.6* 8.5*  MG  --  2.0 1.9  AST 25 21 20   ALT 15 16 15   ALKPHOS 61 58 60  BILITOT 1.2 1.0 1.1   ------------------------------------------------------------------------------------------------------------------ Recent Labs    07/07/20 1058  TRIG 91    Lab Results  Component Value Date   HGBA1C 5.5 09/24/2017   ------------------------------------------------------------------------------------------------------------------ No results for input(s): TSH, T4TOTAL, T3FREE, THYROIDAB in the last 72 hours.  Invalid input(s): FREET3 ------------------------------------------------------------------------------------------------------------------ Recent Labs     07/08/20 0108 07/09/20 0127  FERRITIN 137 128    Coagulation profile No results for input(s): INR, PROTIME in the last 168 hours.  Recent Labs    07/08/20 0108 07/09/20 0127  DDIMER 3.81* 3.73*    Cardiac Enzymes No results for input(s): CKMB, TROPONINI, MYOGLOBIN in the last 168 hours.  Invalid input(s): CK ------------------------------------------------------------------------------------------------------------------    Component Value Date/Time   BNP 370.7 (H) 07/07/2020 7616    Micro Results Recent Results (from the past 240 hour(s))  SARS Coronavirus 2 by RT PCR (hospital order, performed in [facility] hospital lab) Nasopharyngeal Nasopharyngeal Swab     Status: Abnormal   Collection Time: 07/07/20  8:16 AM   Specimen: Nasopharyngeal Swab  Result Value Ref Range Status   SARS Coronavirus 2 POSITIVE (A) NEGATIVE Final    Comment: RESULT CALLED TO, READ BACK BY AND VERIFIED WITH: RN Richarda Blade [phone] MLM (NOTE) SARS-CoV-2 target nucleic acids are DETECTED  SARS-CoV-2 RNA is generally detectable in upper respiratory specimens  during the acute phase of infection.  Positive results are indicative  of  the presence of the identified virus, but do not rule out bacterial infection or co-infection with other pathogens not detected by the test.  Clinical correlation with patient history and  other diagnostic information is necessary to determine patient infection status.  The expected result is negative.  Fact Sheet for Patients:   StrictlyIdeas.no   Fact Sheet for Healthcare Providers:   BankingDealers.co.za    This test is not yet approved or cleared by the [place] FDA and  has been authorized for detection and/or diagnosis of SARS-CoV-2 by FDA under an Emergency Use Authorization (EUA).  This EUA will remain in effect (meaning this test can b e used) for the duration of  the COVID-19 declaration under  Section 564(b)(1) of the Act, 21 U.S.C. section 360-bbb-3(b)(1), unless the authorization is terminated or revoked sooner.  Performed at [facility], [place] [address], [place], [place] [address]   Blood Culture (routine x 2)     Status: None (Preliminary result)   Collection Time: 07/07/20 10:19 AM   Specimen: BLOOD  Result Value Ref Range Status   Specimen Description BLOOD BLOOD RIGHT FOREARM  Final   Special Requests   Final    BOTTLES DRAWN AEROBIC AND ANAEROBIC Blood Culture results may not be optimal due to an inadequate volume of blood received in culture bottles   Culture   Final    NO GROWTH 1 DAY Performed at [facility], [place] [address], [place], [place] [address]    Report Status PENDING  Incomplete  Blood Culture (routine x 2)     Status: None (Preliminary result)   Collection Time: 07/07/20 10:24 AM   Specimen: BLOOD  Result Value Ref Range Status   Specimen Description BLOOD LEFT ANTECUBITAL  Final   Special Requests   Final    BOTTLES DRAWN AEROBIC AND ANAEROBIC Blood Culture adequate volume   Culture   Final    NO GROWTH 1 DAY Performed at [facility], [place] [address] [place], [place] [address]    Report Status PENDING  Incomplete    Radiology Reports DG Chest Portable 1 View  Result Date: 07/07/2020 CLINICAL DATA:  Shortness of breath. EXAM: PORTABLE CHEST 1 VIEW COMPARISON:  March 11, 2018 FINDINGS: The mediastinal contour and cardiac silhouette are stable. The heart size enlarged. Patchy consolidation of bilateral lung bases are noted. There is no pulmonary edema. Degenerative joint changes of bilateral shoulders are noted. IMPRESSION: Bilateral lung base pneumonias. Electronically Signed   By: Abelardo Diesel M.D.   On: 07/07/2020 08:47   VAS [place] LOWER EXTREMITY VENOUS (DVT)  Result Date: 07/09/2020  Lower Venous DVT Study Other Indications: Swelling, hypoxia, elevated d-dimer, and covid+. Limitations: Body habitus, poor  ultrasound/tissue interface and Patient inability to cooperate. Comparison Study: No prior exams Performing Technologist: Rogelia Rohrer  Examination Guidelines: A complete evaluation includes B-mode imaging, spectral Doppler, color Doppler, and power Doppler as needed of all accessible portions of each vessel. Bilateral testing is considered an integral part of a complete examination. Limited examinations for reoccurring indications may be performed as noted. The reflux portion of the exam is performed with the patient in reverse Trendelenburg.  +---------+---------------+---------+-----------+----------+--------------+ RIGHT    CompressibilityPhasicitySpontaneityPropertiesThrombus Aging +---------+---------------+---------+-----------+----------+--------------+ CFV      Full           Yes      Yes                                 +---------+---------------+---------+-----------+----------+--------------+  SFJ      Full                                                        +---------+---------------+---------+-----------+----------+--------------+ FV Prox  Full           Yes      Yes                                 +---------+---------------+---------+-----------+----------+--------------+ FV Mid   Full           Yes      Yes                                 +---------+---------------+---------+-----------+----------+--------------+ FV DistalFull           Yes      Yes                                 +---------+---------------+---------+-----------+----------+--------------+ PFV      Full                                                        +---------+---------------+---------+-----------+----------+--------------+ POP      Full           Yes      Yes                                 +---------+---------------+---------+-----------+----------+--------------+ PTV      Full                                                         +---------+---------------+---------+-----------+----------+--------------+ PERO     Full                                                        +---------+---------------+---------+-----------+----------+--------------+   +---------+---------------+---------+-----------+----------+--------------+ LEFT     CompressibilityPhasicitySpontaneityPropertiesThrombus Aging +---------+---------------+---------+-----------+----------+--------------+ CFV      Full           Yes      Yes                                 +---------+---------------+---------+-----------+----------+--------------+ SFJ      Full                                                        +---------+---------------+---------+-----------+----------+--------------+  FV Prox  Full           Yes      Yes                                 +---------+---------------+---------+-----------+----------+--------------+ FV Mid   Full           Yes      Yes                                 +---------+---------------+---------+-----------+----------+--------------+ FV DistalFull           Yes      Yes                                 +---------+---------------+---------+-----------+----------+--------------+ PFV      Full                                                        +---------+---------------+---------+-----------+----------+--------------+ POP      Full           Yes      Yes                                 +---------+---------------+---------+-----------+----------+--------------+ PTV      Full                                                        +---------+---------------+---------+-----------+----------+--------------+ PERO     Full                                                        +---------+---------------+---------+-----------+----------+--------------+     Summary: BILATERAL: - No evidence of deep vein thrombosis seen in the lower extremities, bilaterally. -No evidence of  popliteal cyst, bilaterally.   *See table(s) above for measurements and observations. Electronically signed by Deitra Mayo MD on 07/09/2020 at 7:48:05 AM.    Final

## 2020-07-09 NOTE — Evaluation (Signed)
Physical Therapy Evaluation Patient Details Name: Shannon [name] MRN: [id number] DOB: 15-Aug-1930 Today's Date: 07/09/2020   History of Present Illness  87 y.o. female with medical history significant of chronic diastolic CHF, A. fib, HTN, HLD, CKD stage III, and gout presents with complaints of worsening shortness of breath over the last day.  Husband tested positive for COVID-19 07/01/2020 and is currently hospitalized here at [facility]. In ED noted to be afebrile, pulse 94-1 07, respiration 20-25, blood pressures elevated up to 183/116, and O2 saturations noted as low as 90% on room air and was placed on 2 L nasal cannula oxygen with improvement to 94-95%.  COVID-19 screening was positive.  Chest x-ray revealed bilateral lung base pneumonias. Admitted for COVID PNA 07/07/20.  Clinical Impression  PTA pt living with husband and granddaughter in single story home with level entry. Pt reports her husband is currently in the hospital for [place] also. Pt reports granddaughter works during the day. Pt was independent with mobility, bathing and dressing with exception of socks which her husband helps with, and does the cooking, cleaning and shopping. Pt is currently limited in safe mobility by decreased endurance and strength. PT recommends HHPT and HHAide or short term support for her and her husband when they first get home from the hospital. PT will continue to follow acutely.     Follow Up Recommendations Home health PT;Supervision - Intermittent;Other (comment) (Home First or similar program)    Equipment Recommendations  None recommended by PT (pt has necessary equipment)       Precautions / Restrictions Precautions Precautions: Fall Restrictions Weight Bearing Restrictions: No      Mobility  Bed Mobility               General bed mobility comments: sitting up in recliner    Transfers Overall transfer level: Needs assistance Equipment used: None Transfers: Sit to/from  Stand Sit to Stand: Min guard         General transfer comment: min guard for safety with power up and self steadying prior to reaching to RW  Ambulation/Gait Ambulation/Gait assistance: Min assist Gait Distance (Feet): 40 Feet Assistive device: Rolling walker (2 wheeled) Gait Pattern/deviations: Step-through pattern;Decreased step length - right;Decreased step length - left;Trunk flexed Gait velocity: slowed Gait velocity interpretation: 1.31 - 2.62 ft/sec, indicative of limited community ambulator General Gait Details: light min A for safety with slightly unsteady gait, using RW, vc for proximity to RW      Balance Overall balance assessment: Mild deficits observed, not formally tested;Needs assistance Sitting-balance support: Feet supported;No upper extremity supported Sitting balance-Leahy Scale: Good     Standing balance support: No upper extremity supported Standing balance-Leahy Scale: Fair Standing balance comment: dynamic balance benefits from UE assist                             Pertinent Vitals/Pain Pain Assessment: No/denies pain    Home Living Family/patient expects to be discharged to:: Private residence Living Arrangements: Spouse/significant other;Other relatives (granddaughter) Available Help at Discharge: Family;Available 24 hours/day Type of Home: House Home Access: Level entry     Home Layout: One level Home Equipment: Walker - 2 wheels;Bedside commode;Hand held shower head;Cane - single point      Prior Function Level of Independence: Needs assistance   Gait / Transfers Assistance Needed: uses cane for ambulation for limited community ambulation  ADL's / Homemaking Assistance Needed: needs assist with socks  Extremity/Trunk Assessment   Upper Extremity Assessment Upper Extremity Assessment: Generalized weakness    Lower Extremity Assessment Lower Extremity Assessment: Generalized weakness       Communication    Communication: No difficulties  Cognition Arousal/Alertness: Awake/alert Behavior During Therapy: WFL for tasks assessed/performed Overall Cognitive Status: Within Functional Limits for tasks assessed                                        General Comments General comments (skin integrity, edema, etc.): HR 76-123 with ambulation SaO2 on RA >93%O2 with ambulation        Assessment/Plan    PT Assessment Patient needs continued PT services  PT Problem List Decreased activity tolerance;Decreased mobility;Cardiopulmonary status limiting activity       PT Treatment Interventions Gait training;DME instruction;Functional mobility training;Therapeutic activities;Therapeutic exercise;Balance training;Cognitive remediation;Patient/family education    PT Goals (Current goals can be found in the Care Plan section)  Acute Rehab PT Goals Patient Stated Goal: go home with husband PT Goal Formulation: With patient Time For Goal Achievement: 07/24/20 Potential to Achieve Goals: Good    Frequency Min 3X/week   Barriers to discharge Decreased caregiver support         AM-PAC PT "6 Clicks" Mobility  Outcome Measure Help needed turning from your back to your side while in a flat bed without using bedrails?: None Help needed moving from lying on your back to sitting on the side of a flat bed without using bedrails?: None Help needed moving to and from a bed to a chair (including a wheelchair)?: None Help needed standing up from a chair using your arms (e.g., wheelchair or bedside chair)?: None Help needed to walk in hospital room?: A Little Help needed climbing 3-5 steps with a railing? : A Little 6 Click Score: 22    End of Session Equipment Utilized During Treatment: Gait belt Activity Tolerance: Patient tolerated treatment well Patient left: in chair;with call bell/phone within reach Nurse Communication: Mobility status PT Visit Diagnosis: Other abnormalities of gait  and mobility (R26.89);Muscle weakness (generalized) (M62.81);Difficulty in walking, not elsewhere classified (R26.2)    Time: 7741-2878 PT Time Calculation (min) (ACUTE ONLY): 23 min   Charges:   PT Evaluation $PT Eval Moderate Complexity: 1 Mod PT Treatments $Gait Training: 8-22 mins        Kemoni Ortega B. Migdalia Dk PT, DPT Acute Rehabilitation Services Pager [phone] Office [phone]   [place] 07/09/2020, 2:40 PM

## 2020-07-10 ENCOUNTER — Inpatient Hospital Stay (HOSPITAL_COMMUNITY): Payer: Medicare Other

## 2020-07-10 LAB — URINALYSIS, ROUTINE W REFLEX MICROSCOPIC
Bilirubin Urine: NEGATIVE
Glucose, UA: NEGATIVE mg/dL
Hgb urine dipstick: NEGATIVE
Ketones, ur: NEGATIVE mg/dL
Nitrite: NEGATIVE
Protein, ur: NEGATIVE mg/dL
Specific Gravity, Urine: 1.016 (ref 1.005–1.030)
pH: 5 (ref 5.0–8.0)

## 2020-07-10 LAB — COMPREHENSIVE METABOLIC PANEL
ALT: 15 U/L (ref 0–44)
AST: 20 U/L (ref 15–41)
Albumin: 2.8 g/dL — ABNORMAL LOW (ref 3.5–5.0)
Alkaline Phosphatase: 54 U/L (ref 38–126)
Anion gap: 13 (ref 5–15)
BUN: 49 mg/dL — ABNORMAL HIGH (ref 8–23)
CO2: 20 mmol/L — ABNORMAL LOW (ref 22–32)
Calcium: 8.7 mg/dL — ABNORMAL LOW (ref 8.9–10.3)
Chloride: 105 mmol/L (ref 98–111)
Creatinine, Ser: 2.01 mg/dL — ABNORMAL HIGH (ref 0.44–1.00)
GFR, Estimated: 24 mL/min — ABNORMAL LOW (ref >60)
Glucose, Bld: 164 mg/dL — ABNORMAL HIGH (ref 70–99)
Potassium: 4.3 mmol/L (ref 3.5–5.1)
Sodium: 138 mmol/L (ref 135–145)
Total Bilirubin: 0.6 mg/dL (ref 0.3–1.2)
Total Protein: 6.5 g/dL (ref 6.5–8.1)

## 2020-07-10 LAB — CBC WITH DIFFERENTIAL/PLATELET
Abs Immature Granulocytes: 0.15 10*3/uL — ABNORMAL HIGH (ref 0.00–0.07)
Basophils Absolute: 0 10*3/uL (ref 0.0–0.1)
Basophils Relative: 0 %
Eosinophils Absolute: 0 10*3/uL (ref 0.0–0.5)
Eosinophils Relative: 0 %
HCT: 38.5 % (ref 36.0–46.0)
Hemoglobin: 12.1 g/dL (ref 12.0–15.0)
Immature Granulocytes: 2 %
Lymphocytes Relative: 7 %
Lymphs Abs: 0.6 10*3/uL — ABNORMAL LOW (ref 0.7–4.0)
MCH: 30.9 pg (ref 26.0–34.0)
MCHC: 31.4 g/dL (ref 30.0–36.0)
MCV: 98.2 fL (ref 80.0–100.0)
Monocytes Absolute: 0.4 10*3/uL (ref 0.1–1.0)
Monocytes Relative: 4 %
Neutro Abs: 8.3 10*3/uL — ABNORMAL HIGH (ref 1.7–7.7)
Neutrophils Relative %: 87 %
Platelets: 338 10*3/uL (ref 150–400)
RBC: 3.92 MIL/uL (ref 3.87–5.11)
RDW: 14.6 % (ref 11.5–15.5)
WBC: 9.5 10*3/uL (ref 4.0–10.5)
nRBC: 0 % (ref 0.0–0.2)

## 2020-07-10 LAB — MAGNESIUM: Magnesium: 2.5 mg/dL — ABNORMAL HIGH (ref 1.7–2.4)

## 2020-07-10 LAB — C-REACTIVE PROTEIN: CRP: 1.2 mg/dL — ABNORMAL HIGH (ref <1.0)

## 2020-07-10 LAB — D-DIMER, QUANTITATIVE: D-Dimer, Quant: 3.6 ug/mL-FEU — ABNORMAL HIGH (ref 0.00–0.50)

## 2020-07-10 MED ORDER — ALBUTEROL SULFATE HFA 108 (90 BASE) MCG/ACT IN AERS
2.0000 | INHALATION_SPRAY | Freq: Two times a day (BID) | RESPIRATORY_TRACT | Status: DC
  Administered 2020-07-10 – 2020-07-11 (×3): 2 via RESPIRATORY_TRACT
  Filled 2020-07-10: qty 6.7

## 2020-07-10 MED ORDER — FAMOTIDINE 20 MG PO TABS
20.0000 mg | ORAL_TABLET | Freq: Every day | ORAL | Status: DC
  Administered 2020-07-11: 20 mg via ORAL
  Filled 2020-07-10: qty 1

## 2020-07-10 MED ORDER — ALBUTEROL SULFATE HFA 108 (90 BASE) MCG/ACT IN AERS
2.0000 | INHALATION_SPRAY | Freq: Four times a day (QID) | RESPIRATORY_TRACT | Status: DC | PRN
  Filled 2020-07-10: qty 6.7

## 2020-07-10 NOTE — Evaluation (Signed)
Occupational Therapy Evaluation Patient Details Name: Shannon [name] MRN: [id number] DOB: [birth year] Today's Date: 07/10/2020    History of Present Illness 87 y.o. female with medical history significant of chronic diastolic CHF, A. fib, HTN, HLD, CKD stage III, and gout presents with complaints of worsening shortness of breath over the last day.  Husband tested positive for COVID-19 07/01/2020 and is currently hospitalized here at [facility]. In ED noted to be afebrile, pulse 94-1 07, respiration 20-25, blood pressures elevated up to 183/116, and O2 saturations noted as low as 90% on room air and was placed on 2 L nasal cannula oxygen with improvement to 94-95%.  COVID-19 screening was positive.  Chest x-ray revealed bilateral lung base pneumonias. Admitted for COVID PNA 07/07/20.   Clinical Impression   Pt admitted with above. She demonstrates the below listed deficits and will benefit from continued OT to maximize safety and independence with BADLs.  Pt presented to OT with generalized weakness, decreased activity tolerance, mild balance deficits.  She currently requires min guard to min A for ADLs, and lived with her spouse PTA. She reports she was mod I with all ADLs, and IADLs, including driving, but did need assist to don socks.  Pt's spouse currently is hospitalized with COVID - 24.  Recommend initial 24 hour assist to ensure safe transition home and that she is able to progress safely back to mod I level.  Will follow acutely.       Follow Up Recommendations  Home health OT;Supervision/Assistance - 24 hour    Equipment Recommendations  None recommended by OT    Recommendations for Other Services       Precautions / Restrictions Precautions Precautions: Fall      Mobility Bed Mobility               General bed mobility comments: sitting up in chair    Transfers Overall transfer level: Needs assistance Equipment used: Rolling walker (2 wheeled) Transfers: Sit  to/from [facility] Sit to Stand: Min guard Stand pivot transfers: Min guard       General transfer comment: min guard for safety    Balance Overall balance assessment: Mild deficits observed, not formally tested Sitting-balance support: Feet supported;No upper extremity supported Sitting balance-Leahy Scale: Good     Standing balance support: No upper extremity supported Standing balance-Leahy Scale: Fair Standing balance comment: able to maintain static standing with min guard assist                           ADL either performed or assessed with clinical judgement   ADL Overall ADL's : Needs assistance/impaired Eating/Feeding: Independent   Grooming: Wash/dry hands;Wash/dry face;Oral care;Min guard;Standing   Upper Body Bathing: Set up;Sitting   Lower Body Bathing: Min guard;Sit to/from stand   Upper Body Dressing : Set up;Sitting   Lower Body Dressing: Minimal assistance;Sit to/from stand Lower Body Dressing Details (indicate cue type and reason): required assist to don socks Toilet Transfer: Min guard;Ambulation;Comfort height toilet;RW   Toileting- Water quality scientist and Hygiene: Min guard;Sit to/from stand       Functional mobility during ADLs: Min guard;Rolling walker General ADL Comments: Pt fatigues with DOE 3/4     Vision         Perception     Praxis      Pertinent Vitals/Pain Pain Assessment: No/denies pain     Hand Dominance     Extremity/Trunk Assessment Upper Extremity Assessment  Upper Extremity Assessment: Generalized weakness   Lower Extremity Assessment Lower Extremity Assessment: Generalized weakness   Cervical / Trunk Assessment Cervical / Trunk Assessment: Normal   Communication Communication Communication: HOH   Cognition Arousal/Alertness: Awake/alert Behavior During Therapy: WFL for tasks assessed/performed Overall Cognitive Status: No family/caregiver present to determine baseline  cognitive functioning                                 General Comments: Pt is a bit slow to process info   General Comments  VSS with pt on RA    Exercises     Shoulder Instructions      Home Living Family/patient expects to be discharged to:: Private residence Living Arrangements: Spouse/significant other;Other relatives Available Help at Discharge: Family;Available 24 hours/day Type of Home: House Home Access: Level entry     Home Layout: One level     Bathroom Shower/Tub: Teacher, early years/pre: Standard Bathroom Accessibility: Yes   Home Equipment: Environmental consultant - 2 wheels;Bedside commode;Hand held shower head;Cane - single point;Shower seat   Additional Comments: Pt reports her spouse is currently hospitalized with COVID      Prior Functioning/Environment Level of Independence: Needs assistance  Gait / Transfers Assistance Needed: uses cane for ambulation for limited community ambulation ADL's / Homemaking Assistance Needed: needs assist with socks            OT Problem List: Decreased strength;Decreased activity tolerance;Impaired balance (sitting and/or standing);Decreased knowledge of use of DME or AE;Cardiopulmonary status limiting activity      OT Treatment/Interventions: Self-care/ADL training;Therapeutic exercise;DME and/or AE instruction;Energy conservation;Therapeutic activities;Patient/family education;Balance training    OT Goals(Current goals can be found in the care plan section) Acute Rehab OT Goals Patient Stated Goal: to go home OT Goal Formulation: With patient Time For Goal Achievement: 07/24/20 Potential to Achieve Goals: Good ADL Goals Pt Will Perform Grooming: (P) with supervision;standing Pt Will Perform Upper Body Bathing: (P) standing;with modified independence;sitting Pt Will Perform Lower Body Dressing: (P) with supervision;sit to/from stand Pt Will Transfer to Toilet: (P) with supervision;ambulating;regular  height toilet;grab bars Pt Will Perform Toileting - Clothing Manipulation and hygiene: (P) with supervision;sit to/from stand Pt/caregiver will Perform Home Exercise Program: (P) Increased strength;Right Upper extremity;Left upper extremity;With theraband;With written HEP provided;With Supervision Additional ADL Goal #1: (P) Pt will independently incorporate energy conservation strategies during daily activities  OT Frequency: Min 2X/week   Barriers to D/C:    Pt's spouse is also currently hospitalized       Co-evaluation              AM-PAC OT "6 Clicks" Daily Activity     Outcome Measure Help from another person eating meals?: None Help from another person taking care of personal grooming?: A Little Help from another person toileting, which includes using toliet, bedpan, or urinal?: A Little Help from another person bathing (including washing, rinsing, drying)?: A Little Help from another person to put on and taking off regular upper body clothing?: A Little Help from another person to put on and taking off regular lower body clothing?: A Little 6 Click Score: 19   End of Session Equipment Utilized During Treatment: Rolling walker Nurse Communication: Mobility status  Activity Tolerance: Patient tolerated treatment well Patient left: in chair;with call bell/phone within reach  OT Visit Diagnosis: Unsteadiness on feet (R26.81)  Time: 4591-3685 OT Time Calculation (min): 19 min Charges:  OT General Charges $OT Visit: 1 Visit OT Evaluation $OT Eval Moderate Complexity: 1 Mod  Nilsa Nutting., OTR/L Acute Rehabilitation Services Pager [phone] Office [phone]   Lucille Passy M 07/10/2020, 3:37 PM

## 2020-07-10 NOTE — TOC Initial Note (Signed)
Transition of Care [facility]) - Initial/Assessment Note    Patient Details  Name: Shannon [name] MRN: [id number] Date of Birth: 09-24-1930  Transition of Care [facility]) CM/SW Contact:    Carles Collet, RN Phone Number: 07/10/2020, 4:02 PM  Clinical Narrative:                 Damaris Schooner w patient re DC plan. Her spouse is hospitalized on 3E. He is active w [facility]. Patient would like to have [facility] as well. Referral accepted by liaison, Angie. Wife denies any need for DME in addition to what she already has at home. Tentative plan is for both patient and spouse to DC Thursday 1/27.  LVM w relative listed below to discuss DC plan and any additional support if needed. Awaiting call back. horn,benita Daughter [phone]  [phone]      Expected Discharge Plan: [place] Barriers to Discharge: Continued Medical Work up   Patient Goals and CMS Choice Patient states their goals for this hospitalization and ongoing recovery are:: to return home CMS Medicare.gov Compare Post Acute Care list provided to:: Patient Choice offered to / list presented to : Patient  Expected Discharge Plan and Services Expected Discharge Plan: [place]   Discharge Planning Services: CM Consult Post Acute Care Choice: Home Health                                        Prior Living Arrangements/Services   Lives with:: Spouse,Adult Children                   Activities of Daily Living Home Assistive Devices/Equipment: Environmental consultant (specify type) ADL Screening (condition at time of admission) Patient's cognitive ability adequate to safely complete daily activities?: Yes Is the patient deaf or have difficulty hearing?: No Does the patient have difficulty seeing, even when wearing glasses/contacts?: No Does the patient have difficulty concentrating, remembering, or making decisions?: No Patient able to express need for assistance with ADLs?:  Yes Does the patient have difficulty dressing or bathing?: No Independently performs ADLs?: Yes (appropriate for developmental age) Does the patient have difficulty walking or climbing stairs?: No Weakness of Legs: None Weakness of Arms/Hands: None  Permission Sought/Granted                  Emotional Assessment              Admission diagnosis:  COVID-19 virus infection [U07.1] Pneumonia due to COVID-19 virus [U07.1, J12.82] Patient Active Problem List   Diagnosis Date Noted  . Pneumonia due to COVID-19 virus 07/07/2020  . Elevated troponin 07/07/2020  . CKD (chronic kidney disease), stage III ([place]) 07/07/2020  . Hyperlipidemia 09/24/2017  . Hypertension 09/24/2017  . Chronic diastolic CHF (congestive heart failure) ([place]) 09/24/2017  . Gout 09/24/2017  . Osteoarthritis of right knee 08/04/2017  . Paroxysmal atrial fibrillation ([place]) 10/25/2016  . Renal arterial hypertension 07/31/2014   PCP:  [name], MD Pharmacy:   [facility] [place], [place] - [address] [place] [address] Phone: [phone] Fax: [phone]     Social Determinants of Health (SDOH) Interventions    Readmission Risk Interventions No flowsheet data found.

## 2020-07-10 NOTE — Progress Notes (Signed)
PROGRESS NOTE                                                                                                                                                                                                             Patient Demographics:    Shannon [name], is a 87 y.o. female, DOB - Apr 05, 1931, CLE:[id number]  Outpatient Primary MD for the patient is [name], MD   Admit date - 07/07/2020   LOS - 3  Chief Complaint  Patient presents with  . Shortness of Breath       Brief Narrative: Patient is a 87 y.o. female with PMHx of chronic diastolic heart failure, atrial fibrillation on anticoagulation, HTN, CKD stage IIIb, HLD, gout,-who presented with shortness of breath-found to have acute hypoxic respiratory failure due to COVID-19 pneumonia.    Per patient-her spouse is also in the hospital due to COVID-19 pneumonia.  COVID-19 vaccinated status: Vaccinated-but not boosted.  Significant Events: 1/23>> Admit to [facility] for hypoxia due to COVID-19 pneumonia  Significant studies: 1/23>>Chest x-ray: Bilateral lung infiltrates 1/24>> bilateral lower extremity Doppler: No DVT  COVID-19 medications: Steroids:1/23>> Remdesivir:1/23>>  Antibiotics: None  Microbiology data: 1/23>>Blood culture: No growth  Procedures: None  Consults: None  DVT prophylaxis: Rivaroxaban (XARELTO) tablet 15 mg     Subjective:   No major issues-on room air this morning.   Assessment  & Plan :   Acute Hypoxic Resp Failure due to Covid 19 Viral pneumonia: Overall improved-has been titrated to room air for the past several days-CRP continues to downtrend-remains on steroids and Remdesivir.   Fever: afebrile O2 requirements:  SpO2: 100 % O2 Flow Rate (L/min): 2 L/min   COVID-19 Labs: Recent Labs    07/07/20 2223 07/08/20 0108 07/09/20 0127 07/10/20 0027  DDIMER 3.92* 3.81* 3.73* 3.60*  FERRITIN 134 137 128  --   LDH 245*  --    --   --   CRP 2.3* 2.2* 1.8* 1.2*       Component Value Date/Time   BNP 370.7 (H) 07/07/2020 0834    Recent Labs  Lab 07/07/20 1058 07/07/20 2223  PROCALCITON <0.10 <0.10    Lab Results  Component Value Date   SARSCOV2NAA POSITIVE (A) 07/07/2020     Prone/Incentive Spirometry: encouraged  incentive spirometry use 3-4/hour.  Elevated D-dimer: Likely due to COVID-19 related inflammation-on  chronic anticoagulation with Xarelto-lower extremity Doppler negative for DVT-since on Xarelto chronically-doubt further work-up required.    AKI on CKD stage IIIb: Creatinine continues to worsen-volume status is stable-check UA, renal ultrasound.  Have ordered frequent bladder scans.  Continue to avoid nephrotoxic agents-hoping that creatinine will plateau soon.  Suspect that this is likely hemodynamically mediated.  If worsens significantly-we will need to consult nephrology at some point  PAF: Maintaining sinus rhythm-continue pindolol-on Xarelto  Minimally elevated troponin: Due to demand ischemia-doubt ACS.  Chronic diastolic heart failure: Euvolemic on exam-torsemide on hold due to AKI.  HTN: BP stable-continue hydralazine, pindolol, Imdur  HLD: Continue Crestor  History of gout: No flare-continue colchicine and Uloric  Obesity: Estimated body mass index is 33.8 kg/m as calculated from the following:   Height as of this encounter: 5\' 7"  (1.702 m).   Weight as of this encounter: 97.9 kg.   ABG: No results found for: PHART, PCO2ART, PO2ART, HCO3, TCO2, ACIDBASEDEF, O2SAT  Vent Settings: N/A  Condition -Stable  Family Communication  : Daughter-Benita Horn-[phone] updated over the phone 1/26  Code Status :  Full Code  Diet :  Diet Order            Diet Heart Room service appropriate? Yes; Fluid consistency: Thin  Diet effective now                  Disposition Plan  :   Status is: Inpatient  Remains inpatient appropriate because:Inpatient level of care  appropriate due to severity of illness   Dispo: The patient is from: Home              Anticipated d/c is to: TBD              Anticipated d/c date is: > 3 days              Patient currently is not medically stable to d/c.   Difficult to place patient No   Barriers to discharge: Hypoxia requiring O2 supplementation/complete 5 days of IV Remdesivir  Antimicorbials  :    Anti-infectives (From admission, onward)   Start     Dose/Rate Route Frequency Ordered Stop   07/08/20 1000  remdesivir 100 mg in sodium chloride 0.9 % 100 mL IVPB       "Followed by" Linked Group Details   100 mg 200 mL/hr over 30 Minutes Intravenous Daily 07/07/20 1031 07/12/20 0959   07/07/20 1300  remdesivir 200 mg in sodium chloride 0.9% 250 mL IVPB       "Followed by" Linked Group Details   200 mg 580 mL/hr over 30 Minutes Intravenous Once 07/07/20 1031 07/07/20 1629      Inpatient Medications  Scheduled Meds: . albuterol  2 puff Inhalation BID  . vitamin C  500 mg Oral Daily  . colchicine  0.6 mg Oral Daily  . [START ON 07/11/2020] famotidine  20 mg Oral Daily  . febuxostat  40 mg Oral Daily  . hydrALAZINE  100 mg Oral TID  . isosorbide dinitrate  30 mg Oral TID  . pindolol  5 mg Oral BID  . potassium chloride SA  20 mEq Oral Daily  . predniSONE  50 mg Oral Daily  . Rivaroxaban  15 mg Oral Q supper  . rosuvastatin  20 mg Oral QPM  . sodium chloride flush  3 mL Intravenous Q12H  . zinc sulfate  220 mg Oral Daily   Continuous Infusions: . remdesivir 100 mg in NS  100 mL 100 mg (07/10/20 0847)   PRN Meds:.albuterol, chlorpheniramine-HYDROcodone, guaiFENesin-dextromethorphan, ondansetron **OR** ondansetron (ZOFRAN) IV   Time Spent in minutes  25  See all Orders from today for further details   Oren Binet M.D on 07/10/2020 at 11:53 AM  To page go to [email or web address] - use universal password  [facility] -  Office  [phone]    Objective:   Vitals:   07/09/20 1147 07/09/20  2018 07/10/20 0007 07/10/20 0446  BP: (!) 144/73 124/60  (!) 143/91  Pulse: 76 80  82  Resp: 20 (!) 23  20  Temp: 97.6 F (36.4 C) (!) 97.2 F (36.2 C)  97.7 F (36.5 C)  TempSrc: Oral Oral  Oral  SpO2: 100% 99%  100%  Weight:      Height:   5\' 7"  (1.702 m)     Wt Readings from Last 3 Encounters:  07/07/20 97.9 kg  03/29/19 99.2 kg  12/20/18 95.7 kg     Intake/Output Summary (Last 24 hours) at 07/10/2020 1153 Last data filed at 07/10/2020 0300 Gross per 24 hour  Intake 240 ml  Output 170 ml  Net 70 ml     Physical Exam Gen Exam:Alert awake-not in any distress HEENT:atraumatic, normocephalic Chest: B/L clear to auscultation anteriorly CVS:S1S2 regular Abdomen:soft non tender, non distended Extremities:no edema Neurology: Non focal Skin: no rash   Data Review:    CBC Recent Labs  Lab 07/07/20 0834 07/08/20 0108 07/09/20 0127 07/10/20 0027  WBC 4.4 4.0 7.8 9.5  HGB 13.5 12.0 12.5 12.1  HCT 42.2 36.2 37.9 38.5  PLT 235 243 299 338  MCV 99.3 96.3 96.7 98.2  MCH 31.8 31.9 31.9 30.9  MCHC 32.0 33.1 33.0 31.4  RDW 14.5 14.3 14.6 14.6  LYMPHSABS 0.8 0.6* 0.6* 0.6*  MONOABS 0.3 0.1 0.1 0.4  EOSABS 0.2 0.0 0.0 0.0  BASOSABS 0.0 0.0 0.0 0.0    Chemistries  Recent Labs  Lab 07/07/20 0834 07/08/20 0108 07/09/20 0127 07/10/20 0027  NA 139 140 138 138  K 4.0 4.1 3.8 4.3  CL 106 108 105 105  CO2 23 23 23  20*  GLUCOSE 131* 119* 170* 164*  BUN 9 15 33* 49*  CREATININE 1.09* 1.16* 1.78* 2.01*  CALCIUM 8.9 8.6* 8.5* 8.7*  MG  --  2.0 1.9 2.5*  AST 25 21 20 20   ALT 15 16 15 15   ALKPHOS 61 58 60 54  BILITOT 1.2 1.0 1.1 0.6   ------------------------------------------------------------------------------------------------------------------ No results for input(s): CHOL, HDL, LDLCALC, TRIG, CHOLHDL, LDLDIRECT in the last 72 hours.  Lab Results  Component Value Date   HGBA1C 5.5 09/24/2017    ------------------------------------------------------------------------------------------------------------------ No results for input(s): TSH, T4TOTAL, T3FREE, THYROIDAB in the last 72 hours.  Invalid input(s): FREET3 ------------------------------------------------------------------------------------------------------------------ Recent Labs    07/08/20 0108 07/09/20 0127  FERRITIN 137 128    Coagulation profile No results for input(s): INR, PROTIME in the last 168 hours.  Recent Labs    07/09/20 0127 07/10/20 0027  DDIMER 3.73* 3.60*    Cardiac Enzymes No results for input(s): CKMB, TROPONINI, MYOGLOBIN in the last 168 hours.  Invalid input(s): CK ------------------------------------------------------------------------------------------------------------------    Component Value Date/Time   BNP 370.7 (H) 07/07/2020 4034    Micro Results Recent Results (from the past 240 hour(s))  SARS Coronavirus 2 by RT PCR (hospital order, performed in [facility] hospital lab) Nasopharyngeal Nasopharyngeal Swab     Status: Abnormal   Collection Time: 07/07/20  8:16 AM   Specimen:  Nasopharyngeal Swab  Result Value Ref Range Status   SARS Coronavirus 2 POSITIVE (A) NEGATIVE Final    Comment: RESULT CALLED TO, READ BACK BY AND VERIFIED WITH: RN Richarda Blade [phone] MLM (NOTE) SARS-CoV-2 target nucleic acids are DETECTED  SARS-CoV-2 RNA is generally detectable in upper respiratory specimens  during the acute phase of infection.  Positive results are indicative  of the presence of the identified virus, but do not rule out bacterial infection or co-infection with other pathogens not detected by the test.  Clinical correlation with patient history and  other diagnostic information is necessary to determine patient infection status.  The expected result is negative.  Fact Sheet for Patients:   StrictlyIdeas.no   Fact Sheet for Healthcare Providers:    BankingDealers.co.za    This test is not yet approved or cleared by the [place] FDA and  has been authorized for detection and/or diagnosis of SARS-CoV-2 by FDA under an Emergency Use Authorization (EUA).  This EUA will remain in effect (meaning this test can b e used) for the duration of  the COVID-19 declaration under Section 564(b)(1) of the Act, 21 U.S.C. section 360-bbb-3(b)(1), unless the authorization is terminated or revoked sooner.  Performed at [facility], [place] [address] [place], [place] [address]   Blood Culture (routine x 2)     Status: None (Preliminary result)   Collection Time: 07/07/20 10:19 AM   Specimen: BLOOD  Result Value Ref Range Status   Specimen Description BLOOD BLOOD RIGHT FOREARM  Final   Special Requests   Final    BOTTLES DRAWN AEROBIC AND ANAEROBIC Blood Culture results may not be optimal due to an inadequate volume of blood received in culture bottles   Culture   Final    NO GROWTH 3 DAYS Performed at [facility], [place] [address] [place], [place] [address]    Report Status PENDING  Incomplete  Blood Culture (routine x 2)     Status: None (Preliminary result)   Collection Time: 07/07/20 10:24 AM   Specimen: BLOOD  Result Value Ref Range Status   Specimen Description BLOOD LEFT ANTECUBITAL  Final   Special Requests   Final    BOTTLES DRAWN AEROBIC AND ANAEROBIC Blood Culture adequate volume   Culture   Final    NO GROWTH 3 DAYS Performed at [facility], [place] [address] [place], [place] [address]    Report Status PENDING  Incomplete    Radiology Reports DG Chest Portable 1 View  Result Date: 07/07/2020 CLINICAL DATA:  Shortness of breath. EXAM: PORTABLE CHEST 1 VIEW COMPARISON:  March 11, 2018 FINDINGS: The mediastinal contour and cardiac silhouette are stable. The heart size enlarged. Patchy consolidation of bilateral lung bases are noted. There is no pulmonary edema. Degenerative joint  changes of bilateral shoulders are noted. IMPRESSION: Bilateral lung base pneumonias. Electronically Signed   By: Abelardo Diesel M.D.   On: 07/07/2020 08:47   VAS [place] LOWER EXTREMITY VENOUS (DVT)  Result Date: 07/09/2020  Lower Venous DVT Study Other Indications: Swelling, hypoxia, elevated d-dimer, and covid+. Limitations: Body habitus, poor ultrasound/tissue interface and Patient inability to cooperate. Comparison Study: No prior exams Performing Technologist: Rogelia Rohrer  Examination Guidelines: A complete evaluation includes B-mode imaging, spectral Doppler, color Doppler, and power Doppler as needed of all accessible portions of each vessel. Bilateral testing is considered an integral part of a complete examination. Limited examinations for reoccurring indications may be performed as noted. The  reflux portion of the exam is performed with the patient in reverse Trendelenburg.  +---------+---------------+---------+-----------+----------+--------------+ RIGHT    CompressibilityPhasicitySpontaneityPropertiesThrombus Aging +---------+---------------+---------+-----------+----------+--------------+ CFV      Full           Yes      Yes                                 +---------+---------------+---------+-----------+----------+--------------+ SFJ      Full                                                        +---------+---------------+---------+-----------+----------+--------------+ FV Prox  Full           Yes      Yes                                 +---------+---------------+---------+-----------+----------+--------------+ FV Mid   Full           Yes      Yes                                 +---------+---------------+---------+-----------+----------+--------------+ FV DistalFull           Yes      Yes                                 +---------+---------------+---------+-----------+----------+--------------+ PFV      Full                                                         +---------+---------------+---------+-----------+----------+--------------+ POP      Full           Yes      Yes                                 +---------+---------------+---------+-----------+----------+--------------+ PTV      Full                                                        +---------+---------------+---------+-----------+----------+--------------+ PERO     Full                                                        +---------+---------------+---------+-----------+----------+--------------+   +---------+---------------+---------+-----------+----------+--------------+ LEFT     CompressibilityPhasicitySpontaneityPropertiesThrombus Aging +---------+---------------+---------+-----------+----------+--------------+ CFV      Full           Yes      Yes                                 +---------+---------------+---------+-----------+----------+--------------+  SFJ      Full                                                        +---------+---------------+---------+-----------+----------+--------------+ FV Prox  Full           Yes      Yes                                 +---------+---------------+---------+-----------+----------+--------------+ FV Mid   Full           Yes      Yes                                 +---------+---------------+---------+-----------+----------+--------------+ FV DistalFull           Yes      Yes                                 +---------+---------------+---------+-----------+----------+--------------+ PFV      Full                                                        +---------+---------------+---------+-----------+----------+--------------+ POP      Full           Yes      Yes                                 +---------+---------------+---------+-----------+----------+--------------+ PTV      Full                                                         +---------+---------------+---------+-----------+----------+--------------+ PERO     Full                                                        +---------+---------------+---------+-----------+----------+--------------+     Summary: BILATERAL: - No evidence of deep vein thrombosis seen in the lower extremities, bilaterally. -No evidence of popliteal cyst, bilaterally.   *See table(s) above for measurements and observations. Electronically signed by Deitra Mayo MD on 07/09/2020 at 7:48:05 AM.    Final

## 2020-07-11 DIAGNOSIS — N179 Acute kidney failure, unspecified: Secondary | ICD-10-CM

## 2020-07-11 LAB — CBC WITH DIFFERENTIAL/PLATELET
Abs Immature Granulocytes: 0.17 10*3/uL — ABNORMAL HIGH (ref 0.00–0.07)
Basophils Absolute: 0 10*3/uL (ref 0.0–0.1)
Basophils Relative: 0 %
Eosinophils Absolute: 0 10*3/uL (ref 0.0–0.5)
Eosinophils Relative: 0 %
HCT: 37.9 % (ref 36.0–46.0)
Hemoglobin: 12.3 g/dL (ref 12.0–15.0)
Immature Granulocytes: 2 %
Lymphocytes Relative: 6 %
Lymphs Abs: 0.5 10*3/uL — ABNORMAL LOW (ref 0.7–4.0)
MCH: 31.9 pg (ref 26.0–34.0)
MCHC: 32.5 g/dL (ref 30.0–36.0)
MCV: 98.4 fL (ref 80.0–100.0)
Monocytes Absolute: 0.6 10*3/uL (ref 0.1–1.0)
Monocytes Relative: 6 %
Neutro Abs: 7.8 10*3/uL — ABNORMAL HIGH (ref 1.7–7.7)
Neutrophils Relative %: 86 %
Platelets: 281 10*3/uL (ref 150–400)
RBC: 3.85 MIL/uL — ABNORMAL LOW (ref 3.87–5.11)
RDW: 14.7 % (ref 11.5–15.5)
WBC: 9.1 10*3/uL (ref 4.0–10.5)
nRBC: 0 % (ref 0.0–0.2)

## 2020-07-11 LAB — D-DIMER, QUANTITATIVE: D-Dimer, Quant: 3.59 ug/mL-FEU — ABNORMAL HIGH (ref 0.00–0.50)

## 2020-07-11 LAB — C-REACTIVE PROTEIN: CRP: 1.1 mg/dL — ABNORMAL HIGH (ref <1.0)

## 2020-07-11 LAB — COMPREHENSIVE METABOLIC PANEL
ALT: 19 U/L (ref 0–44)
AST: 27 U/L (ref 15–41)
Albumin: 2.8 g/dL — ABNORMAL LOW (ref 3.5–5.0)
Alkaline Phosphatase: 58 U/L (ref 38–126)
Anion gap: 9 (ref 5–15)
BUN: 52 mg/dL — ABNORMAL HIGH (ref 8–23)
CO2: 21 mmol/L — ABNORMAL LOW (ref 22–32)
Calcium: 8.6 mg/dL — ABNORMAL LOW (ref 8.9–10.3)
Chloride: 108 mmol/L (ref 98–111)
Creatinine, Ser: 1.85 mg/dL — ABNORMAL HIGH (ref 0.44–1.00)
GFR, Estimated: 26 mL/min — ABNORMAL LOW (ref >60)
Glucose, Bld: 155 mg/dL — ABNORMAL HIGH (ref 70–99)
Potassium: 4.6 mmol/L (ref 3.5–5.1)
Sodium: 138 mmol/L (ref 135–145)
Total Bilirubin: 0.7 mg/dL (ref 0.3–1.2)
Total Protein: 6.3 g/dL — ABNORMAL LOW (ref 6.5–8.1)

## 2020-07-11 LAB — MAGNESIUM: Magnesium: 2.6 mg/dL — ABNORMAL HIGH (ref 1.7–2.4)

## 2020-07-11 MED ORDER — TORSEMIDE 20 MG PO TABS: 40.0000 mg | ORAL_TABLET | Freq: Two times a day (BID) | ORAL | 3 refills | Status: DC

## 2020-07-11 MED ORDER — BENZONATATE 100 MG PO CAPS: 100.0000 mg | ORAL_CAPSULE | Freq: Four times a day (QID) | ORAL | 0 refills | Status: AC | PRN

## 2020-07-11 MED ORDER — ALBUTEROL SULFATE HFA 108 (90 BASE) MCG/ACT IN AERS: 2.0000 | INHALATION_SPRAY | Freq: Four times a day (QID) | RESPIRATORY_TRACT | 0 refills | Status: DC | PRN

## 2020-07-11 MED ORDER — PREDNISONE 10 MG PO TABS: ORAL_TABLET | ORAL | 0 refills | Status: DC

## 2020-07-11 NOTE — Discharge Instructions (Addendum)
10 Things You Can Do to Manage Your COVID-19 Symptoms at Home If you have possible or confirmed COVID-19: 1. Stay home except to get medical care. 2. Monitor your symptoms carefully. If your symptoms get worse, call your healthcare provider immediately. 3. Get rest and stay hydrated. 4. If you have a medical appointment, call the healthcare provider ahead of time and tell them that you have or may have COVID-19. 5. For medical emergencies, call 911 and notify the dispatch personnel that you have or may have COVID-19. 6. Cover your cough and sneezes with a tissue or use the inside of your elbow. 7. Wash your hands often with soap and water for at least 20 seconds or clean your hands with an alcohol-based hand sanitizer that contains at least 60% alcohol. 8. As much as possible, stay in a specific room and away from other people in your home. Also, you should use a separate bathroom, if available. If you need to be around other people in or outside of the home, wear a mask. 9. Avoid sharing personal items with other people in your household, like dishes, towels, and bedding. 10. Clean all surfaces that are touched often, like counters, tabletops, and doorknobs. Use household cleaning sprays or wipes according to the label instructions. [email or web address] 12/29/2019 This information is not intended to replace advice given to you by your health care provider. Make sure you discuss any questions you have with your health care provider. Document Revised: 04/15/2020 Document Reviewed: 04/15/2020 Elsevier Patient Education  2021 [place] vs. Isolation QUARANTINE keeps someone who was in close contact with someone who has COVID-19 away from others. Quarantine if you have been in close contact with someone who has COVID-19, unless you have been fully vaccinated. If you are fully vaccinated  You do NOT need to quarantine unless they have symptoms  Get tested 3-5 days after  your exposure, even if you don't have symptoms  Wear a mask indoors in public for 14 days following exposure or until your test result is negative If you are not fully vaccinated  Stay home for 14 days after your last contact with a person who has COVID-19  Watch for fever (100.78F), cough, shortness of breath, or other symptoms of COVID-19  If possible, stay away from people you live with, especially people who are at higher risk for getting very sick from COVID-19  Contact your local public health department for options in your area to possibly shorten your quarantine ISOLATION keeps someone who is sick or tested positive for COVID-19 without symptoms away from others, even in their own home. People who are in isolation should stay home and stay in a specific "sick room" or area and use a separate bathroom (if available). If you are sick and think or know you have COVID-19 Stay home until after  At least 10 days since symptoms first appeared and  At least 24 hours with no fever without the use of fever-reducing medications and  Symptoms have improved If you tested positive for COVID-19 but do not have symptoms  Stay home until after 10 days have passed since your positive viral test  If you develop symptoms after testing positive, follow the steps above for those who are sick [email or web address] 03/11/2020 This information is not intended to replace advice given to you by your health care provider. Make sure you discuss any questions you have with your health care provider. Document Revised: 04/15/2020 Document Reviewed: 04/15/2020 Elsevier Patient Education  [place].   Acute Kidney Injury, Adult  Acute kidney injury is a sudden worsening of kidney function. The kidneys are organs that have several jobs. They filter the blood to remove waste products and extra fluid. They also maintain a healthy balance of minerals and hormones in the body, which helps control blood  pressure and keep bones strong. With this condition, your kidneys do not do their jobs as well as they should. This condition ranges from mild to severe. Over time, it may develop into long-lasting (chronic) kidney disease. Early detection and treatment may prevent acute kidney injury from developing into a chronic condition. What are the causes? Common causes of this condition include:  A problem with blood flow to the kidneys. This may be caused by: ? Low blood pressure (hypotension) or shock. ? Blood loss. ? Heart and blood vessel (cardiovascular) disease. ? Severe burns. ? Liver disease.  Direct damage to the kidneys. This may be caused by: ? Certain medicines. ? A kidney infection. ? Poisoning. ? Being around or in contact with toxic substances. ? A surgical wound. ? A hard, direct hit to the kidney area.  A sudden blockage of urine flow. This may be caused by: ? Cancer. ? Kidney stones. ? An enlarged prostate in males. What increases the risk? You are more likely to develop this condition if you:  Are older than age 49.  Are female.  Are hospitalized, especially if you are in critical condition.  Have certain conditions, such as: ? Chronic kidney disease. ? Diabetes. ? Coronary artery disease and heart failure. ? Pulmonary disease. ? Chronic liver disease. What are the signs or symptoms? Symptoms of this condition may not be obvious until the condition becomes severe. Symptoms of this condition can include:  Tiredness (lethargy) or difficulty staying awake.  Nausea or vomiting.  Swelling (edema) of the face, legs, ankles, or feet.  Problems with urination, such as: ? Pain in the abdomen, or pain along the side of your stomach (flank). ? Producing little or no urine. ? Passing urine with a weak flow.  Muscle twitches and cramps, especially in the legs.  Confusion or trouble concentrating.  Loss of appetite.  Fever. How is this diagnosed? Your health  care provider can diagnose this condition based on your symptoms, medical history, and a physical exam.  You may also have other tests, such as:  Blood tests.  Urine tests.  Imaging tests.  A test in which a sample of tissue is removed from the kidneys to be examined under a microscope (kidney biopsy). How is this treated? Treatment for this condition depends on the cause and how severe the condition is. In mild cases, treatment may not be needed. The kidneys may heal on their own. In more severe cases, treatment will involve:  Treating the cause of the kidney injury. This may involve changing any medicines you are taking or adjusting your dosage.  Fluids. You may need specialized IV fluids to balance your body's needs.  Having a catheter placed to drain urine and prevent blockages.  Preventing problems from occurring. This may mean avoiding certain medicines or procedures that can cause further injury to the kidneys. In some cases, treatment may also require:  A procedure to remove toxic wastes from the body (dialysis or continuous renal replacement therapy, CRRT).  Surgery. This may be done to repair a torn kidney or to remove the blockage from the urinary system. Follow these instructions at home: Medicines  Take  over-the-counter and prescription medicines only as told by your health care provider.  Do not take any new medicines without your health care provider's approval. Many medicines can worsen your kidney damage.  Do not take any vitamin and mineral supplements without your health care provider's approval. Many nutritional supplements can worsen your kidney damage. Lifestyle  If your health care provider prescribed changes to your diet, follow them. You may need to decrease the amount of protein you eat.  Achieve and maintain a healthy weight. If you need help with this, ask your health care provider.  Start or continue an exercise plan. Try to exercise at least 30  minutes a day, 5 days a week.  Do not use any products that contain nicotine or tobacco, such as cigarettes, e-cigarettes, and chewing tobacco. If you need help quitting, ask your health care provider.   General instructions  Keep track of your blood pressure. Report changes in your blood pressure as told by your health care provider.  Stay up to date with your vaccines. Ask your health care provider which vaccines you need.  Keep all follow-up visits as told by your health care provider. This is important.   Where to find more information  American Association of Kidney Patients: BombTimer.gl  National Kidney Foundation: www.kidney.[place]: https://mathis.com/  Life Options [facility]: ? www.lifeoptions.org ? www.kidneyschool.org Contact a health care provider if:  Your symptoms get worse.  You develop new symptoms. Get help right away if:  You develop symptoms of worsening kidney disease, which include: ? Headaches. ? Abnormally dark or light skin. ? Easy bruising. ? Frequent hiccups. ? Chest pain. ? Shortness of breath. ? End of menstruation in women. ? Seizures. ? Confusion or altered mental status. ? Abdominal or back pain. ? Itchiness.  You have a fever.  Your body is producing less urine.  You have pain or bleeding when you urinate. Summary  Acute kidney injury is a sudden worsening of kidney function.  Acute kidney injury can be caused by problems with blood flow to the kidneys, direct damage to the kidneys, and sudden blockage of urine flow.  Symptoms of this condition may not be obvious until it becomes severe. Symptoms may include edema, lethargy, confusion, nausea or vomiting, and problems passing urine.  This condition can be diagnosed with blood tests, urine tests, and imaging tests. Sometimes a kidney biopsy is done to diagnose this condition.  Treatment for this condition often involves treating the underlying cause. It  is treated with fluids, medicines, diet changes, dialysis, or surgery. This information is not intended to replace advice given to you by your health care provider. Make sure you discuss any questions you have with your health care provider. Document Revised: 04/11/2019 Document Reviewed: 04/11/2019 Elsevier Patient Education  2021 [place].   Atrial Fibrillation  Atrial fibrillation is a type of heartbeat that is irregular or fast. If you have this condition, your heart beats without any order. This makes it hard for your heart to pump blood in a normal way. Atrial fibrillation may come and go, or it may become a long-lasting problem. If this condition is not treated, it can put you at higher risk for stroke, heart failure, and other heart problems. What are the causes? This condition may be caused by diseases that damage the heart. They include:  High blood pressure.  Heart failure.  Heart valve disease.  Heart surgery. Other causes include:  Diabetes.  Thyroid disease.  Being  overweight.  Kidney disease. Sometimes the cause is not known. What increases the risk? You are more likely to develop this condition if:  You are older.  You smoke.  You exercise often and very hard.  You have a family history of this condition.  You are a man.  You use drugs.  You drink a lot of alcohol.  You have lung conditions, such as emphysema, pneumonia, or COPD.  You have sleep apnea. What are the signs or symptoms? Common symptoms of this condition include:  A feeling that your heart is beating very fast.  Chest pain or discomfort.  Feeling short of breath.  Suddenly feeling light-headed or weak.  Getting tired easily during activity.  Fainting.  Sweating. In some cases, there are no symptoms. How is this treated? Treatment for this condition depends on underlying conditions and how you feel when you have atrial fibrillation. They include:  Medicines  to: ? Prevent blood clots. ? Treat heart rate or heart rhythm problems.  Using devices, such as a pacemaker, to correct heart rhythm problems.  Doing surgery to remove the part of the heart that sends bad signals.  Closing an area where clots can form in the heart (left atrial appendage). In some cases, your doctor will treat other underlying conditions. Follow these instructions at home: Medicines  Take over-the-counter and prescription medicines only as told by your doctor.  Do not take any new medicines without first talking to your doctor.  If you are taking blood thinners: ? Talk with your doctor before you take any medicines that have aspirin or NSAIDs, such as ibuprofen, in them. ? Take your medicine exactly as told by your doctor. Take it at the same time each day. ? Avoid activities that could hurt or bruise you. Follow instructions about how to prevent falls. ? Wear a bracelet that says you are taking blood thinners. Or, carry a card that lists what medicines you take. Lifestyle  Do not use any products that have nicotine or tobacco in them. These include cigarettes, e-cigarettes, and chewing tobacco. If you need help quitting, ask your doctor.  Eat heart-healthy foods. Talk with your doctor about the right eating plan for you.  Exercise regularly as told by your doctor.  Do not drink alcohol.  Lose weight if you are overweight.  Do not use drugs, including cannabis.      General instructions  If you have a condition that causes breathing to stop for a short period of time (apnea), treat it as told by your doctor.  Keep a healthy weight. Do not use diet pills unless your doctor says they are safe for you. Diet pills may make heart problems worse.  Keep all follow-up visits as told by your doctor. This is important. Contact a doctor if:  You notice a change in the speed, rhythm, or strength of your heartbeat.  You are taking a blood-thinning medicine and you  get more bruising.  You get tired more easily when you move or exercise.  You have a sudden change in weight. Get help right away if:  You have pain in your chest or your belly (abdomen).  You have trouble breathing.  You have side effects of blood thinners, such as blood in your vomit, poop (stool), or pee (urine), or bleeding that cannot stop.  You have any signs of a stroke. "BE FAST" is an easy way to remember the main warning signs: ? B - Balance. Signs are dizziness, sudden  trouble walking, or loss of balance. ? E - Eyes. Signs are trouble seeing or a change in how you see. ? F - Face. Signs are sudden weakness or loss of feeling in the face, or the face or eyelid drooping on one side. ? A - Arms. Signs are weakness or loss of feeling in an arm. This happens suddenly and usually on one side of the body. ? S - Speech. Signs are sudden trouble speaking, slurred speech, or trouble understanding what people say. ? T - Time. Time to call emergency services. Write down what time symptoms started.  You have other signs of a stroke, such as: ? A sudden, very bad headache with no known cause. ? Feeling like you may vomit (nausea). ? Vomiting. ? A seizure. These symptoms may be an emergency. Do not wait to see if the symptoms will go away. Get medical help right away. Call your local emergency services (911 in the [place] Do not drive yourself to the hospital.   Summary  Atrial fibrillation is a type of heartbeat that is irregular or fast.  You are at higher risk of this condition if you smoke, are older, have diabetes, or are overweight.  Follow your doctor's instructions about medicines, diet, exercise, and follow-up visits.  Get help right away if you have signs or symptoms of a stroke.  Get help right away if you cannot catch your breath, or you have chest pain or discomfort. This information is not intended to replace advice given to you by your health care provider. Make sure  you discuss any questions you have with your health care provider. Document Revised: 11/23/2018 Document Reviewed: 11/23/2018 Elsevier Patient Education  [place] Under Monitoring Name: Tiandra Swoveland  Location: [address] [id number]   Infection Prevention Recommendations for Individuals Confirmed to have, or Being Evaluated for, 2019 Novel Coronavirus (COVID-19) Infection Who Receive Care at Home  Individuals who are confirmed to have, or are being evaluated for, COVID-19 should follow the prevention steps below until a healthcare provider or local or state health department says they can return to normal activities.  Stay home except to get medical care You should restrict activities outside your home, except for getting medical care. Do not go to work, school, or public areas, and do not use public transportation or taxis.  Call ahead before visiting your doctor Before your medical appointment, call the healthcare provider and tell them that you have, or are being evaluated for, COVID-19 infection. This will help the healthcare providers office take steps to keep other people from getting infected. Ask your healthcare provider to call the local or state health department.  Monitor your symptoms Seek prompt medical attention if your illness is worsening (e.g., difficulty breathing). Before going to your medical appointment, call the healthcare provider and tell them that you have, or are being evaluated for, COVID-19 infection. Ask your healthcare provider to call the local or state health department.  Wear a facemask You should wear a facemask that covers your nose and mouth when you are in the same room with other people and when you visit a healthcare provider. People who live with or visit you should also wear a facemask while they are in the same room with you.  Separate yourself from other people in your home As much as possible, you  should stay in a different room from other people in your home. Also, you should use  a separate bathroom, if available.  Avoid sharing household items You should not share dishes, drinking glasses, cups, eating utensils, towels, bedding, or other items with other people in your home. After using these items, you should wash them thoroughly with soap and water.  Cover your coughs and sneezes Cover your mouth and nose with a tissue when you cough or sneeze, or you can cough or sneeze into your sleeve. Throw used tissues in a lined trash can, and immediately wash your hands with soap and water for at least 20 seconds or use an alcohol-based hand rub.  Wash your [facility] your hands often and thoroughly with soap and water for at least 20 seconds. You can use an alcohol-based hand sanitizer if soap and water are not available and if your hands are not visibly dirty. Avoid touching your eyes, nose, and mouth with unwashed hands.   Prevention Steps for Caregivers and Household Members of Individuals Confirmed to have, or Being Evaluated for, COVID-19 Infection Being Cared for in the Home  If you live with, or provide care at home for, a person confirmed to have, or being evaluated for, COVID-19 infection please follow these guidelines to prevent infection:  Follow healthcare providers instructions Make sure that you understand and can help the patient follow any healthcare provider instructions for all care.  Provide for the patients basic needs You should help the patient with basic needs in the home and provide support for getting groceries, prescriptions, and other personal needs.  Monitor the patients symptoms If they are getting sicker, call his or her medical provider and tell them that the patient has, or is being evaluated for, COVID-19 infection. This will help the healthcare providers office take steps to keep other people from getting infected. Ask the healthcare provider  to call the local or state health department.  Limit the number of people who have contact with the patient  If possible, have only one caregiver for the patient.  Other household members should stay in another home or place of residence. If this is not possible, they should stay  in another room, or be separated from the patient as much as possible. Use a separate bathroom, if available.  Restrict visitors who do not have an essential need to be in the home.  Keep older adults, very young children, and other sick people away from the patient Keep older adults, very young children, and those who have compromised immune systems or chronic health conditions away from the patient. This includes people with chronic heart, lung, or kidney conditions, diabetes, and cancer.  Ensure good ventilation Make sure that shared spaces in the home have good air flow, such as from an air conditioner or an opened window, weather permitting.  Wash your hands often  Wash your hands often and thoroughly with soap and water for at least 20 seconds. You can use an alcohol based hand sanitizer if soap and water are not available and if your hands are not visibly dirty.  Avoid touching your eyes, nose, and mouth with unwashed hands.  Use disposable paper towels to dry your hands. If not available, use dedicated cloth towels and replace them when they become wet.  Wear a facemask and gloves  Wear a disposable facemask at all times in the room and gloves when you touch or have contact with the patients blood, body fluids, and/or secretions or excretions, such as sweat, saliva, sputum, nasal mucus, vomit, urine, or feces.  Ensure the mask  fits over your nose and mouth tightly, and do not touch it during use.  Throw out disposable facemasks and gloves after using them. Do not reuse.  Wash your hands immediately after removing your facemask and gloves.  If your personal clothing becomes contaminated, carefully  remove clothing and launder. Wash your hands after handling contaminated clothing.  Place all used disposable facemasks, gloves, and other waste in a lined container before disposing them with other household waste.  Remove gloves and wash your hands immediately after handling these items.  Do not share dishes, glasses, or other household items with the patient  Avoid sharing household items. You should not share dishes, drinking glasses, cups, eating utensils, towels, bedding, or other items with a patient who is confirmed to have, or being evaluated for, COVID-19 infection.  After the person uses these items, you should wash them thoroughly with soap and water.  Wash laundry thoroughly  Immediately remove and wash clothes or bedding that have blood, body fluids, and/or secretions or excretions, such as sweat, saliva, sputum, nasal mucus, vomit, urine, or feces, on them.  Wear gloves when handling laundry from the patient.  Read and follow directions on labels of laundry or clothing items and detergent. In general, wash and dry with the warmest temperatures recommended on the label.  Clean all areas the individual has used often  Clean all touchable surfaces, such as counters, tabletops, doorknobs, bathroom fixtures, toilets, phones, keyboards, tablets, and bedside tables, every day. Also, clean any surfaces that may have blood, body fluids, and/or secretions or excretions on them.  Wear gloves when cleaning surfaces the patient has come in contact with.  Use a diluted bleach solution (e.g., dilute bleach with 1 part bleach and 10 parts water) or a household disinfectant with a label that says EPA-registered for coronaviruses. To make a bleach solution at home, add 1 tablespoon of bleach to 1 quart (4 cups) of water. For a larger supply, add  cup of bleach to 1 gallon (16 cups) of water.  Read labels of cleaning products and follow recommendations provided on product labels. Labels  contain instructions for safe and effective use of the cleaning product including precautions you should take when applying the product, such as wearing gloves or eye protection and making sure you have good ventilation during use of the product.  Remove gloves and wash hands immediately after cleaning.  Monitor yourself for signs and symptoms of illness Caregivers and household members are considered close contacts, should monitor their health, and will be asked to limit movement outside of the home to the extent possible. Follow the monitoring steps for close contacts listed on the symptom monitoring form.   ? If you have additional questions, contact your local health department or call the epidemiologist on call at [phone] (available 24/7). ? This guidance is subject to change. For the most up-to-date guidance from [facility], please refer to their website: YouBlogs.pl       Information on my medicine - XARELTO (Rivaroxaban)  This medication education was reviewed with me or my healthcare representative as part of my discharge preparation.    Why was Xarelto prescribed for you? Xarelto was prescribed for you to reduce the risk of a blood clot forming that can cause a stroke if you have a medical condition called atrial fibrillation (a type of irregular heartbeat).  What do you need to know about xarelto ? Take your Xarelto ONCE DAILY at the same time every day with your evening meal.  If you have difficulty swallowing the tablet whole, you may crush it and mix in applesauce just prior to taking your dose.  Take Xarelto exactly as prescribed by your doctor and DO NOT stop taking Xarelto without talking to the doctor who prescribed the medication.  Stopping without other stroke prevention medication to take the place of Xarelto may increase your risk of developing a clot that causes a stroke.  Refill your  prescription before you run out.  After discharge, you should have regular check-up appointments with your healthcare provider that is prescribing your Xarelto.  In the future your dose may need to be changed if your kidney function or weight changes by a significant amount.  What do you do if you miss a dose? If you are taking Xarelto ONCE DAILY and you miss a dose, take it as soon as you remember on the same day then continue your regularly scheduled once daily regimen the next day. Do not take two doses of Xarelto at the same time or on the same day.   Important Safety Information A possible side effect of Xarelto is bleeding. You should call your healthcare provider right away if you experience any of the following: ? Bleeding from an injury or your nose that does not stop. ? Unusual colored urine (red or dark brown) or unusual colored stools (red or black). ? Unusual bruising for unknown reasons. ? A serious fall or if you hit your head (even if there is no bleeding).  Some medicines may interact with Xarelto and might increase your risk of bleeding while on Xarelto. To help avoid this, consult your healthcare provider or pharmacist prior to using any new prescription or non-prescription medications, including herbals, vitamins, non-steroidal anti-inflammatory drugs (NSAIDs) and supplements.  This website has more information on Xarelto: [email or web address]

## 2020-07-11 NOTE — TOC Transition Note (Signed)
Transition of Care [facility]) - CM/SW Discharge Note   Patient Details  Name: Koralyn Prestage MRN: [id number] Date of Birth: 02/01/1931  [facility] CM/SW Contact:  Carles Collet, RN Phone Number: 07/11/2020, 11:53 AM   Clinical Narrative:   Damaris Schooner w patient's daughter, discussed DC plan for patient and spouse who is on 72[address]. She is happy that they both will be discharging back home today. She declined need for any additional DME in the home and was very glad that [facility] would be resuming care on her dad and was able to add her mother on for [facility] services as well. She will coordinate pick up of the patients with her sister who will also be providing supervision for the couple when she is at work.       Barriers to Discharge: Continued Medical Work up   Patient Goals and CMS Choice Patient states their goals for this hospitalization and ongoing recovery are:: to return home CMS Medicare.gov Compare Post Acute Care list provided to:: Patient Choice offered to / list presented to : Patient  Discharge Placement                       Discharge Plan and Services   Discharge Planning Services: CM Consult Post Acute Care Choice: Home Health                               Social Determinants of Health (SDOH) Interventions     Readmission Risk Interventions No flowsheet data found.

## 2020-07-11 NOTE — Progress Notes (Signed)
Discharge paperwork reviewed and given to pt. Pt verbalized understanding. Pt alert and oriented x4 upon discharge. Pt belongings taken with pt. Pt's daughter awaiting downstairs to transport pt home via private vehicle. Pt taken downstairs by a nurse via wheelchair.

## 2020-07-11 NOTE — Discharge Summary (Signed)
PATIENT DETAILS Name: Shannon [name] Age: 87 y.o. Sex: female Date of Birth: [birth year] MRN: [id number]. Admitting Physician: Norval Morton, MD PCP:Kim, Jeneen Rinks, MD  Admit Date: 07/07/2020 Discharge date: 07/11/2020  Recommendations for Outpatient Follow-up:  1. Follow up with PCP in 1-2 weeks 2. Please obtain CMP/CBC in one week 3. Repeat Chest Xray in 4-6 week  Admitted From:  Home   Disposition: Home with home health [facility]: Yes  Equipment/Devices: None  Discharge Condition: Stable  CODE STATUS: FULL CODE  Diet recommendation:  Diet Order            Diet - low sodium heart healthy           Diet Heart Room service appropriate? Yes; Fluid consistency: Thin  Diet effective now                  Brief Narrative: Patient is a 87 y.o. female with PMHx of chronic diastolic heart failure, atrial fibrillation on anticoagulation, HTN, CKD stage IIIb, HLD, gout,-who presented with shortness of breath-found to have acute hypoxic respiratory failure due to COVID-19 pneumonia.    Per patient-her spouse is also in the hospital due to COVID-19 pneumonia.  COVID-19 vaccinated status: Vaccinated-but not boosted.  Significant Events: 1/23>> Admit to [facility] for hypoxia due to COVID-19 pneumonia  Significant studies: 1/23>>Chest x-ray: Bilateral lung infiltrates 1/24>> bilateral lower extremity Doppler: No DVT  COVID-19 medications: Steroids:1/23>> Remdesivir:1/23>>  Antibiotics: None  Microbiology data: 1/23>>Blood culture: No growth  Procedures: None  Consults: None  Brief Hospital Course: Acute Hypoxic Resp Failure due to Covid 19 Viral pneumonia: Overall improved-has been titrated to room air for the past several days-CRP continues to downtrend-has completed a course of Remdesivir-we will remain on tapering steroids for the next few days.    COVID-19 Labs:  Recent Labs    07/09/20 0127 07/10/20 0027 07/11/20 0053   DDIMER 3.73* 3.60* 3.59*  FERRITIN 128  --   --   CRP 1.8* 1.2* 1.1*    Lab Results  Component Value Date   SARSCOV2NAA POSITIVE (A) 07/07/2020     Elevated D-dimer: Likely due to COVID-19 related inflammation-on chronic anticoagulation with Xarelto-lower extremity Doppler negative for DVT-since on Xarelto chronically-doubt further work-up required.    AKI on CKD stage IIIb:  Developed mild worsening in renal function-likely hemodynamically mediated-diuretic stopped-provided supportive care-has improved and started to downtrend.  Continue to hold diuretics for a few more days-renal ultrasound negative for hydronephrosis.  UA negative for proteinuria.  Repeat electrolytes in 1 week at PCPs office.  PAF: Maintaining sinus rhythm-continue pindolol-on Xarelto  Minimally elevated troponin: Due to demand ischemia-doubt ACS.  Chronic diastolic heart failure: Euvolemic on exam-continue to hold torsemide for a few more days.  Recheck/reassess electrolytes-volume status at PCPs office in 1 week.    HTN: BP stable-continue hydralazine, pindolol, Imdur  HLD: Continue Crestor  History of gout: No flare-continue colchicine and Uloric  Obesity: Estimated body mass index is 33.8 kg/m as calculated from the following:   Height as of this encounter: 5\' 7"  (1.702 m).   Weight as of this encounter: 97.9 kg.    Discharge Diagnoses:  Principal Problem:   Pneumonia due to COVID-19 virus Active Problems:   Paroxysmal atrial fibrillation (HCC)   Hyperlipidemia   Hypertension   Chronic diastolic CHF (congestive heart failure) (HCC)   Gout   Elevated troponin   CKD (chronic kidney disease), stage III [facility])   Discharge Instructions:  Person Under Monitoring Name: Shannon [name]  Location: [address] [place] [address]   Infection Prevention Recommendations for Individuals Confirmed to have, or Being Evaluated for, 2019 Novel Coronavirus (COVID-19) Infection Who Receive  Care at Home  Individuals who are confirmed to have, or are being evaluated for, COVID-19 should follow the prevention steps below until a healthcare provider or local or state health department says they can return to normal activities.  Stay home except to get medical care You should restrict activities outside your home, except for getting medical care. Do not go to work, school, or public areas, and do not use public transportation or taxis.  Call ahead before visiting your doctor Before your medical appointment, call the healthcare provider and tell them that you have, or are being evaluated for, COVID-19 infection. This will help the healthcare provider's office take steps to keep other people from getting infected. Ask your healthcare provider to call the local or state health department.  Monitor your symptoms Seek prompt medical attention if your illness is worsening (e.g., difficulty breathing). Before going to your medical appointment, call the healthcare provider and tell them that you have, or are being evaluated for, COVID-19 infection. Ask your healthcare provider to call the local or state health department.  Wear a facemask You should wear a facemask that covers your nose and mouth when you are in the same room with other people and when you visit a healthcare provider. People who live with or visit you should also wear a facemask while they are in the same room with you.  Separate yourself from other people in your home As much as possible, you should stay in a different room from other people in your home. Also, you should use a separate bathroom, if available.  Avoid sharing household items You should not share dishes, drinking glasses, cups, eating utensils, towels, bedding, or other items with other people in your home. After using these items, you should wash them thoroughly with soap and water.  Cover your coughs and sneezes Cover your mouth and nose with a  tissue when you cough or sneeze, or you can cough or sneeze into your sleeve. Throw used tissues in a lined trash can, and immediately wash your hands with soap and water for at least 20 seconds or use an alcohol-based hand rub.  Wash your [facility] your hands often and thoroughly with soap and water for at least 20 seconds. You can use an alcohol-based hand sanitizer if soap and water are not available and if your hands are not visibly dirty. Avoid touching your eyes, nose, and mouth with unwashed hands.   Prevention Steps for Caregivers and Household Members of Individuals Confirmed to have, or Being Evaluated for, COVID-19 Infection Being Cared for in the Home  If you live with, or provide care at home for, a person confirmed to have, or being evaluated for, COVID-19 infection please follow these guidelines to prevent infection:  Follow healthcare provider's instructions Make sure that you understand and can help the patient follow any healthcare provider instructions for all care.  Provide for the patient's basic needs You should help the patient with basic needs in the home and provide support for getting groceries, prescriptions, and other personal needs.  Monitor the patient's symptoms If they are getting sicker, call his or her medical provider and tell them that the patient has, or is being evaluated for, COVID-19 infection. This will help the healthcare provider's office take  steps to keep other people from getting infected. Ask the healthcare provider to call the local or state health department.  Limit the number of people who have contact with the patient  If possible, have only one caregiver for the patient.  Other household members should stay in another home or place of residence. If this is not possible, they should stay  in another room, or be separated from the patient as much as possible. Use a separate bathroom, if available.  Restrict visitors who do not  have an essential need to be in the home.  Keep older adults, very young children, and other sick people away from the patient Keep older adults, very young children, and those who have compromised immune systems or chronic health conditions away from the patient. This includes people with chronic heart, lung, or kidney conditions, diabetes, and cancer.  Ensure good ventilation Make sure that shared spaces in the home have good air flow, such as from an air conditioner or an opened window, weather permitting.  Wash your hands often  Wash your hands often and thoroughly with soap and water for at least 20 seconds. You can use an alcohol based hand sanitizer if soap and water are not available and if your hands are not visibly dirty.  Avoid touching your eyes, nose, and mouth with unwashed hands.  Use disposable paper towels to dry your hands. If not available, use dedicated cloth towels and replace them when they become wet.  Wear a facemask and gloves  Wear a disposable facemask at all times in the room and gloves when you touch or have contact with the patient's blood, body fluids, and/or secretions or excretions, such as sweat, saliva, sputum, nasal mucus, vomit, urine, or feces.  Ensure the mask fits over your nose and mouth tightly, and do not touch it during use.  Throw out disposable facemasks and gloves after using them. Do not reuse.  Wash your hands immediately after removing your facemask and gloves.  If your personal clothing becomes contaminated, carefully remove clothing and launder. Wash your hands after handling contaminated clothing.  Place all used disposable facemasks, gloves, and other waste in a lined container before disposing them with other household waste.  Remove gloves and wash your hands immediately after handling these items.  Do not share dishes, glasses, or other household items with the patient  Avoid sharing household items. You should not share  dishes, drinking glasses, cups, eating utensils, towels, bedding, or other items with a patient who is confirmed to have, or being evaluated for, COVID-19 infection.  After the person uses these items, you should wash them thoroughly with soap and water.  Wash laundry thoroughly  Immediately remove and wash clothes or bedding that have blood, body fluids, and/or secretions or excretions, such as sweat, saliva, sputum, nasal mucus, vomit, urine, or feces, on them.  Wear gloves when handling laundry from the patient.  Read and follow directions on labels of laundry or clothing items and detergent. In general, wash and dry with the warmest temperatures recommended on the label.  Clean all areas the individual has used often  Clean all touchable surfaces, such as counters, tabletops, doorknobs, bathroom fixtures, toilets, phones, keyboards, tablets, and bedside tables, every day. Also, clean any surfaces that may have blood, body fluids, and/or secretions or excretions on them.  Wear gloves when cleaning surfaces the patient has come in contact with.  Use a diluted bleach solution (e.g., dilute bleach with 1 part bleach  and 10 parts water) or a household disinfectant with a label that says EPA-registered for coronaviruses. To make a bleach solution at home, add 1 tablespoon of bleach to 1 quart (4 cups) of water. For a larger supply, add  cup of bleach to 1 gallon (16 cups) of water.  Read labels of cleaning products and follow recommendations provided on product labels. Labels contain instructions for safe and effective use of the cleaning product including precautions you should take when applying the product, such as wearing gloves or eye protection and making sure you have good ventilation during use of the product.  Remove gloves and wash hands immediately after cleaning.  Monitor yourself for signs and symptoms of illness Caregivers and household members are considered close contacts,  should monitor their health, and will be asked to limit movement outside of the home to the extent possible. Follow the monitoring steps for close contacts listed on the symptom monitoring form.   ? If you have additional questions, contact your local health department or call the epidemiologist on call at [phone] (available 24/7). ? This guidance is subject to change. For the most up-to-date guidance from CDC, please refer to their website: YouBlogs.pl    Activity:  As tolerated  Discharge Instructions    Call MD for:  difficulty breathing, headache or visual disturbances   Complete by: As directed    Diet - low sodium heart healthy   Complete by: As directed    Discharge instructions   Complete by: As directed    1.)  21 days of isolation from 1/23 or from the first day of your symptoms.  2.)  If you develop shortness of breath-please seek immediate medical attention  3.)  Hold diuretics/Demadex (fluid pills) for 2 more days-and resume on Sunday.  4.)  Please follow-up with your primary care practitioner next week for electrolyte/chemistry check to ensure that your kidney function remains stable and at baseline.   Follow with Primary MD  [name], MD in 1-2 weeks  Please get a complete blood count and chemistry panel checked by your Primary MD at your next visit, and again as instructed by your Primary MD.  Get Medicines reviewed and adjusted: Please take all your medications with you for your next visit with your Primary MD  Laboratory/radiological data: Please request your Primary MD to go over all hospital tests and procedure/radiological results at the follow up, please ask your Primary MD to get all Hospital records sent to his/her office.  In some cases, they will be blood work, cultures and biopsy results pending at the time of your discharge. Please request that your primary care M.D. follows up on  these results.  Also Note the following: If you experience worsening of your admission symptoms, develop shortness of breath, life threatening emergency, suicidal or homicidal thoughts you must seek medical attention immediately by calling 911 or calling your MD immediately  if symptoms less severe.  You must read complete instructions/literature along with all the possible adverse reactions/side effects for all the Medicines you take and that have been prescribed to you. Take any new Medicines after you have completely understood and accpet all the possible adverse reactions/side effects.   Do not drive when taking Pain medications or sleeping medications (Benzodaizepines)  Do not take more than prescribed Pain, Sleep and Anxiety Medications. It is not advisable to combine anxiety,sleep and pain medications without talking with your primary care practitioner  Special Instructions: If you have smoked or chewed  Tobacco  in the last 2 yrs please stop smoking, stop any regular Alcohol  and or any Recreational drug use.  Wear Seat belts while driving.  Please note: You were cared for by a hospitalist during your hospital stay. Once you are discharged, your primary care physician will handle any further medical issues. Please note that NO REFILLS for any discharge medications will be authorized once you are discharged, as it is imperative that you return to your primary care physician (or establish a relationship with a primary care physician if you do not have one) for your post hospital discharge needs so that they can reassess your need for medications and monitor your lab values.   Increase activity slowly   Complete by: As directed      Allergies as of 07/11/2020      Reactions   Amlodipine Swelling   Intolerance, LE edema    Zestoretic [lisinopril-hydrochlorothiazide] Itching, Rash      Medication List    STOP taking these medications   metolazone 5 MG tablet Commonly known as:  ZAROXOLYN     TAKE these medications   albuterol 108 (90 Base) MCG/ACT inhaler Commonly known as: VENTOLIN HFA Inhale 2 puffs into the lungs every 6 (six) hours as needed for wheezing or shortness of breath.   benzonatate 100 MG capsule Commonly known as: Tessalon Perles Take 1 capsule (100 mg total) by mouth every 6 (six) hours as needed for cough.   colchicine 0.6 MG tablet Take 0.6 mg by mouth daily.   Febuxostat 80 MG Tabs Take 0.5 tablets by mouth daily.   hydrALAZINE 100 MG tablet Commonly known as: APRESOLINE Take 100 mg by mouth 3 (three) times daily.   isosorbide dinitrate 30 MG tablet Commonly known as: ISORDIL TAKE 1 TABLET(30 MG) BY MOUTH THREE TIMES DAILY   pindolol 5 MG tablet Commonly known as: VISKEN TAKE 1 TABLET(5 MG) BY MOUTH TWICE DAILY   Potassium Chloride ER 20 MEQ Tbcr Take 1 tablet by mouth daily.   predniSONE 10 MG tablet Commonly known as: DELTASONE Take 40 mg daily for 1 day, 30 mg daily for 1 day, 20 mg daily for 1 days,10 mg daily for 1 day, then stop   rosuvastatin 20 MG tablet Commonly known as: CRESTOR Take 20 mg by mouth every evening.   torsemide 20 MG tablet Commonly known as: DEMADEX Take 2 tablets (40 mg total) by mouth 2 (two) times daily at 10 am and 4 pm. Two tablets BID for 3 days only starting 09/19/17 Start taking on: July 14, 2020 What changed: These instructions start on July 14, 2020. If you are unsure what to do until then, ask your doctor or other care provider.   Xarelto 15 MG Tabs tablet Generic drug: Rivaroxaban TAKE 1 TABLET BY MOUTH EVERY EVENING AFTER DINNER       Follow-up Information    [name], MD. Schedule an appointment as soon as possible for a visit in 1 week(s).   Specialty: Internal Medicine Contact information: [address] [place] [address] [phone]              Allergies  Allergen Reactions  . Amlodipine Swelling    Intolerance, LE edema   .  Zestoretic [Lisinopril-Hydrochlorothiazide] Itching and Rash    Other Procedures/Studies: US RENAL  Result Date: 07/10/2020 CLINICAL DATA:  Acute kidney injury EXAM: RENAL / URINARY TRACT ULTRASOUND COMPLETE COMPARISON:  None. FINDINGS: Right Kidney: Renal measurements: 9.7 x 3.3 x  3.7 cm = volume: 61 mL. Diffuse parenchymal thinning. No mass or hydronephrosis identified. Left Kidney: Renal measurements: 11.2 x 3.9 x 3.9 cm = volume: 90 mL. Diffuse parenchymal thinning. No mass or hydronephrosis identified. Bladder: Appears normal for degree of bladder distention. Other: Examination is somewhat technically limited, likely due to body habitus and overlying bowel gas. IMPRESSION: 1. Bilateral renal parenchymal thinning suggesting chronic medical renal disease. No hydronephrosis. 2. Normal appearance of the bladder. Electronically Signed   By: Lucienne Capers M.D.   On: 07/10/2020 22:26   DG Chest Portable 1 View  Result Date: 07/07/2020 CLINICAL DATA:  Shortness of breath. EXAM: PORTABLE CHEST 1 VIEW COMPARISON:  March 11, 2018 FINDINGS: The mediastinal contour and cardiac silhouette are stable. The heart size enlarged. Patchy consolidation of bilateral lung bases are noted. There is no pulmonary edema. Degenerative joint changes of bilateral shoulders are noted. IMPRESSION: Bilateral lung base pneumonias. Electronically Signed   By: Abelardo Diesel M.D.   On: 07/07/2020 08:47   VAS [place] LOWER EXTREMITY VENOUS (DVT)  Result Date: 07/09/2020  Lower Venous DVT Study Other Indications: Swelling, hypoxia, elevated d-dimer, and covid+. Limitations: Body habitus, poor ultrasound/tissue interface and Patient inability to cooperate. Comparison Study: No prior exams Performing Technologist: Rogelia Rohrer  Examination Guidelines: A complete evaluation includes B-mode imaging, spectral Doppler, color Doppler, and power Doppler as needed of all accessible portions of each vessel. Bilateral testing is considered an  integral part of a complete examination. Limited examinations for reoccurring indications may be performed as noted. The reflux portion of the exam is performed with the patient in reverse Trendelenburg.  +---------+---------------+---------+-----------+----------+--------------+ RIGHT    CompressibilityPhasicitySpontaneityPropertiesThrombus Aging +---------+---------------+---------+-----------+----------+--------------+ CFV      Full           Yes      Yes                                 +---------+---------------+---------+-----------+----------+--------------+ SFJ      Full                                                        +---------+---------------+---------+-----------+----------+--------------+ FV Prox  Full           Yes      Yes                                 +---------+---------------+---------+-----------+----------+--------------+ FV Mid   Full           Yes      Yes                                 +---------+---------------+---------+-----------+----------+--------------+ FV DistalFull           Yes      Yes                                 +---------+---------------+---------+-----------+----------+--------------+ PFV      Full                                                        +---------+---------------+---------+-----------+----------+--------------+  POP      Full           Yes      Yes                                 +---------+---------------+---------+-----------+----------+--------------+ PTV      Full                                                        +---------+---------------+---------+-----------+----------+--------------+ PERO     Full                                                        +---------+---------------+---------+-----------+----------+--------------+   +---------+---------------+---------+-----------+----------+--------------+ LEFT     CompressibilityPhasicitySpontaneityPropertiesThrombus  Aging +---------+---------------+---------+-----------+----------+--------------+ CFV      Full           Yes      Yes                                 +---------+---------------+---------+-----------+----------+--------------+ SFJ      Full                                                        +---------+---------------+---------+-----------+----------+--------------+ FV Prox  Full           Yes      Yes                                 +---------+---------------+---------+-----------+----------+--------------+ FV Mid   Full           Yes      Yes                                 +---------+---------------+---------+-----------+----------+--------------+ FV DistalFull           Yes      Yes                                 +---------+---------------+---------+-----------+----------+--------------+ PFV      Full                                                        +---------+---------------+---------+-----------+----------+--------------+ POP      Full           Yes      Yes                                 +---------+---------------+---------+-----------+----------+--------------+ PTV  Full                                                        +---------+---------------+---------+-----------+----------+--------------+ PERO     Full                                                        +---------+---------------+---------+-----------+----------+--------------+     Summary: BILATERAL: - No evidence of deep vein thrombosis seen in the lower extremities, bilaterally. -No evidence of popliteal cyst, bilaterally.   *See table(s) above for measurements and observations. Electronically signed by Deitra Mayo MD on 07/09/2020 at 7:48:05 AM.    Final      TODAY-DAY OF DISCHARGE:  Subjective:   Lella Mullany today has no headache,no chest abdominal pain,no new weakness tingling or numbness, feels much better wants to go home today.   Objective:    Blood pressure 132/61, pulse 69, temperature 97.9 F (36.6 C), temperature source Oral, resp. rate (!) 21, height 5\' 7"  (1.702 m), weight 97.9 kg, SpO2 100 %. No intake or output data in the 24 hours ending 07/11/20 1116 Filed Weights   07/07/20 2127 07/10/20 2007 07/11/20 0234  Weight: 97.9 kg 97.9 kg 97.9 kg    Exam: Awake Alert, Oriented *3, No new F.N deficits, Normal affect [place].AT,PERRAL Supple Neck,No JVD, No cervical lymphadenopathy appriciated.  Symmetrical Chest wall movement, Good air movement bilaterally, CTAB RRR,No Gallops,Rubs or new Murmurs, No Parasternal Heave +ve B.Sounds, Abd Soft, Non tender, No organomegaly appriciated, No rebound -guarding or rigidity. No Cyanosis, Clubbing or edema, No new Rash or bruise   PERTINENT RADIOLOGIC STUDIES: US RENAL  Result Date: 07/10/2020 CLINICAL DATA:  Acute kidney injury EXAM: RENAL / URINARY TRACT ULTRASOUND COMPLETE COMPARISON:  None. FINDINGS: Right Kidney: Renal measurements: 9.7 x 3.3 x 3.7 cm = volume: 61 mL. Diffuse parenchymal thinning. No mass or hydronephrosis identified. Left Kidney: Renal measurements: 11.2 x 3.9 x 3.9 cm = volume: 90 mL. Diffuse parenchymal thinning. No mass or hydronephrosis identified. Bladder: Appears normal for degree of bladder distention. Other: Examination is somewhat technically limited, likely due to body habitus and overlying bowel gas. IMPRESSION: 1. Bilateral renal parenchymal thinning suggesting chronic medical renal disease. No hydronephrosis. 2. Normal appearance of the bladder. Electronically Signed   By: Lucienne Capers M.D.   On: 07/10/2020 22:26   DG Chest Portable 1 View  Result Date: 07/07/2020 CLINICAL DATA:  Shortness of breath. EXAM: PORTABLE CHEST 1 VIEW COMPARISON:  March 11, 2018 FINDINGS: The mediastinal contour and cardiac silhouette are stable. The heart size enlarged. Patchy consolidation of bilateral lung bases are noted. There is no pulmonary edema. Degenerative  joint changes of bilateral shoulders are noted. IMPRESSION: Bilateral lung base pneumonias. Electronically Signed   By: Abelardo Diesel M.D.   On: 07/07/2020 08:47   VAS [place] LOWER EXTREMITY VENOUS (DVT)  Result Date: 07/09/2020  Lower Venous DVT Study Other Indications: Swelling, hypoxia, elevated d-dimer, and covid+. Limitations: Body habitus, poor ultrasound/tissue interface and Patient inability to cooperate. Comparison Study: No prior exams Performing Technologist: Rogelia Rohrer  Examination Guidelines: A complete evaluation includes B-mode imaging, spectral Doppler, color Doppler, and power Doppler  as needed of all accessible portions of each vessel. Bilateral testing is considered an integral part of a complete examination. Limited examinations for reoccurring indications may be performed as noted. The reflux portion of the exam is performed with the patient in reverse Trendelenburg.  +---------+---------------+---------+-----------+----------+--------------+ RIGHT    CompressibilityPhasicitySpontaneityPropertiesThrombus Aging +---------+---------------+---------+-----------+----------+--------------+ CFV      Full           Yes      Yes                                 +---------+---------------+---------+-----------+----------+--------------+ SFJ      Full                                                        +---------+---------------+---------+-----------+----------+--------------+ FV Prox  Full           Yes      Yes                                 +---------+---------------+---------+-----------+----------+--------------+ FV Mid   Full           Yes      Yes                                 +---------+---------------+---------+-----------+----------+--------------+ FV DistalFull           Yes      Yes                                 +---------+---------------+---------+-----------+----------+--------------+ PFV      Full                                                         +---------+---------------+---------+-----------+----------+--------------+ POP      Full           Yes      Yes                                 +---------+---------------+---------+-----------+----------+--------------+ PTV      Full                                                        +---------+---------------+---------+-----------+----------+--------------+ PERO     Full                                                        +---------+---------------+---------+-----------+----------+--------------+   +---------+---------------+---------+-----------+----------+--------------+ LEFT     CompressibilityPhasicitySpontaneityPropertiesThrombus Aging +---------+---------------+---------+-----------+----------+--------------+ CFV      Full  Yes      Yes                                 +---------+---------------+---------+-----------+----------+--------------+ SFJ      Full                                                        +---------+---------------+---------+-----------+----------+--------------+ FV Prox  Full           Yes      Yes                                 +---------+---------------+---------+-----------+----------+--------------+ FV Mid   Full           Yes      Yes                                 +---------+---------------+---------+-----------+----------+--------------+ FV DistalFull           Yes      Yes                                 +---------+---------------+---------+-----------+----------+--------------+ PFV      Full                                                        +---------+---------------+---------+-----------+----------+--------------+ POP      Full           Yes      Yes                                 +---------+---------------+---------+-----------+----------+--------------+ PTV      Full                                                         +---------+---------------+---------+-----------+----------+--------------+ PERO     Full                                                        +---------+---------------+---------+-----------+----------+--------------+     Summary: BILATERAL: - No evidence of deep vein thrombosis seen in the lower extremities, bilaterally. -No evidence of popliteal cyst, bilaterally.   *See table(s) above for measurements and observations. Electronically signed by Deitra Mayo MD on 07/09/2020 at 7:48:05 AM.    Final      PERTINENT LAB RESULTS: CBC: Recent Labs    07/10/20 0027 07/11/20 0053  WBC 9.5 9.1  HGB 12.1 12.3  HCT 38.5 37.9  PLT 338 281   CMET CMP  Component Value Date/Time   NA 138 07/11/2020 0053   K 4.6 07/11/2020 0053   CL 108 07/11/2020 0053   CO2 21 (L) 07/11/2020 0053   GLUCOSE 155 (H) 07/11/2020 0053   BUN 52 (H) 07/11/2020 0053   CREATININE 1.85 (H) 07/11/2020 0053   CALCIUM 8.6 (L) 07/11/2020 0053   PROT 6.3 (L) 07/11/2020 0053   ALBUMIN 2.8 (L) 07/11/2020 0053   AST 27 07/11/2020 0053   ALT 19 07/11/2020 0053   ALKPHOS 58 07/11/2020 0053   BILITOT 0.7 07/11/2020 0053   GFRNONAA 26 (L) 07/11/2020 0053   GFRAA 38 (L) 03/11/2018 0354    GFR Estimated Creatinine Clearance: 25.7 mL/min (A) (by C-G formula based on SCr of 1.85 mg/dL (H)). No results for input(s): LIPASE, AMYLASE in the last 72 hours. No results for input(s): CKTOTAL, CKMB, CKMBINDEX, TROPONINI in the last 72 hours. Invalid input(s): [place]    07/10/20 0027 07/11/20 0053  DDIMER 3.60* 3.59*   No results for input(s): HGBA1C in the last 72 hours. No results for input(s): CHOL, HDL, LDLCALC, TRIG, CHOLHDL, LDLDIRECT in the last 72 hours. No results for input(s): TSH, T4TOTAL, T3FREE, THYROIDAB in the last 72 hours.  Invalid input(s): FREET3 Recent Labs    07/09/20 0127  FERRITIN 128   Coags: No results for input(s): INR in the last 72 hours.  Invalid input(s):  PT Microbiology: Recent Results (from the past 240 hour(s))  SARS Coronavirus 2 by RT PCR (hospital order, performed in [facility] hospital lab) Nasopharyngeal Nasopharyngeal Swab     Status: Abnormal   Collection Time: 07/07/20  8:16 AM   Specimen: Nasopharyngeal Swab  Result Value Ref Range Status   SARS Coronavirus 2 POSITIVE (A) NEGATIVE Final    Comment: RESULT CALLED TO, READ BACK BY AND VERIFIED WITH: RN Richarda Blade [phone] MLM (NOTE) SARS-CoV-2 target nucleic acids are DETECTED  SARS-CoV-2 RNA is generally detectable in upper respiratory specimens  during the acute phase of infection.  Positive results are indicative  of the presence of the identified virus, but do not rule out bacterial infection or co-infection with other pathogens not detected by the test.  Clinical correlation with patient history and  other diagnostic information is necessary to determine patient infection status.  The expected result is negative.  Fact Sheet for Patients:   StrictlyIdeas.no   Fact Sheet for Healthcare Providers:   BankingDealers.co.za    This test is not yet approved or cleared by the [place] FDA and  has been authorized for detection and/or diagnosis of SARS-CoV-2 by FDA under an Emergency Use Authorization (EUA).  This EUA will remain in effect (meaning this test can b e used) for the duration of  the COVID-19 declaration under Section 564(b)(1) of the Act, 21 U.S.C. section 360-bbb-3(b)(1), unless the authorization is terminated or revoked sooner.  Performed at [facility], [place] [address] [place], [name] [address]   Blood Culture (routine x 2)     Status: None (Preliminary result)   Collection Time: 07/07/20 10:19 AM   Specimen: BLOOD  Result Value Ref Range Status   Specimen Description BLOOD BLOOD RIGHT FOREARM  Final   Special Requests   Final    BOTTLES DRAWN AEROBIC AND ANAEROBIC Blood Culture results may  not be optimal due to an inadequate volume of blood received in culture bottles   Culture   Final    NO GROWTH 3 DAYS Performed at [facility], [address], [place], [place] [address]    Report Status PENDING  Incomplete  Blood Culture (routine x 2)     Status: None (Preliminary result)   Collection Time: 07/07/20 10:24 AM   Specimen: BLOOD  Result Value Ref Range Status   Specimen Description BLOOD LEFT ANTECUBITAL  Final   Special Requests   Final    BOTTLES DRAWN AEROBIC AND ANAEROBIC Blood Culture adequate volume   Culture   Final    NO GROWTH 3 DAYS Performed at [facility], [place] [address] [place], [place] [address]    Report Status PENDING  Incomplete    FURTHER DISCHARGE INSTRUCTIONS:  Get Medicines reviewed and adjusted: Please take all your medications with you for your next visit with your Primary MD  Laboratory/radiological data: Please request your Primary MD to go over all hospital tests and procedure/radiological results at the follow up, please ask your Primary MD to get all Hospital records sent to his/her office.  In some cases, they will be blood work, cultures and biopsy results pending at the time of your discharge. Please request that your primary care M.D. goes through all the records of your hospital data and follows up on these results.  Also Note the following: If you experience worsening of your admission symptoms, develop shortness of breath, life threatening emergency, suicidal or homicidal thoughts you must seek medical attention immediately by calling 911 or calling your MD immediately  if symptoms less severe.  You must read complete instructions/literature along with all the possible adverse reactions/side effects for all the Medicines you take and that have been prescribed to you. Take any new Medicines after you have completely understood and accpet all the possible adverse reactions/side effects.   Do not drive when taking Pain  medications or sleeping medications (Benzodaizepines)  Do not take more than prescribed Pain, Sleep and Anxiety Medications. It is not advisable to combine anxiety,sleep and pain medications without talking with your primary care practitioner  Special Instructions: If you have smoked or chewed Tobacco  in the last 2 yrs please stop smoking, stop any regular Alcohol  and or any Recreational drug use.  Wear Seat belts while driving.  Please note: You were cared for by a hospitalist during your hospital stay. Once you are discharged, your primary care physician will handle any further medical issues. Please note that NO REFILLS for any discharge medications will be authorized once you are discharged, as it is imperative that you return to your primary care physician (or establish a relationship with a primary care physician if you do not have one) for your post hospital discharge needs so that they can reassess your need for medications and monitor your lab values.  Total Time spent coordinating discharge including counseling, education and face to face time equals 35 minutes.  SignedOren Binet 07/11/2020 11:16 AM

## 2020-07-11 NOTE — Care Management Important Message (Signed)
Important Message  Patient Details  Name: [name] MRN: [id number] Date of Birth: Nov 09, 1930   Medicare Important Message Given:  Yes - Important Message mailed due to current National Emergency   Verbal consent obtained due to current National Emergency  Relationship to patient: Self Contact Name: Alysabeth Scalia Call Date: 07/11/20  Time: 1159 Phone: [phone] Outcome: No Answer/Busy Important Message mailed to: Patient address on file    Delorse Lek 07/11/2020, 1:53 PM

## 2020-07-12 LAB — CULTURE, BLOOD (ROUTINE X 2)
Culture: NO GROWTH
Culture: NO GROWTH
Special Requests: ADEQUATE

## 2020-07-17 DIAGNOSIS — Z7901 Long term (current) use of anticoagulants: Secondary | ICD-10-CM | POA: Diagnosis not present

## 2020-07-17 DIAGNOSIS — M1030 Gout due to renal impairment, unspecified site: Secondary | ICD-10-CM | POA: Diagnosis not present

## 2020-07-17 DIAGNOSIS — N179 Acute kidney failure, unspecified: Secondary | ICD-10-CM | POA: Diagnosis not present

## 2020-07-17 DIAGNOSIS — J1282 Pneumonia due to coronavirus disease 2019: Secondary | ICD-10-CM | POA: Diagnosis not present

## 2020-07-17 DIAGNOSIS — J9601 Acute respiratory failure with hypoxia: Secondary | ICD-10-CM | POA: Diagnosis not present

## 2020-07-17 DIAGNOSIS — I130 Hypertensive heart and chronic kidney disease with heart failure and stage 1 through stage 4 chronic kidney disease, or unspecified chronic kidney disease: Secondary | ICD-10-CM | POA: Diagnosis not present

## 2020-07-17 DIAGNOSIS — I5032 Chronic diastolic (congestive) heart failure: Secondary | ICD-10-CM | POA: Diagnosis not present

## 2020-07-17 DIAGNOSIS — U071 COVID-19: Secondary | ICD-10-CM | POA: Diagnosis not present

## 2020-07-17 DIAGNOSIS — N1832 Chronic kidney disease, stage 3b: Secondary | ICD-10-CM | POA: Diagnosis not present

## 2020-07-17 DIAGNOSIS — I480 Paroxysmal atrial fibrillation: Secondary | ICD-10-CM | POA: Diagnosis not present

## 2020-07-17 DIAGNOSIS — Z7952 Long term (current) use of systemic steroids: Secondary | ICD-10-CM | POA: Diagnosis not present

## 2020-07-18 DIAGNOSIS — I5032 Chronic diastolic (congestive) heart failure: Secondary | ICD-10-CM | POA: Diagnosis not present

## 2020-07-18 DIAGNOSIS — J1282 Pneumonia due to coronavirus disease 2019: Secondary | ICD-10-CM | POA: Diagnosis not present

## 2020-07-18 DIAGNOSIS — M1030 Gout due to renal impairment, unspecified site: Secondary | ICD-10-CM | POA: Diagnosis not present

## 2020-07-18 DIAGNOSIS — N1832 Chronic kidney disease, stage 3b: Secondary | ICD-10-CM | POA: Diagnosis not present

## 2020-07-18 DIAGNOSIS — N179 Acute kidney failure, unspecified: Secondary | ICD-10-CM | POA: Diagnosis not present

## 2020-07-18 DIAGNOSIS — I480 Paroxysmal atrial fibrillation: Secondary | ICD-10-CM | POA: Diagnosis not present

## 2020-07-18 DIAGNOSIS — I130 Hypertensive heart and chronic kidney disease with heart failure and stage 1 through stage 4 chronic kidney disease, or unspecified chronic kidney disease: Secondary | ICD-10-CM | POA: Diagnosis not present

## 2020-07-18 DIAGNOSIS — U071 COVID-19: Secondary | ICD-10-CM | POA: Diagnosis not present

## 2020-07-18 DIAGNOSIS — J9601 Acute respiratory failure with hypoxia: Secondary | ICD-10-CM | POA: Diagnosis not present

## 2020-07-18 DIAGNOSIS — Z7901 Long term (current) use of anticoagulants: Secondary | ICD-10-CM | POA: Diagnosis not present

## 2020-07-18 DIAGNOSIS — Z7952 Long term (current) use of systemic steroids: Secondary | ICD-10-CM | POA: Diagnosis not present

## 2020-07-22 DIAGNOSIS — I130 Hypertensive heart and chronic kidney disease with heart failure and stage 1 through stage 4 chronic kidney disease, or unspecified chronic kidney disease: Secondary | ICD-10-CM | POA: Diagnosis not present

## 2020-07-22 DIAGNOSIS — N1832 Chronic kidney disease, stage 3b: Secondary | ICD-10-CM | POA: Diagnosis not present

## 2020-07-22 DIAGNOSIS — J1282 Pneumonia due to coronavirus disease 2019: Secondary | ICD-10-CM | POA: Diagnosis not present

## 2020-07-22 DIAGNOSIS — I5032 Chronic diastolic (congestive) heart failure: Secondary | ICD-10-CM | POA: Diagnosis not present

## 2020-07-22 DIAGNOSIS — Z7901 Long term (current) use of anticoagulants: Secondary | ICD-10-CM | POA: Diagnosis not present

## 2020-07-22 DIAGNOSIS — U071 COVID-19: Secondary | ICD-10-CM | POA: Diagnosis not present

## 2020-07-22 DIAGNOSIS — Z7952 Long term (current) use of systemic steroids: Secondary | ICD-10-CM | POA: Diagnosis not present

## 2020-07-22 DIAGNOSIS — I480 Paroxysmal atrial fibrillation: Secondary | ICD-10-CM | POA: Diagnosis not present

## 2020-07-22 DIAGNOSIS — J9601 Acute respiratory failure with hypoxia: Secondary | ICD-10-CM | POA: Diagnosis not present

## 2020-07-22 DIAGNOSIS — N179 Acute kidney failure, unspecified: Secondary | ICD-10-CM | POA: Diagnosis not present

## 2020-07-22 DIAGNOSIS — M1030 Gout due to renal impairment, unspecified site: Secondary | ICD-10-CM | POA: Diagnosis not present

## 2020-07-30 DIAGNOSIS — Z7901 Long term (current) use of anticoagulants: Secondary | ICD-10-CM | POA: Diagnosis not present

## 2020-07-30 DIAGNOSIS — M1030 Gout due to renal impairment, unspecified site: Secondary | ICD-10-CM | POA: Diagnosis not present

## 2020-07-30 DIAGNOSIS — J9601 Acute respiratory failure with hypoxia: Secondary | ICD-10-CM | POA: Diagnosis not present

## 2020-07-30 DIAGNOSIS — N1832 Chronic kidney disease, stage 3b: Secondary | ICD-10-CM | POA: Diagnosis not present

## 2020-07-30 DIAGNOSIS — I5032 Chronic diastolic (congestive) heart failure: Secondary | ICD-10-CM | POA: Diagnosis not present

## 2020-07-30 DIAGNOSIS — J1282 Pneumonia due to coronavirus disease 2019: Secondary | ICD-10-CM | POA: Diagnosis not present

## 2020-07-30 DIAGNOSIS — I480 Paroxysmal atrial fibrillation: Secondary | ICD-10-CM | POA: Diagnosis not present

## 2020-07-30 DIAGNOSIS — I130 Hypertensive heart and chronic kidney disease with heart failure and stage 1 through stage 4 chronic kidney disease, or unspecified chronic kidney disease: Secondary | ICD-10-CM | POA: Diagnosis not present

## 2020-07-30 DIAGNOSIS — N179 Acute kidney failure, unspecified: Secondary | ICD-10-CM | POA: Diagnosis not present

## 2020-07-30 DIAGNOSIS — Z7952 Long term (current) use of systemic steroids: Secondary | ICD-10-CM | POA: Diagnosis not present

## 2020-07-30 DIAGNOSIS — U071 COVID-19: Secondary | ICD-10-CM | POA: Diagnosis not present

## 2020-08-01 DIAGNOSIS — I480 Paroxysmal atrial fibrillation: Secondary | ICD-10-CM | POA: Diagnosis not present

## 2020-08-01 DIAGNOSIS — I5032 Chronic diastolic (congestive) heart failure: Secondary | ICD-10-CM | POA: Diagnosis not present

## 2020-08-01 DIAGNOSIS — M1030 Gout due to renal impairment, unspecified site: Secondary | ICD-10-CM | POA: Diagnosis not present

## 2020-08-01 DIAGNOSIS — N179 Acute kidney failure, unspecified: Secondary | ICD-10-CM | POA: Diagnosis not present

## 2020-08-01 DIAGNOSIS — Z7901 Long term (current) use of anticoagulants: Secondary | ICD-10-CM | POA: Diagnosis not present

## 2020-08-01 DIAGNOSIS — J9601 Acute respiratory failure with hypoxia: Secondary | ICD-10-CM | POA: Diagnosis not present

## 2020-08-01 DIAGNOSIS — J1282 Pneumonia due to coronavirus disease 2019: Secondary | ICD-10-CM | POA: Diagnosis not present

## 2020-08-01 DIAGNOSIS — Z7952 Long term (current) use of systemic steroids: Secondary | ICD-10-CM | POA: Diagnosis not present

## 2020-08-01 DIAGNOSIS — U071 COVID-19: Secondary | ICD-10-CM | POA: Diagnosis not present

## 2020-08-01 DIAGNOSIS — N1832 Chronic kidney disease, stage 3b: Secondary | ICD-10-CM | POA: Diagnosis not present

## 2020-08-01 DIAGNOSIS — I130 Hypertensive heart and chronic kidney disease with heart failure and stage 1 through stage 4 chronic kidney disease, or unspecified chronic kidney disease: Secondary | ICD-10-CM | POA: Diagnosis not present

## 2020-08-06 DIAGNOSIS — N179 Acute kidney failure, unspecified: Secondary | ICD-10-CM | POA: Diagnosis not present

## 2020-08-06 DIAGNOSIS — M1030 Gout due to renal impairment, unspecified site: Secondary | ICD-10-CM | POA: Diagnosis not present

## 2020-08-06 DIAGNOSIS — N1832 Chronic kidney disease, stage 3b: Secondary | ICD-10-CM | POA: Diagnosis not present

## 2020-08-06 DIAGNOSIS — I480 Paroxysmal atrial fibrillation: Secondary | ICD-10-CM | POA: Diagnosis not present

## 2020-08-06 DIAGNOSIS — J9601 Acute respiratory failure with hypoxia: Secondary | ICD-10-CM | POA: Diagnosis not present

## 2020-08-06 DIAGNOSIS — I130 Hypertensive heart and chronic kidney disease with heart failure and stage 1 through stage 4 chronic kidney disease, or unspecified chronic kidney disease: Secondary | ICD-10-CM | POA: Diagnosis not present

## 2020-08-06 DIAGNOSIS — I5032 Chronic diastolic (congestive) heart failure: Secondary | ICD-10-CM | POA: Diagnosis not present

## 2020-08-06 DIAGNOSIS — J1282 Pneumonia due to coronavirus disease 2019: Secondary | ICD-10-CM | POA: Diagnosis not present

## 2020-08-06 DIAGNOSIS — U071 COVID-19: Secondary | ICD-10-CM | POA: Diagnosis not present

## 2020-08-06 DIAGNOSIS — Z7901 Long term (current) use of anticoagulants: Secondary | ICD-10-CM | POA: Diagnosis not present

## 2020-08-06 DIAGNOSIS — Z7952 Long term (current) use of systemic steroids: Secondary | ICD-10-CM | POA: Diagnosis not present

## 2020-08-09 DIAGNOSIS — I480 Paroxysmal atrial fibrillation: Secondary | ICD-10-CM | POA: Diagnosis not present

## 2020-08-09 DIAGNOSIS — Z7952 Long term (current) use of systemic steroids: Secondary | ICD-10-CM | POA: Diagnosis not present

## 2020-08-09 DIAGNOSIS — Z7901 Long term (current) use of anticoagulants: Secondary | ICD-10-CM | POA: Diagnosis not present

## 2020-08-09 DIAGNOSIS — I130 Hypertensive heart and chronic kidney disease with heart failure and stage 1 through stage 4 chronic kidney disease, or unspecified chronic kidney disease: Secondary | ICD-10-CM | POA: Diagnosis not present

## 2020-08-09 DIAGNOSIS — U071 COVID-19: Secondary | ICD-10-CM | POA: Diagnosis not present

## 2020-08-09 DIAGNOSIS — N1832 Chronic kidney disease, stage 3b: Secondary | ICD-10-CM | POA: Diagnosis not present

## 2020-08-09 DIAGNOSIS — J1282 Pneumonia due to coronavirus disease 2019: Secondary | ICD-10-CM | POA: Diagnosis not present

## 2020-08-09 DIAGNOSIS — J9601 Acute respiratory failure with hypoxia: Secondary | ICD-10-CM | POA: Diagnosis not present

## 2020-08-09 DIAGNOSIS — N179 Acute kidney failure, unspecified: Secondary | ICD-10-CM | POA: Diagnosis not present

## 2020-08-09 DIAGNOSIS — I5032 Chronic diastolic (congestive) heart failure: Secondary | ICD-10-CM | POA: Diagnosis not present

## 2020-08-09 DIAGNOSIS — M1030 Gout due to renal impairment, unspecified site: Secondary | ICD-10-CM | POA: Diagnosis not present

## 2020-08-12 DIAGNOSIS — N183 Chronic kidney disease, stage 3 unspecified: Secondary | ICD-10-CM | POA: Diagnosis not present

## 2020-08-15 DIAGNOSIS — J9601 Acute respiratory failure with hypoxia: Secondary | ICD-10-CM | POA: Diagnosis not present

## 2020-08-15 DIAGNOSIS — Z7952 Long term (current) use of systemic steroids: Secondary | ICD-10-CM | POA: Diagnosis not present

## 2020-08-15 DIAGNOSIS — J1282 Pneumonia due to coronavirus disease 2019: Secondary | ICD-10-CM | POA: Diagnosis not present

## 2020-08-15 DIAGNOSIS — I130 Hypertensive heart and chronic kidney disease with heart failure and stage 1 through stage 4 chronic kidney disease, or unspecified chronic kidney disease: Secondary | ICD-10-CM | POA: Diagnosis not present

## 2020-08-15 DIAGNOSIS — Z7901 Long term (current) use of anticoagulants: Secondary | ICD-10-CM | POA: Diagnosis not present

## 2020-08-15 DIAGNOSIS — I480 Paroxysmal atrial fibrillation: Secondary | ICD-10-CM | POA: Diagnosis not present

## 2020-08-15 DIAGNOSIS — I5032 Chronic diastolic (congestive) heart failure: Secondary | ICD-10-CM | POA: Diagnosis not present

## 2020-08-15 DIAGNOSIS — M1030 Gout due to renal impairment, unspecified site: Secondary | ICD-10-CM | POA: Diagnosis not present

## 2020-08-15 DIAGNOSIS — U071 COVID-19: Secondary | ICD-10-CM | POA: Diagnosis not present

## 2020-08-15 DIAGNOSIS — N1832 Chronic kidney disease, stage 3b: Secondary | ICD-10-CM | POA: Diagnosis not present

## 2020-08-15 DIAGNOSIS — N179 Acute kidney failure, unspecified: Secondary | ICD-10-CM | POA: Diagnosis not present

## 2020-08-21 DIAGNOSIS — I130 Hypertensive heart and chronic kidney disease with heart failure and stage 1 through stage 4 chronic kidney disease, or unspecified chronic kidney disease: Secondary | ICD-10-CM | POA: Diagnosis not present

## 2020-08-21 DIAGNOSIS — J1282 Pneumonia due to coronavirus disease 2019: Secondary | ICD-10-CM | POA: Diagnosis not present

## 2020-08-21 DIAGNOSIS — Z7901 Long term (current) use of anticoagulants: Secondary | ICD-10-CM | POA: Diagnosis not present

## 2020-08-21 DIAGNOSIS — M1030 Gout due to renal impairment, unspecified site: Secondary | ICD-10-CM | POA: Diagnosis not present

## 2020-08-21 DIAGNOSIS — U071 COVID-19: Secondary | ICD-10-CM | POA: Diagnosis not present

## 2020-08-21 DIAGNOSIS — J9601 Acute respiratory failure with hypoxia: Secondary | ICD-10-CM | POA: Diagnosis not present

## 2020-08-21 DIAGNOSIS — Z7952 Long term (current) use of systemic steroids: Secondary | ICD-10-CM | POA: Diagnosis not present

## 2020-08-21 DIAGNOSIS — I480 Paroxysmal atrial fibrillation: Secondary | ICD-10-CM | POA: Diagnosis not present

## 2020-08-21 DIAGNOSIS — N179 Acute kidney failure, unspecified: Secondary | ICD-10-CM | POA: Diagnosis not present

## 2020-08-21 DIAGNOSIS — I5032 Chronic diastolic (congestive) heart failure: Secondary | ICD-10-CM | POA: Diagnosis not present

## 2020-08-21 DIAGNOSIS — N1832 Chronic kidney disease, stage 3b: Secondary | ICD-10-CM | POA: Diagnosis not present

## 2020-08-22 DIAGNOSIS — N183 Chronic kidney disease, stage 3 unspecified: Secondary | ICD-10-CM | POA: Diagnosis not present

## 2020-08-28 DIAGNOSIS — N179 Acute kidney failure, unspecified: Secondary | ICD-10-CM | POA: Diagnosis not present

## 2020-08-28 DIAGNOSIS — M1030 Gout due to renal impairment, unspecified site: Secondary | ICD-10-CM | POA: Diagnosis not present

## 2020-08-28 DIAGNOSIS — U071 COVID-19: Secondary | ICD-10-CM | POA: Diagnosis not present

## 2020-08-28 DIAGNOSIS — J9601 Acute respiratory failure with hypoxia: Secondary | ICD-10-CM | POA: Diagnosis not present

## 2020-08-28 DIAGNOSIS — Z7901 Long term (current) use of anticoagulants: Secondary | ICD-10-CM | POA: Diagnosis not present

## 2020-08-28 DIAGNOSIS — Z7952 Long term (current) use of systemic steroids: Secondary | ICD-10-CM | POA: Diagnosis not present

## 2020-08-28 DIAGNOSIS — I130 Hypertensive heart and chronic kidney disease with heart failure and stage 1 through stage 4 chronic kidney disease, or unspecified chronic kidney disease: Secondary | ICD-10-CM | POA: Diagnosis not present

## 2020-08-28 DIAGNOSIS — I5032 Chronic diastolic (congestive) heart failure: Secondary | ICD-10-CM | POA: Diagnosis not present

## 2020-08-28 DIAGNOSIS — I480 Paroxysmal atrial fibrillation: Secondary | ICD-10-CM | POA: Diagnosis not present

## 2020-08-28 DIAGNOSIS — J1282 Pneumonia due to coronavirus disease 2019: Secondary | ICD-10-CM | POA: Diagnosis not present

## 2020-08-28 DIAGNOSIS — N1832 Chronic kidney disease, stage 3b: Secondary | ICD-10-CM | POA: Diagnosis not present

## 2020-10-23 DIAGNOSIS — E7800 Pure hypercholesterolemia, unspecified: Secondary | ICD-10-CM | POA: Diagnosis not present

## 2020-10-23 DIAGNOSIS — I10 Essential (primary) hypertension: Secondary | ICD-10-CM | POA: Diagnosis not present

## 2020-10-23 DIAGNOSIS — M109 Gout, unspecified: Secondary | ICD-10-CM | POA: Diagnosis not present

## 2020-10-23 DIAGNOSIS — E118 Type 2 diabetes mellitus with unspecified complications: Secondary | ICD-10-CM | POA: Diagnosis not present

## 2020-10-29 DIAGNOSIS — E7800 Pure hypercholesterolemia, unspecified: Secondary | ICD-10-CM | POA: Diagnosis not present

## 2020-10-29 DIAGNOSIS — I10 Essential (primary) hypertension: Secondary | ICD-10-CM | POA: Diagnosis not present

## 2020-10-29 DIAGNOSIS — I480 Paroxysmal atrial fibrillation: Secondary | ICD-10-CM | POA: Diagnosis not present

## 2020-10-29 DIAGNOSIS — M109 Gout, unspecified: Secondary | ICD-10-CM | POA: Diagnosis not present

## 2020-10-29 DIAGNOSIS — N183 Chronic kidney disease, stage 3 unspecified: Secondary | ICD-10-CM | POA: Diagnosis not present

## 2020-10-29 DIAGNOSIS — R7309 Other abnormal glucose: Secondary | ICD-10-CM | POA: Diagnosis not present

## 2020-11-12 DIAGNOSIS — I5032 Chronic diastolic (congestive) heart failure: Secondary | ICD-10-CM | POA: Diagnosis not present

## 2020-11-12 DIAGNOSIS — I130 Hypertensive heart and chronic kidney disease with heart failure and stage 1 through stage 4 chronic kidney disease, or unspecified chronic kidney disease: Secondary | ICD-10-CM | POA: Diagnosis not present

## 2020-11-12 DIAGNOSIS — N184 Chronic kidney disease, stage 4 (severe): Secondary | ICD-10-CM | POA: Diagnosis not present

## 2020-11-12 DIAGNOSIS — E1122 Type 2 diabetes mellitus with diabetic chronic kidney disease: Secondary | ICD-10-CM | POA: Diagnosis not present

## 2020-12-12 DIAGNOSIS — E1122 Type 2 diabetes mellitus with diabetic chronic kidney disease: Secondary | ICD-10-CM | POA: Diagnosis not present

## 2020-12-12 DIAGNOSIS — I5032 Chronic diastolic (congestive) heart failure: Secondary | ICD-10-CM | POA: Diagnosis not present

## 2020-12-12 DIAGNOSIS — N184 Chronic kidney disease, stage 4 (severe): Secondary | ICD-10-CM | POA: Diagnosis not present

## 2020-12-12 DIAGNOSIS — I130 Hypertensive heart and chronic kidney disease with heart failure and stage 1 through stage 4 chronic kidney disease, or unspecified chronic kidney disease: Secondary | ICD-10-CM | POA: Diagnosis not present

## 2021-01-12 DIAGNOSIS — N184 Chronic kidney disease, stage 4 (severe): Secondary | ICD-10-CM | POA: Diagnosis not present

## 2021-01-12 DIAGNOSIS — I5032 Chronic diastolic (congestive) heart failure: Secondary | ICD-10-CM | POA: Diagnosis not present

## 2021-01-12 DIAGNOSIS — I130 Hypertensive heart and chronic kidney disease with heart failure and stage 1 through stage 4 chronic kidney disease, or unspecified chronic kidney disease: Secondary | ICD-10-CM | POA: Diagnosis not present

## 2021-01-12 DIAGNOSIS — E1122 Type 2 diabetes mellitus with diabetic chronic kidney disease: Secondary | ICD-10-CM | POA: Diagnosis not present

## 2021-02-24 DIAGNOSIS — I5032 Chronic diastolic (congestive) heart failure: Secondary | ICD-10-CM | POA: Diagnosis not present

## 2021-02-24 DIAGNOSIS — R600 Localized edema: Secondary | ICD-10-CM | POA: Diagnosis not present

## 2021-02-26 DIAGNOSIS — Z23 Encounter for immunization: Secondary | ICD-10-CM | POA: Diagnosis not present

## 2021-02-26 DIAGNOSIS — I5032 Chronic diastolic (congestive) heart failure: Secondary | ICD-10-CM | POA: Diagnosis not present

## 2021-02-26 DIAGNOSIS — E118 Type 2 diabetes mellitus with unspecified complications: Secondary | ICD-10-CM | POA: Diagnosis not present

## 2021-02-26 DIAGNOSIS — R600 Localized edema: Secondary | ICD-10-CM | POA: Diagnosis not present

## 2021-02-26 DIAGNOSIS — I10 Essential (primary) hypertension: Secondary | ICD-10-CM | POA: Diagnosis not present

## 2021-02-26 DIAGNOSIS — E7800 Pure hypercholesterolemia, unspecified: Secondary | ICD-10-CM | POA: Diagnosis not present

## 2021-02-28 DIAGNOSIS — I5032 Chronic diastolic (congestive) heart failure: Secondary | ICD-10-CM | POA: Diagnosis not present

## 2021-02-28 DIAGNOSIS — R600 Localized edema: Secondary | ICD-10-CM | POA: Diagnosis not present

## 2021-02-28 DIAGNOSIS — I10 Essential (primary) hypertension: Secondary | ICD-10-CM | POA: Diagnosis not present

## 2021-03-17 ENCOUNTER — Ambulatory Visit: Payer: Medicare Other | Admitting: Cardiology

## 2021-04-14 DIAGNOSIS — I130 Hypertensive heart and chronic kidney disease with heart failure and stage 1 through stage 4 chronic kidney disease, or unspecified chronic kidney disease: Secondary | ICD-10-CM | POA: Diagnosis not present

## 2021-04-14 DIAGNOSIS — N184 Chronic kidney disease, stage 4 (severe): Secondary | ICD-10-CM | POA: Diagnosis not present

## 2021-04-14 DIAGNOSIS — E1122 Type 2 diabetes mellitus with diabetic chronic kidney disease: Secondary | ICD-10-CM | POA: Diagnosis not present

## 2021-04-14 DIAGNOSIS — I5032 Chronic diastolic (congestive) heart failure: Secondary | ICD-10-CM | POA: Diagnosis not present

## 2021-05-20 DIAGNOSIS — N184 Chronic kidney disease, stage 4 (severe): Secondary | ICD-10-CM | POA: Diagnosis not present

## 2021-05-20 DIAGNOSIS — I10 Essential (primary) hypertension: Secondary | ICD-10-CM | POA: Diagnosis not present

## 2021-05-20 DIAGNOSIS — E118 Type 2 diabetes mellitus with unspecified complications: Secondary | ICD-10-CM | POA: Diagnosis not present

## 2021-05-20 DIAGNOSIS — I480 Paroxysmal atrial fibrillation: Secondary | ICD-10-CM | POA: Diagnosis not present

## 2021-11-14 ENCOUNTER — Emergency Department (HOSPITAL_BASED_OUTPATIENT_CLINIC_OR_DEPARTMENT_OTHER)
Admission: EM | Admit: 2021-11-14 | Discharge: 2021-11-14 | Disposition: A | Discharge status: Home / Self Care | Payer: Medicare Other | Attending: Emergency Medicine | Admitting: Emergency Medicine

## 2021-11-14 ENCOUNTER — Emergency Department (HOSPITAL_BASED_OUTPATIENT_CLINIC_OR_DEPARTMENT_OTHER): Payer: Medicare Other

## 2021-11-14 ENCOUNTER — Other Ambulatory Visit: Payer: Self-pay

## 2021-11-14 ENCOUNTER — Emergency Department (HOSPITAL_BASED_OUTPATIENT_CLINIC_OR_DEPARTMENT_OTHER): Payer: Medicare Other | Admitting: Radiology

## 2021-11-14 ENCOUNTER — Encounter (HOSPITAL_BASED_OUTPATIENT_CLINIC_OR_DEPARTMENT_OTHER): Payer: Self-pay | Admitting: *Deleted

## 2021-11-14 DIAGNOSIS — E119 Type 2 diabetes mellitus without complications: Secondary | ICD-10-CM | POA: Diagnosis not present

## 2021-11-14 DIAGNOSIS — I509 Heart failure, unspecified: Secondary | ICD-10-CM | POA: Diagnosis not present

## 2021-11-14 DIAGNOSIS — I110 Hypertensive heart disease with heart failure: Secondary | ICD-10-CM | POA: Insufficient documentation

## 2021-11-14 DIAGNOSIS — M25551 Pain in right hip: Secondary | ICD-10-CM | POA: Insufficient documentation

## 2021-11-14 DIAGNOSIS — I4891 Unspecified atrial fibrillation: Secondary | ICD-10-CM | POA: Insufficient documentation

## 2021-11-14 DIAGNOSIS — Z79899 Other long term (current) drug therapy: Secondary | ICD-10-CM | POA: Diagnosis not present

## 2021-11-14 LAB — BASIC METABOLIC PANEL WITH GFR
Anion gap: 8 (ref 5–15)
BUN: 21 mg/dL (ref 8–23)
CO2: 28 mmol/L (ref 22–32)
Calcium: 9.5 mg/dL (ref 8.9–10.3)
Chloride: 104 mmol/L (ref 98–111)
Creatinine, Ser: 1.24 mg/dL — ABNORMAL HIGH (ref 0.44–1.00)
GFR, Estimated: 42 mL/min — ABNORMAL LOW
Glucose, Bld: 103 mg/dL — ABNORMAL HIGH (ref 70–99)
Potassium: 4.3 mmol/L (ref 3.5–5.1)
Sodium: 140 mmol/L (ref 135–145)

## 2021-11-14 LAB — CBC
HCT: 40 % (ref 36.0–46.0)
Hemoglobin: 12.7 g/dL (ref 12.0–15.0)
MCH: 31.4 pg (ref 26.0–34.0)
MCHC: 31.8 g/dL (ref 30.0–36.0)
MCV: 98.8 fL (ref 80.0–100.0)
Platelets: 186 10*3/uL (ref 150–400)
RBC: 4.05 MIL/uL (ref 3.87–5.11)
RDW: 14 % (ref 11.5–15.5)
WBC: 6.5 10*3/uL (ref 4.0–10.5)
nRBC: 0 % (ref 0.0–0.2)

## 2021-11-14 LAB — TROPONIN I (HIGH SENSITIVITY)
Troponin I (High Sensitivity): 20 ng/L — ABNORMAL HIGH (ref <18)
Troponin I (High Sensitivity): 20 ng/L — ABNORMAL HIGH

## 2021-11-14 LAB — BRAIN NATRIURETIC PEPTIDE: B Natriuretic Peptide: 305.2 pg/mL — ABNORMAL HIGH (ref 0.0–100.0)

## 2021-11-14 MED ORDER — HYDROCODONE-ACETAMINOPHEN 5-325 MG PO TABS: 1.0000 | ORAL_TABLET | Freq: Four times a day (QID) | ORAL | 0 refills | Status: DC | PRN

## 2021-11-14 MED ORDER — FENTANYL CITRATE PF 50 MCG/ML IJ SOSY
50.0000 ug | PREFILLED_SYRINGE | Freq: Once | INTRAMUSCULAR | Status: AC
  Administered 2021-11-14: 50 ug via INTRAVENOUS
  Filled 2021-11-14: qty 1

## 2021-11-14 NOTE — ED Triage Notes (Signed)
Pt reports right leg pain and bilateral leg swelling.  She was seen at [facility] and sent here for further evaluation.  Pt denies any sob or CP at this time.  Pt reports that she is on xarelto but does not know why

## 2021-11-14 NOTE — ED Triage Notes (Signed)
Pt was brought by ems from [facility] where she was seen for right upper leg pain for 2 days.  MD at [facility] sent her here due to concern for afib with rvr and chf exacerbation.

## 2021-11-14 NOTE — ED Provider Notes (Signed)
[facility] EMERGENCY DEPT Provider Note   CSN: [id number] Arrival date & time: 11/14/21  1554     History {Add pertinent medical, surgical, social history, OB history to HPI:1} Chief Complaint  Patient presents with   Leg Pain    [name] is a 88 y.o. female.  She is brought in by family members for right groin pain that started 2 days ago.  No trauma.  Denies any chest pain or shortness of breath or abdominal pain.  She does notice her legs have been more swollen but denies any numbness or weakness.  Hip pain is worse with ambulation and bending her leg.  No fevers or chills.  Went to urgent care where they found her to be with a rapid heart rate and recommended she come to the emergency department for further evaluation.  Patient does not recall being told that she has atrial fibrillation although is on her problem list and is on blood thinners and diuretics.  The history is provided by the patient and a relative.  Leg Pain Location:  Hip Time since incident:  2 days Injury: no   Hip location:  R hip Pain details:    Quality:  Unable to specify   Severity:  Severe   Onset quality:  Gradual   Duration:  2 days   Timing:  Intermittent   Progression:  Unchanged Chronicity:  New Relieved by:  None tried Worsened by:  Bearing weight Ineffective treatments:  None tried Associated symptoms: swelling   Associated symptoms: no fever, no neck pain, no numbness and no tingling       Home Medications Prior to Admission medications   Medication Sig Start Date End Date Taking? Authorizing Provider  albuterol (VENTOLIN HFA) 108 (90 Base) MCG/ACT inhaler Inhale 2 puffs into the lungs every 6 (six) hours as needed for wheezing or shortness of breath. 07/11/20   Ghimire, Henreitta Leber, MD  colchicine 0.6 MG tablet Take 0.6 mg by mouth daily.    [provider]  Febuxostat 80 MG TABS Take 0.5 tablets by mouth daily. 06/18/20   [provider]   hydrALAZINE (APRESOLINE) 100 MG tablet Take 100 mg by mouth 3 (three) times daily. 05/27/20   [provider]  isosorbide dinitrate (ISORDIL) 30 MG tablet TAKE 1 TABLET(30 MG) BY MOUTH THREE TIMES DAILY 09/19/19   Adrian Prows, MD  pindolol (VISKEN) 5 MG tablet TAKE 1 TABLET(5 MG) BY MOUTH TWICE DAILY 03/25/20   Adrian Prows, MD  Potassium Chloride ER 20 MEQ TBCR Take 1 tablet by mouth daily. 06/04/20   [provider]  predniSONE (DELTASONE) 10 MG tablet Take 40 mg daily for 1 day, 30 mg daily for 1 day, 20 mg daily for 1 days,10 mg daily for 1 day, then stop 07/11/20   Jonetta Osgood, MD  rosuvastatin (CRESTOR) 20 MG tablet Take 20 mg by mouth every evening.    [provider]  torsemide (DEMADEX) 20 MG tablet Take 2 tablets (40 mg total) by mouth 2 (two) times daily at 10 am and 4 pm. Two tablets BID for 3 days only starting 09/19/17 07/14/20   Ghimire, Henreitta Leber, MD  XARELTO 15 MG TABS tablet TAKE 1 TABLET BY MOUTH EVERY EVENING AFTER DINNER 12/01/19   Adrian Prows, MD      Allergies    Amlodipine and Zestoretic [lisinopril-hydrochlorothiazide]    Review of Systems   Review of Systems  Constitutional:  Negative for fever.  HENT:  Negative  for sore throat.   Eyes:  Negative for visual disturbance.  Respiratory:  Negative for shortness of breath.   Cardiovascular:  Positive for leg swelling. Negative for chest pain.  Gastrointestinal:  Negative for abdominal pain.  Genitourinary:  Negative for dysuria.  Musculoskeletal:  Positive for gait problem. Negative for neck pain.  Skin:  Negative for rash.  Neurological:  Negative for weakness and numbness.   Physical Exam Updated Vital Signs BP (!) 179/94 (BP Location: Right Arm)   Pulse 89   Temp 97.8 F (36.6 C)   Resp 16   SpO2 95%  Physical Exam Vitals and nursing note reviewed.  Constitutional:      General: She is not in acute distress.    Appearance: Normal appearance. She is well-developed.  HENT:      Head: Normocephalic and atraumatic.  Eyes:     Conjunctiva/sclera: Conjunctivae normal.  Cardiovascular:     Rate and Rhythm: Normal rate. Rhythm irregular.     Heart sounds: No murmur heard. Pulmonary:     Effort: Pulmonary effort is normal. No respiratory distress.     Breath sounds: Normal breath sounds.  Abdominal:     Palpations: Abdomen is soft.     Tenderness: There is no abdominal tenderness. There is no guarding or rebound.  Musculoskeletal:        General: Tenderness present.     Cervical back: Neck supple.     Right lower leg: Edema present.     Left lower leg: Edema present.  Skin:    General: Skin is warm and dry.     Capillary Refill: Capillary refill takes less than 2 seconds.  Neurological:     Mental Status: She is alert.     Sensory: No sensory deficit.     Motor: No weakness.  Psychiatric:        Mood and Affect: Mood normal.    ED Results / Procedures / Treatments   Labs (all labs ordered are listed, but only abnormal results are displayed) Labs Reviewed  BASIC METABOLIC PANEL  CBC  BRAIN NATRIURETIC PEPTIDE  TROPONIN I (HIGH SENSITIVITY)  TROPONIN I (HIGH SENSITIVITY)    EKG EKG Interpretation  Date/Time:  Friday November 14 2021 16:42:58 EDT Ventricular Rate:  86 PR Interval:    QRS Duration: 78 QT Interval:  370 QTC Calculation: 442 R Axis:   101 Text Interpretation: Atrial fibrillation Anterolateral infarct (cited on or before 07-Jul-2020) Abnormal ECG When compared with ECG of 07-Jul-2020 08:17, Questionable change in initial forces of Anterior leads T wave amplitude has increased in Anterior leads No significant change since prior Confirmed by Aletta Edouard [phone]) on 11/14/2021 5:06:13 PM  Radiology DG Chest 2 View  Result Date: 11/14/2021 CLINICAL DATA:  Leg swelling.  CHF per urgent care. EXAM: CHEST - 2 VIEW COMPARISON:  Radiograph 07/07/2020 FINDINGS: Chronic cardiomegaly. Unchanged mediastinal contours. Dense aortic atherosclerosis.  Moderate bilateral pleural effusions with associated bibasilar opacities. Vascular congestion with suggestion of mild peripheral septal thickening/pulmonary edema. Chronic biapical pleuroparenchymal scarring. Stable osseous structures. IMPRESSION: Findings typical of CHF. Cardiomegaly with moderate pleural effusions, vascular congestion and mild pulmonary edema. Electronically Signed   By: Keith Rake M.D.   On: 11/14/2021 17:00    Procedures Procedures  {Document cardiac monitor, telemetry assessment procedure when appropriate:1}  Medications Ordered in ED Medications - No data to display  ED Course/ Medical Decision Making/ A&P Clinical Course as of 11/14/21 2132  Fri Nov 14, 2021  1839 Family states  she last saw her primary care doctor in December.  They cannot tell me what medication she is taking.  They said she uses a cane to walk around and she has been able to get to the bathroom back but is having more trouble since this pain in her groin. [MB]    Clinical Course User Index [MB] Hayden Rasmussen, MD                           Medical Decision Making Amount and/or Complexity of Data Reviewed Labs: ordered. Radiology: ordered.  Risk Prescription drug management.  This patient complains of ***; this involves an extensive number of treatment Options and is a complaint that carries with it a high risk of complications and morbidity. The differential includes ***  I ordered, reviewed and interpreted labs, which included *** I ordered medication *** and reviewed PMP when indicated. I ordered imaging studies which included *** and I independently    visualized and interpreted imaging which showed *** Additional history obtained from *** Previous records obtained and reviewed *** I consulted *** and discussed lab and imaging findings and discussed disposition.  Cardiac monitoring reviewed, *** Social determinants considered, *** Critical Interventions: ***  After the  interventions stated above, I reevaluated the patient and found *** Admission and further testing considered, ***  CHA2DS2/VAS Stroke Risk Points  Current as of 12 minutes ago     6 >= 2 Points: High Risk  1 - 1.99 Points: Medium Risk  0 Points: Low Risk    Last Change: N/A      Details    This score determines the patient's risk of having a stroke if the  patient has atrial fibrillation.       Points Metrics  1 Has Congestive Heart Failure:  Yes    Current as of 12 minutes ago  0 Has Vascular Disease:  No    Current as of 12 minutes ago  1 Has Hypertension:  Yes    Current as of 12 minutes ago  2 Age:  60    Current as of 12 minutes ago  1 Has Diabetes:  Yes     Current as of 12 minutes ago  0 Had Stroke:  No  Had TIA:  No  Had Thromboembolism:  No    Current as of 12 minutes ago  1 Female:  Yes    Current as of 12 minutes ago            {Document critical care time when appropriate:1} {Document review of labs and clinical decision tools ie heart score, Chads2Vasc2 etc:1}  {Document your independent review of radiology images, and any outside records:1} {Document your discussion with family members, caretakers, and with consultants:1} {Document social determinants of health affecting pt's care:1} {Document your decision making why or why not admission, treatments were needed:1} Final Clinical Impression(s) / ED Diagnoses Final diagnoses:  None    Rx / DC Orders ED Discharge Orders     None

## 2021-11-14 NOTE — ED Notes (Signed)
Discharge paperwork given and understood. 

## 2021-11-14 NOTE — Discharge Instructions (Signed)
You were seen in the emergency department for right groin pain.  You had x-rays and a CAT scan of your right hip that did not show any fracture.  You had an ultrasound that did not show a blood clot.  We are prescribing you some narcotic pain medicine that will help with pain but may make you dizzy or constipated so please use care.  Please follow-up with your primary care doctor.  Return to the emergency department if any worsening or concerning symptoms

## 2021-11-14 NOTE — ED Notes (Signed)
RT note: Unable to obtain labs at this time, XRAY awaiting pt. for orders, RN made aware.

## 2021-11-14 NOTE — ED Notes (Signed)
Family assisting Pt out of her pants, Pt refused Staff to help.

## 2022-03-05 ENCOUNTER — Ambulatory Visit: Payer: PRIVATE HEALTH INSURANCE | Admitting: Cardiology

## 2022-03-05 ENCOUNTER — Encounter: Payer: Self-pay | Admitting: Cardiology

## 2022-03-05 VITALS — BP 119/68 | HR 76 | Temp 97.8°F | Resp 16 | Ht 67.0 in | Wt 215.8 lb

## 2022-03-05 DIAGNOSIS — I5033 Acute on chronic diastolic (congestive) heart failure: Secondary | ICD-10-CM

## 2022-03-05 DIAGNOSIS — I10 Essential (primary) hypertension: Secondary | ICD-10-CM

## 2022-03-05 DIAGNOSIS — I4821 Permanent atrial fibrillation: Secondary | ICD-10-CM

## 2022-03-05 DIAGNOSIS — N1832 Chronic kidney disease, stage 3b: Secondary | ICD-10-CM

## 2022-03-05 MED ORDER — SPIRONOLACTONE 25 MG PO TABS: 12.5000 mg | ORAL_TABLET | ORAL | 2 refills | Status: DC

## 2022-03-05 NOTE — Progress Notes (Signed)
Subjective:  Primary Physician:  [name], MD  Patient ID: Shannon [name], female    DOB: [birth year], 88 y.o.   MRN: [id number]  Chief Complaint  Patient presents with   Worsening CHF   Follow-up   HPI: Shannon [name]  is a 88 y.o. female  with  paroxysmal A. Fib S/P cardioversion 10/2016, difficult to control hypertension (No RAS by angio in 2016), stage IIIa-b chronic kidney disease, hyperlipidemia, obesity, frequent PVCs and wandering atrial pacemaker by electrocardiogram and history of gout.  Nuclear stress in Apr 2018 was non ischemic.  Patient had not been seen by me for most 3 years now, was referred back to me for evaluation of heart failure.  Over the past few weeks patient has noticed gradually worsening dyspnea and also leg edema and 2 to 3 weeks ago developed right lower extremity ulceration on the lateral aspect of her leg.  She was started on Jardiance and referred to me.  Since being on Jardiance, patient has noticed increased urine output.  Leg edema is still persistent.  She denies chest pain, palpitations.  No PND but does sleep reclined.   Past Medical History:  Diagnosis Date   Acute exacerbation of CHF (congestive heart failure) ([place]) 09/24/2017   Arthritis    "all over"   Atrial fibrillation (HCC)    Chronic diastolic CHF (congestive heart failure) ([place]) 09/24/2017   Chronic kidney disease    Dyspnea    Gout    Hypercholesteremia    Hypertension    Mitral regurgitation     Past Surgical History:  Procedure Laterality Date   BREAST BIOPSY Left 1984   CARDIOVERSION N/A 10/27/2016   Procedure: CARDIOVERSION;  Surgeon: Adrian Prows, MD;  Location: [facility];  Service: Cardiovascular;  Laterality: N/A;   CATARACT EXTRACTION, BILATERAL Bilateral 2000's   RENAL ANGIOGRAM Bilateral 07/31/2014   Procedure: RENAL ANGIOGRAM;  Surgeon: Laverda Page, MD;  Location: [facility] CATH LAB;  Service: Cardiovascular;  Laterality: Bilateral;   Social History    Tobacco Use   Smoking status: Never   Smokeless tobacco: Former    Types: Snuff   Tobacco comments:    "quit using snuff in the 1960's"  Substance Use Topics   Alcohol use: No  Marital Status: Married    Orders Placed This Encounter  Procedures   Basic metabolic panel   Pro b natriuretic peptide (BNP)   EKG 12-Lead   PCV ECHOCARDIOGRAM COMPLETE    Standing Status:   Future    Standing Expiration Date:   03/06/2023   Review of Systems  Cardiovascular:  Positive for dyspnea on exertion and leg swelling. Negative for chest pain.      Objective:      03/05/2022   10:55 AM 11/14/2021    9:00 PM 11/14/2021    8:52 PM  Vitals with BMI  Height 5' 7"     Weight 215 lbs 13 oz    BMI 68.61    Systolic 683 729 021  Diastolic 68 96 97  Pulse 76 91 90    Physical Exam Neck:     Vascular: No carotid bruit or JVD.  Cardiovascular:     Rate and Rhythm: Normal rate. Rhythm irregular.     Pulses: Normal pulses and intact distal pulses.     Heart sounds: No murmur heard. Pulmonary:     Effort: Pulmonary effort is normal.     Breath sounds: Normal breath sounds.  Abdominal:     General:  Bowel sounds are normal.     Palpations: Abdomen is soft.  Musculoskeletal:     Right lower leg: Edema (2-3+ pitting, right lateral leg superficial ulceration, dry) present.     Left lower leg: Edema (2-3+ pitting below knee) present.  Skin:    Capillary Refill: Capillary refill takes less than 2 seconds.     Radiology: No results found.  Laboratory Examination:  Lab Results  Component Value Date   NA 140 11/14/2021   K 4.3 11/14/2021   CO2 28 11/14/2021   GLUCOSE 103 (H) 11/14/2021   BUN 21 11/14/2021   CREATININE 1.24 (H) 11/14/2021   CALCIUM 9.5 11/14/2021   GFRNONAA 42 (L) 11/14/2021       Latest Ref Rng & Units 11/14/2021    7:00 PM 07/11/2020   12:53 AM 07/10/2020   12:27 AM  CMP  Glucose 70 - 99 mg/dL 103  155  164   BUN 8 - 23 mg/dL 21  52  49   Creatinine 0.44 - 1.00  mg/dL 1.24  1.85  2.01   Sodium 135 - 145 mmol/L 140  138  138   Potassium 3.5 - 5.1 mmol/L 4.3  4.6  4.3   Chloride 98 - 111 mmol/L 104  108  105   CO2 22 - 32 mmol/L 28  21  20    Calcium 8.9 - 10.3 mg/dL 9.5  8.6  8.7   Total Protein 6.5 - 8.1 g/dL  6.3  6.5   Total Bilirubin 0.3 - 1.2 mg/dL  0.7  0.6   Alkaline Phos 38 - 126 U/L  58  54   AST 15 - 41 U/L  27  20   ALT 0 - 44 U/L  19  15       Latest Ref Rng & Units 11/14/2021    7:00 PM 07/11/2020   12:53 AM 07/10/2020   12:27 AM  CBC  WBC 4.0 - 10.5 K/uL 6.5  9.1  9.5   Hemoglobin 12.0 - 15.0 g/dL 12.7  12.3  12.1   Hematocrit 36.0 - 46.0 % 40.0  37.9  38.5   Platelets 150 - 400 K/uL 186  281  338    Lipid Panel     Component Value Date/Time   TRIG 91 07/07/2020 1058   HEMOGLOBIN A1C Lab Results  Component Value Date   HGBA1C 5.5 09/24/2017   MPG 111.15 09/24/2017   TSH No results for input(s): "TSH" in the last 8760 hours.  BNP (last 3 results) Recent Labs    11/14/21 1900  BNP 305.2*   ProBNP (last 3 results) No results for input(s): "PROBNP" in the last 8760 hours.  External Labs:  Labs 02/20/2022:  proBNP 943.  Sodium 141, potassium 3.9, BUN 25, creatinine 1.47, EGFR 36.  mL.  A1c 5.2%.  Total cholesterol 159, triglycerides 69, HDL 50, LDL 95.  Allergies  Allergen Reactions   Amlodipine Swelling    Intolerance, LE edema    Zestoretic [Lisinopril-Hydrochlorothiazide] Itching and Rash     Current Outpatient Medications:    empagliflozin (JARDIANCE) 10 MG TABS tablet, Take 10 mg by mouth daily., Disp: , Rfl:    hydrALAZINE (APRESOLINE) 100 MG tablet, Take 100 mg by mouth 3 (three) times daily., Disp: , Rfl:    isosorbide dinitrate (ISORDIL) 30 MG tablet, TAKE 1 TABLET(30 MG) BY MOUTH THREE TIMES DAILY, Disp: 90 tablet, Rfl: 2   pindolol (VISKEN) 5 MG tablet, TAKE 1 TABLET(5 MG) BY MOUTH TWICE  DAILY, Disp: 180 tablet, Rfl: 1   Potassium Chloride ER 20 MEQ TBCR, Take 1 tablet by mouth daily., Disp: ,  Rfl:    spironolactone (ALDACTONE) 25 MG tablet, Take 0.5 tablets (12.5 mg total) by mouth every morning., Disp: 30 tablet, Rfl: 2   torsemide (DEMADEX) 20 MG tablet, Take 2 tablets (40 mg total) by mouth 2 (two) times daily at 10 am and 4 pm. Two tablets BID for 3 days only starting 09/19/17, Disp: 180 tablet, Rfl: 3   XARELTO 15 MG TABS tablet, TAKE 1 TABLET BY MOUTH EVERY EVENING AFTER DINNER, Disp: 90 tablet, Rfl: 0   CARDIAC STUDIES:   Renal Angio  [07/31/2014]: Left mild disease. Right mild to moderate disease. Unable to perform pressure pull back to evaluate significance. Does not appear high grade.   Lexiscan myoview stress test 10/12/2016: 1. The resting electrocardiogram demonstrated atrial fibrillation, normal resting conduction and nonspecific T changes. Stress EKG is non-diagnostic for ischemia as it a pharmacologic stress using Lexiscan. Rare PVC. Stress symptoms included dyspnea. 2. Left ventricular cavity is noted to be enlarged on the rest and stress studies with LV end diastolic volume of 007MA. SPECT images demonstrate homogeneous tracer distribution throughout the myocardium. No ischemia or scar. The left ventricular ejection fraction was calculated to be 41%. The ejection fraction may not be accurate and may be related to difficulty in getting EKG due to atrial fibrillation. This is a low risk study.  Echocardiogram   Hospital 09/25/2017: Low normal LVEF at 26-33%, grade 2 diastolic dysfunction. Severely dilated left atrium and right atrium, mild RV dilatation with mild RVH. Moderate pulmonary hypertension, PA pressure 49 mmHg.  LE segmental pressure 09/27/2017: Right: Resting right ankle-brachial index is within normal range. No evidence of significant right lower extremity arterial disease. The right toe-brachial index is normal. RT great toe pressure = 106 mmHg. Left: Resting left ankle-brachial index is within normal range. No evidence of significant left lower extremity  arterial disease. The left toe-brachial index is normal. LT Great toe pressure = 107 mmHg. Left second toe pressure = 71 mmHg.  EKG:  EKG 03/05/2022: Atrial fibrillation with controlled ventricular response at rate of 76 bpm, normal axis, anteroseptal infarct old.  No evidence of ischemia, normal QT interval.  Low-voltage complexes.  Consider pulmonary disease pattern.  Compared to compared to 03/29/2019, no significant change.   Assessment:       ICD-10-CM   1. Acute on chronic diastolic heart failure (HCC)  I50.33 PCV ECHOCARDIOGRAM COMPLETE    spironolactone (ALDACTONE) 25 MG tablet    Basic metabolic panel    Pro b natriuretic peptide (BNP)    2. Permanent atrial fibrillation (HCC)  I48.21 EKG 12-Lead    PCV ECHOCARDIOGRAM COMPLETE    3. Primary hypertension  I10     4. Stage 3b chronic kidney disease (HCC)  N18.32       CHA2DS2-VASc Score is 5.  Yearly risk of stroke: 7% (F, Age, HTN, CHF).  Score of 1=0.6; 2=2.2; 3=3.2; 4=4.8; 5=7.2; 6=9.8; 7=>9.8) -(CHF; HTN; vasc disease DM,  Female = 1; Age <65 =0; 65-74 = 1,  >75 =2; stroke/embolism= 2).     Recommendations:   Mellina Benison s a 88 y.o. African-American female patient with  paroxysmal A. Fib S/P cardioversion 10/2016, difficult to control hypertension (No RAS by angio in 2016), stage IIIb chronic kidney disease, hyperlipidemia, obesity, frequent PVCs and wandering atrial pacemaker by electrocardiogram and history of gout.  Nuclear stress in  Apr 2018 was non ischemic.  Patient had not been seen by me for most 3 years now, was referred back to me for evaluation of heart failure.  The ulceration appears very superficial.  I have advised her complete bedrest for now, to keep her foot elevated except when she is ambulatory.  I have added 12.5 mg of Aldactone, continue Jardiance, I have given her samples also.  She needs repeat echocardiogram.  I have discussed with her regarding salt restriction and also fluid restriction  for now.  Continue present dose diuretics without increasing them as she is now on Jardiance.  Blood pressure is well controlled, she does have stage IIIb chronic kidney disease, presently on Xarelto for anticoagulation, continue the same and rate is well controlled on low-dose beta-blocker therapy.  She will need repeat BMP and BNP/proBNP in 2 weeks prior to her next office visit may in 2 weeks.  I will enroll in RPM for heart failure management as she is extremely high risk for hospitalization.  I have reviewed her labs from Dr. Ashby Dawes dated 02/20/2022.  We will continue to monitor her very closely.    Adrian Prows, MD, [facility] 03/05/2022, 12:16 PM Office: [phone] Fax: [phone] Pager: [phone]

## 2022-03-05 NOTE — Patient Instructions (Signed)
Please go to [facility] close to home, obtain labs in 10 days, at least 2 days prior to her next office visit with me.

## 2022-03-11 ENCOUNTER — Encounter (HOSPITAL_BASED_OUTPATIENT_CLINIC_OR_DEPARTMENT_OTHER): Payer: Medicare Other | Attending: Internal Medicine | Admitting: Physician Assistant

## 2022-03-11 DIAGNOSIS — M1990 Unspecified osteoarthritis, unspecified site: Secondary | ICD-10-CM | POA: Diagnosis not present

## 2022-03-11 DIAGNOSIS — Z7901 Long term (current) use of anticoagulants: Secondary | ICD-10-CM | POA: Diagnosis not present

## 2022-03-11 DIAGNOSIS — J449 Chronic obstructive pulmonary disease, unspecified: Secondary | ICD-10-CM | POA: Insufficient documentation

## 2022-03-11 DIAGNOSIS — I890 Lymphedema, not elsewhere classified: Secondary | ICD-10-CM | POA: Insufficient documentation

## 2022-03-11 DIAGNOSIS — W2209XA Striking against other stationary object, initial encounter: Secondary | ICD-10-CM | POA: Diagnosis not present

## 2022-03-11 DIAGNOSIS — N183 Chronic kidney disease, stage 3 unspecified: Secondary | ICD-10-CM | POA: Insufficient documentation

## 2022-03-11 DIAGNOSIS — I130 Hypertensive heart and chronic kidney disease with heart failure and stage 1 through stage 4 chronic kidney disease, or unspecified chronic kidney disease: Secondary | ICD-10-CM | POA: Insufficient documentation

## 2022-03-11 DIAGNOSIS — I480 Paroxysmal atrial fibrillation: Secondary | ICD-10-CM | POA: Insufficient documentation

## 2022-03-11 DIAGNOSIS — G4730 Sleep apnea, unspecified: Secondary | ICD-10-CM | POA: Diagnosis not present

## 2022-03-11 DIAGNOSIS — I509 Heart failure, unspecified: Secondary | ICD-10-CM | POA: Insufficient documentation

## 2022-03-11 DIAGNOSIS — H409 Unspecified glaucoma: Secondary | ICD-10-CM | POA: Diagnosis not present

## 2022-03-11 NOTE — Progress Notes (Signed)
**Note [name]-Identified via Obfuscation** Shannon [name], Shannon [name] ([id number]) Visit Report for 03/11/2022 Abuse Risk Screen Details Patient Name: Date of Service: [name], [name] 03/11/2022 9:45 A M Medical Record Number: [id number] Patient Account Number: [id number] Date of Birth/Sex: Treating RN: 01/09/1931 (88 y.o. F) Primary Care Shannon [name]: Shannon [name] Other Clinician: Referring Harmoney Sienkiewicz: Treating Ashyia Schraeder/Extender: Theadora Rama in Treatment: 0 Abuse Risk Screen Items Answer ABUSE RISK SCREEN: Has anyone close to you tried to hurt or harm you recentlyo No Do you feel uncomfortable with anyone in your familyo No Has anyone forced you do things that you didnt want to doo No Electronic Signature(s) Signed: 03/11/2022 4:38:36 PM By: Erenest Blank Entered By: Erenest Blank on 03/11/2022 10:14:07 -------------------------------------------------------------------------------- Activities of Daily Living Details Patient Name: Date of Service: Shannon [name], [name] 03/11/2022 9:45 A M Medical Record Number: [id number] Patient Account Number: [id number] Date of Birth/Sex: Treating RN: [birth year] (88 y.o. F) Primary Care [place] Shannahan: Shannon [name] Other Clinician: Referring Chloeann Alfred: Treating Hipolito Martinezlopez/Extender: Theadora Rama in Treatment: 0 Activities of Daily Living Items Answer Activities of Daily Living (Please select one for each item) Drive Automobile Not Able T Medications ake Completely Able Use T elephone Completely Able Care for Appearance Completely Able Use T oilet Completely Able Bath / Shower Completely Able Dress Self Completely Able Feed Self Completely Able Walk Need Assistance Get In / Out Bed Completely Able Housework Completely Able Prepare Meals Completely [place] for Self Completely Able Electronic Signature(s) Signed: 03/11/2022 4:38:36 PM By: Erenest Blank Entered By: Erenest Blank on 03/11/2022  10:14:39 -------------------------------------------------------------------------------- Education Screening Details Patient Name: Date of Service: Shannon [name], [name] 03/11/2022 9:45 A M Medical Record Number: [id number] Patient Account Number: [id number] Date of Birth/Sex: Treating RN: September 17, 1930 (88 y.o. F) Primary Care [name]: Shannon [name] Other Clinician: Referring [name]: Treating Aayush Gelpi/Extender: Theadora Rama in Treatment: 0 Primary Learner Assessed: Patient Learning Preferences/Education Level/Primary Language Learning Preference: Explanation, Demonstration, Printed Material Highest Education Level: Grade School Preferred Language: English Cognitive Barrier Language Barrier: No Translator Needed: No Memory Deficit: No Emotional Barrier: No Cultural/Religious Beliefs Affecting Medical Care: No Physical Barrier Impaired Vision: Yes Glasses, rarely wears them Impaired Hearing: No Decreased Hand dexterity: No Knowledge/Comprehension Knowledge Level: High Comprehension Level: High Ability to understand written instructions: High Ability to understand verbal instructions: High Motivation Anxiety Level: Calm Cooperation: Cooperative Education Importance: Acknowledges Need Interest in Health Problems: Asks Questions Perception: Coherent Willingness to Engage in Self-Management High Activities: Readiness to Engage in Self-Management High Activities: Electronic Signature(s) Signed: 03/11/2022 4:38:36 PM By: Erenest Blank Entered By: Erenest Blank on 03/11/2022 10:15:31 -------------------------------------------------------------------------------- Fall Risk Assessment Details Patient Name: Date of Service: Shannon [place], Shannon TSY H. 03/11/2022 9:45 A M Medical Record Number: [id number] Patient Account Number: [id number] Date of Birth/Sex: Treating RN: 03/27/1931 (88 y.o. F) Primary Care [name]: Shannon [name] Other Clinician: Referring  [name]: Treating [name]/Extender: Theadora Rama in Treatment: 0 Fall Risk Assessment Items Have you had 2 or more falls in the last 12 monthso 0 No Have you had any fall that resulted in injury in the last 12 monthso 0 No FALLS RISK SCREEN History of falling - immediate or within 3 months 0 No Secondary diagnosis (Do you have 2 or more medical diagnoseso) 0 No Ambulatory aid None/bed rest/wheelchair/nurse 0 No Crutches/cane/walker 15 Yes Furniture 0 No Intravenous therapy Access/Saline/Heparin Lock 0 No Gait/Transferring Normal/ bed rest/ wheelchair 0 Yes Weak (short  steps with or without shuffle, stooped but able to lift head while walking, may seek 0 No support from furniture) Impaired (short steps with shuffle, may have difficulty arising from chair, head down, impaired 0 No balance) Mental Status Oriented to own ability 0 Yes Electronic Signature(s) Signed: 03/11/2022 4:38:36 PM By: Erenest Blank Entered By: Erenest Blank on 03/11/2022 10:16:01 -------------------------------------------------------------------------------- Nutrition Risk Screening Details Patient Name: Date of Service: Shannon [name], [name] 03/11/2022 9:45 A M Medical Record Number: [id number] Patient Account Number: [id number] Date of Birth/Sex: Treating RN: 1930-10-29 (88 y.o. F) Primary Care Abdiel Blackerby: Shannon [name] Other Clinician: Referring Oralee Rapaport: Treating Aylene Acoff/Extender: Theadora Rama in Treatment: 0 Height (in): 67 Weight (lbs): 208 Body Mass Index (BMI): 32.6 Nutrition Risk Screening Items Score Screening NUTRITION RISK SCREEN: I have an illness or condition that made me change the kind and/or amount of food I eat 0 No I eat fewer than two meals per day 0 No I eat few fruits and vegetables, or milk products 0 No I have three or more drinks of beer, liquor or wine almost every day 0 No I have tooth or mouth problems that make it hard for me to  eat 0 No I don't always have enough money to buy the food I need 0 No I eat alone most of the time 0 No I take three or more different prescribed or over-the-counter drugs a day 1 Yes Without wanting to, I have lost or gained 10 pounds in the last six months 0 No I am not always physically able to shop, cook and/or feed myself 0 No Nutrition Protocols Good Risk Protocol 0 No interventions needed Moderate Risk Protocol High Risk Proctocol Risk Level: Good Risk Score: 1 Electronic Signature(s) Signed: 03/11/2022 4:38:36 PM By: Erenest Blank Entered By: Erenest Blank on 03/11/2022 10:16:37

## 2022-03-11 NOTE — Progress Notes (Signed)
Shannon [name], Shannon [name] ([id number]) Visit Report for 03/11/2022 Allergy List Details Patient Name: Date of Service: [place], [name] 03/11/2022 9:45 A M Medical Record Number: [id number] Patient Account Number: [id number] Date of Birth/Sex: Treating RN: May 17, 1931 (88 y.o. Shannon [name] Primary Care Narcisa Ganesh: [name] Other Clinician: Referring Doral Ventrella: Treating Jayston Trevino/Extender: Gypsy Decant in Treatment: 0 Allergies Active Allergies Zestoretic spironolactone amlodipine Allergy Notes Electronic Signature(s) Signed: 03/11/2022 4:38:36 PM By: Erenest Blank Entered By: Erenest Blank on 03/11/2022 10:05:43 -------------------------------------------------------------------------------- Arrival Information Details Patient Name: Date of Service: [place], [place] Shannon H. 03/11/2022 9:45 A M Medical Record Number: [id number] Patient Account Number: [id number] Date of Birth/Sex: Treating RN: [birth year] (88 y.o. F) Primary Care Electa Sterry: [name] Other Clinician: Referring Naarah Borgerding: Treating Leisl Spurrier/Extender: Theadora Rama in Treatment: 0 Visit Information Patient Arrived: Shannon [name] Time: 10:01 Accompanied By: daughter Transfer Assistance: None Patient Identification Verified: Yes Secondary Verification Process Completed: Yes Patient Requires Transmission-Based Precautions: No Patient Has Alerts: Yes Patient Alerts: Patient on Blood Thinner Electronic Signature(s) Signed: 03/11/2022 4:38:36 PM By: Erenest Blank Entered By: Erenest Blank on 03/11/2022 10:02:15 -------------------------------------------------------------------------------- Clinic Level of Care Assessment Details Patient Name: Date of Service: [place], [place]. 03/11/2022 9:45 A M Medical Record Number: [id number] Patient Account Number: [id number] Date of Birth/Sex: Treating RN: 1930-11-09 (88 y.o. Shannon [name] Primary Care Hiromi Knodel: [name] Other  Clinician: Referring Aoife Bold: Treating Kateryna Grantham/Extender: Theadora Rama in Treatment: 0 Clinic Level of Care Assessment Items TOOL 2 Quantity Score X- 1 0 Use when only an EandM is performed on the INITIAL visit ASSESSMENTS - Nursing Assessment / Reassessment X- 1 20 General Physical Exam (combine w/ comprehensive assessment (listed just below) when performed on new pt. evals) X- 1 25 Comprehensive Assessment (HX, ROS, Risk Assessments, Wounds Hx, etc.) ASSESSMENTS - Wound and Skin A ssessment / Reassessment X - Simple Wound Assessment / Reassessment - one wound 1 5 '[]'$  - 0 Complex Wound Assessment / Reassessment - multiple wounds X- 1 10 Dermatologic / Skin Assessment (not related to wound area) ASSESSMENTS - Ostomy and/or Continence Assessment and Care '[]'$  - 0 Incontinence Assessment and Management '[]'$  - 0 Ostomy Care Assessment and Management (repouching, etc.) PROCESS - Coordination of Care X - Simple Patient / Family Education for ongoing care 1 15 '[]'$  - 0 Complex (extensive) Patient / Family Education for ongoing care X- 1 10 Staff obtains Programmer, systems, Records, T Results / Process Orders est '[]'$  - 0 Staff telephones HHA, Nursing Homes / Clarify orders / etc '[]'$  - 0 Routine Transfer to another Facility (non-emergent condition) '[]'$  - 0 Routine Hospital Admission (non-emergent condition) '[]'$  - 0 New Admissions / Biomedical engineer / Ordering NPWT Apligraf, etc. , '[]'$  - 0 Emergency Hospital Admission (emergent condition) X- 1 10 Simple Discharge Coordination '[]'$  - 0 Complex (extensive) Discharge Coordination PROCESS - Special Needs '[]'$  - 0 Pediatric / Minor Patient Management '[]'$  - 0 Isolation Patient Management '[]'$  - 0 Hearing / Language / Visual special needs '[]'$  - 0 Assessment of Community assistance (transportation, D/C planning, etc.) '[]'$  - 0 Additional assistance / Altered mentation '[]'$  - 0 Support Surface(s) Assessment (bed, cushion, seat,  etc.) INTERVENTIONS - Wound Cleansing / Measurement '[]'$  - 0 Wound Imaging (photographs - any number of wounds) '[]'$  - 0 Wound Tracing (instead of photographs) '[]'$  - 0 Simple Wound Measurement - one wound '[]'$  - 0 Complex Wound Measurement - multiple wounds '[]'$  - 0 Simple  Wound Cleansing - one wound '[]'$  - 0 Complex Wound Cleansing - multiple wounds INTERVENTIONS - Wound Dressings '[]'$  - 0 Small Wound Dressing one or multiple wounds '[]'$  - 0 Medium Wound Dressing one or multiple wounds '[]'$  - 0 Large Wound Dressing one or multiple wounds '[]'$  - 0 Application of Medications - injection INTERVENTIONS - Miscellaneous '[]'$  - 0 External ear exam '[]'$  - 0 Specimen Collection (cultures, biopsies, blood, body fluids, etc.) '[]'$  - 0 Specimen(s) / Culture(s) sent or taken to Lab for analysis '[]'$  - 0 Patient Transfer (multiple staff / Harrel Lemon Lift / Similar devices) '[]'$  - 0 Simple Staple / Suture removal (25 or less) '[]'$  - 0 Complex Staple / Suture removal (26 or more) '[]'$  - 0 Hypo / Hyperglycemic Management (close monitor of Blood Glucose) '[]'$  - 0 Ankle / Brachial Index (ABI) - do not check if billed separately Has the patient been seen at the hospital within the last three years: Yes Total Score: 95 Level Of Care: New/Established - Level 3 Electronic Signature(s) Signed: 03/11/2022 1:33:45 PM By: Deon Pilling RN, BSN Entered By: Deon Pilling on 03/11/2022 10:38:45 -------------------------------------------------------------------------------- Encounter Discharge Information Details Patient Name: Date of Service: [place], [place] Shannon H. 03/11/2022 9:45 A M Medical Record Number: [id number] Patient Account Number: [id number] Date of Birth/Sex: Treating RN: 1930-06-24 (88 y.o. Shannon [name] Primary Care Courtni Balash: [name] Other Clinician: Referring Tomie Spizzirri: Treating Terence Bart/Extender: Theadora Rama in Treatment: 0 Encounter Discharge Information Items Discharge Condition:  Stable Ambulatory Status: Cane Discharge Destination: Home Transportation: Private Auto Accompanied By: Daughter Schedule Follow-up Appointment: Yes Clinical Summary of Care: Electronic Signature(s) Signed: 03/11/2022 1:33:45 PM By: Deon Pilling RN, BSN Entered By: Deon Pilling on 03/11/2022 10:46:16 -------------------------------------------------------------------------------- Lower Extremity Assessment Details Patient Name: Date of Service: [place], [place]. 03/11/2022 9:45 A M Medical Record Number: [id number] Patient Account Number: [id number] Date of Birth/Sex: Treating RN: Apr 04, 1931 (88 y.o. Shannon [name] Primary Care Natsumi Whitsitt: [name] Other Clinician: Referring Monet North: Treating Izabell Schalk/Extender: Theadora Rama in Treatment: 0 Edema Assessment Assessed: Shannon [name]: No] Shannon [name]: Yes] Edema: [Left: Ye] [Right: s] Calf Left: Right: Point of Measurement: 35 cm From Medial Instep 44 cm Ankle Left: Right: Point of Measurement: 9 cm From Medial Instep 27.1 cm Knee To Floor Left: Right: From Medial Instep 47 cm Vascular Assessment Pulses: Dorsalis Pedis Palpable: [Right:Yes] Doppler Audible: [Right:Yes] Posterior Tibial Palpable: [Right:Yes Yes] Electronic Signature(s) Signed: 03/11/2022 1:33:45 PM By: Deon Pilling RN, BSN Entered By: Deon Pilling on 03/11/2022 10:24:15 -------------------------------------------------------------------------------- Multi-Disciplinary Care Plan Details Patient Name: Date of Service: [place], [place] Shannon H. 03/11/2022 9:45 A M Medical Record Number: [id number] Patient Account Number: [id number] Date of Birth/Sex: Treating RN: 12-May-1931 (88 y.o. Shannon [name] Primary Care Kamariyah Timberlake: [name] Other Clinician: Referring Alecia Doi: Treating Rechel Delosreyes/Extender: Theadora Rama in Treatment: 0 Active Inactive Electronic Signature(s) Signed: 03/11/2022 1:33:45 PM By: Deon Pilling RN,  BSN Entered By: Deon Pilling on 03/11/2022 10:36:08 -------------------------------------------------------------------------------- Patient/Caregiver Education Details Patient Name: Date of Service: [name], [name] 9/27/2023andnbsp9:45 A M Medical Record Number: [id number] Patient Account Number: [id number] Date of Birth/Gender: Treating RN: 05/02/1931 (88 y.o. Shannon [name] Primary Care Physician: [name] Other Clinician: Referring Physician: Treating Physician/Extender: Theadora Rama in Treatment: 0 Education Assessment Education Provided To: Patient Education Topics Provided Wound/Skin Impairment: Handouts: Skin Care Do's and Dont's Methods: Explain/Verbal Responses: Reinforcements needed Electronic Signature(s) Signed: 03/11/2022 1:33:45 PM By:  Deon Pilling RN, BSN Entered By: Deon Pilling on 03/11/2022 10:36:27 -------------------------------------------------------------------------------- Vitals Details Patient Name: Date of Service: [place], [place] Shannon H. 03/11/2022 9:45 A M Medical Record Number: [id number] Patient Account Number: [id number] Date of Birth/Sex: Treating RN: Feb 19, 1931 (88 y.o. F) Primary Care Akane Tessier: [name] Other Clinician: Referring Sherrina Zaugg: Treating Sheridan Hew/Extender: Theadora Rama in Treatment: 0 Vital Signs Time Taken: 10:02 Temperature (F): 98.2 Height (in): 67 Pulse (bpm): 80 Source: Stated Respiratory Rate (breaths/min): 18 Weight (lbs): 208 Blood Pressure (mmHg): 158/82 Source: Stated Reference Range: 80 - 120 mg / dl Body Mass Index (BMI): 32.6 Electronic Signature(s) Signed: 03/11/2022 4:38:36 PM By: Erenest Blank Entered By: Erenest Blank on 03/11/2022 10:04:12

## 2022-03-13 ENCOUNTER — Telehealth: Payer: Self-pay

## 2022-03-13 NOTE — Telephone Encounter (Signed)
Spoke with patient's daughter for RPM 1 week follow up. Discussed compliance and importance of daily weights. Daughter to speak with patient. Will continue to monitor at this time.   Weight Readings: 03/13/2022 Friday at 08:35 AM 208.6      03/09/2022 Monday at 08:30 AM 208.8      03/07/2022 Saturday at 07:46 AM 211      03/06/2022 Friday at 06:31 AM 210.8

## 2022-03-13 NOTE — Telephone Encounter (Signed)
RPM data reviewed, weights are stable and decreasing.  We will continue to monitor.

## 2022-03-16 NOTE — Progress Notes (Signed)
Shannon, [name] ([id number]) Visit Report for 03/11/2022 Chief Complaint Document Details Patient Name: Date of Service: Shannon [name], [name] 03/11/2022 9:45 A M Medical Record Number: [id number] Patient Account Number: [id number] Date of Birth/Sex: Treating RN: February 12, 1931 (88 y.o. F) Primary Care Provider: [name] Other Clinician: Referring Provider: Treating Provider/Extender: Theadora Rama in Treatment: 0 Information Obtained from: Patient Chief Complaint Right LE Ulcer Electronic Signature(s) Signed: 03/11/2022 10:42:18 AM By: Worthy Keeler PA-C Entered By: Worthy Keeler on 03/11/2022 10:42:18 -------------------------------------------------------------------------------- HPI Details Patient Name: Date of Service: [name], [place]. 03/11/2022 9:45 A M Medical Record Number: [id number] Patient Account Number: [id number] Date of Birth/Sex: Treating RN: 08/07/1930 (88 y.o. F) Primary Care Provider: [name] Other Clinician: Referring Provider: Treating Provider/Extender: Theadora Rama in Treatment: 0 History of Present Illness HPI Description: 03-11-2022 patient presents today after having struck her right leg on a car door. She subsequently states this was initially a month ago although her daughter said about a year and then to her 2-1/2 months ago. Nonetheless since that time she has been trying to get the wound to heal. This is probably taking so long due to the fact that she does have what appears to be chronic venous insufficiency of the lower extremities bilaterally and she does not wear compression on a regular basis. The good news is however that she does appear to be completely healed based on what we see today. Patient has a history of hypertension, atrial fibrillation, chronic kidney disease stage III, and is on long-term anticoagulant therapy. Electronic Signature(s) Signed: 03/11/2022 11:10:20 AM By: Worthy Keeler  PA-C Entered By: Worthy Keeler on 03/11/2022 11:10:20 -------------------------------------------------------------------------------- Physical Exam Details Patient Name: Date of Service: [name], [name] 03/11/2022 9:45 A M Medical Record Number: [id number] Patient Account Number: [id number] Date of Birth/Sex: Treating RN: 12-23-1930 (88 y.o. F) Primary Care Provider: [name] Other Clinician: Referring Provider: Treating Provider/Extender: Theadora Rama in Treatment: 0 Constitutional patient is hypertensive.. pulse regular and within target range for patient.Marland Kitchen respirations regular, non-labored and within target range for patient.Marland Kitchen temperature within target range for patient.. Well-nourished and well-hydrated in no acute distress. Eyes conjunctiva clear no eyelid edema noted. pupils equal round and reactive to light and accommodation. Ears, Nose, Mouth, and Throat no gross abnormality of ear auricles or external auditory canals. normal hearing noted during conversation. mucus membranes moist. Respiratory normal breathing without difficulty. Cardiovascular 1+ dorsalis pedis/posterior tibialis pulses. 2+ pitting edema of the bilateral lower extremities. Musculoskeletal normal gait and posture. no significant deformity or arthritic changes, no loss or range of motion, no clubbing. Psychiatric this patient is able to make decisions and demonstrates good insight into disease process. Alert and Oriented x 3. pleasant and cooperative. Notes Upon inspection patient's wound actually appears to be completely healed there does not appear to be any evidence of active infection at this time and I do not see anything that is open currently. Overall I am extremely pleased with where we stand and I think she is actually good to be fine for discharge. I do think however she is getting needs some compression stockings and I discussed that with her and her daughter  today. Electronic Signature(s) Signed: 03/11/2022 11:10:49 AM By: Worthy Keeler PA-C Entered By: Worthy Keeler on 03/11/2022 11:10:48 -------------------------------------------------------------------------------- Physician Orders Details Patient Name: Date of Service: [name], [name] 03/11/2022 9:45 A M  Medical Record Number: [id number] Patient Account Number: [id number] Date of Birth/Sex: Treating RN: 03-18-1931 (88 y.o. Shannon [name] Primary Care Provider: [name] Other Clinician: Referring Provider: Treating Provider/Extender: Theadora Rama in Treatment: 0 Verbal / Phone Orders: No Diagnosis Coding ICD-10 Coding Code Description I87.331 Chronic venous hypertension (idiopathic) with ulcer and inflammation of right lower extremity I10 Essential (primary) hypertension I48.0 Paroxysmal atrial fibrillation N18.30 Chronic kidney disease, stage 3 unspecified Z79.01 Long term (current) use of anticoagulants Discharge From [facility] Services Discharge from [place] - Call if any future wound care needs. Edema Control - Lymphedema / SCD / Other Elevate legs to the level of the heart or above for 30 minutes daily and/or when sitting, a frequency of: - 3-4 times a day throughout the day. Avoid standing for long periods of time. Patient to wear own compression stockings every day. - apply in the morning and remove at night. Exercise regularly Moisturize legs daily. - both legs every night. Electronic Signature(s) Signed: 03/11/2022 1:33:45 PM By: Deon Pilling RN, BSN Signed: 03/11/2022 5:27:13 PM By: Worthy Keeler PA-C Entered By: Deon Pilling on 03/11/2022 10:45:39 -------------------------------------------------------------------------------- Problem List Details Patient Name: Date of Service: [name], [name] 03/11/2022 9:45 A M Medical Record Number: [id number] Patient Account Number: [id number] Date of Birth/Sex: Treating RN: 1930-09-24  (88 y.o. F) Primary Care Provider: [name] Other Clinician: Referring Provider: Treating Provider/Extender: Theadora Rama in Treatment: 0 Active Problems ICD-10 Encounter Code Description Active Date MDM Diagnosis I87.331 Chronic venous hypertension (idiopathic) with ulcer and inflammation of right 03/11/2022 No Yes lower extremity I10 Essential (primary) hypertension 03/11/2022 No Yes I48.0 Paroxysmal atrial fibrillation 03/11/2022 No Yes N18.30 Chronic kidney disease, stage 3 unspecified 03/11/2022 No Yes Z79.01 Long term (current) use of anticoagulants 03/11/2022 No Yes Inactive Problems Resolved Problems Electronic Signature(s) Signed: 03/11/2022 10:41:07 AM By: Worthy Keeler PA-C Entered By: Worthy Keeler on 03/11/2022 10:41:07 -------------------------------------------------------------------------------- Progress Note Details Patient Name: Date of Service: [place], [name] 03/11/2022 9:45 A M Medical Record Number: [id number] Patient Account Number: [id number] Date of Birth/Sex: Treating RN: [birth year] (88 y.o. F) Primary Care Provider: [name] Other Clinician: Referring Provider: Treating Provider/Extender: Theadora Rama in Treatment: 0 Subjective Chief Complaint Information obtained from Patient Right LE Ulcer History of Present Illness (HPI) 03-11-2022 patient presents today after having struck her right leg on a car door. She subsequently states this was initially a month ago although her daughter said about a year and then to her 2-1/2 months ago. Nonetheless since that time she has been trying to get the wound to heal. This is probably taking so long due to the fact that she does have what appears to be chronic venous insufficiency of the lower extremities bilaterally and she does not wear compression on a regular basis. The good news is however that she does appear to be completely healed based on what  we see today. Patient has a history of hypertension, atrial fibrillation, chronic kidney disease stage III, and is on long-term anticoagulant therapy. Patient History Information obtained from Patient, Chart. Allergies Zestoretic, spironolactone, amlodipine Family History Heart Disease - Father, Hypertension - Siblings, No family history of Cancer, Diabetes, Hereditary Spherocytosis, Kidney Disease, Lung Disease, Seizures, Stroke, Thyroid Problems, Tuberculosis. Social History Never smoker, Marital Status - Married, Alcohol Use - Never, Drug Use - No History, Caffeine Use - Never. Medical History Eyes Patient has history of Glaucoma  Denies history of Cataracts, Optic Neuritis Ear/Nose/Mouth/Throat Patient has history of Chronic sinus problems/congestion Denies history of Middle ear problems Hematologic/Lymphatic Denies history of Anemia, Hemophilia, Human Immunodeficiency Virus, Lymphedema, Sickle Cell Disease Respiratory Denies history of Aspiration, Asthma, Chronic Obstructive Pulmonary Disease (COPD), Pneumothorax, Sleep Apnea, Tuberculosis Cardiovascular Patient has history of Arrhythmia - A.fib, Congestive Heart Failure, Hypertension Denies history of Angina, Coronary Artery Disease, Deep Vein Thrombosis, Hypotension, Myocardial Infarction, Peripheral Arterial Disease, Peripheral Venous Disease, Phlebitis, Vasculitis Gastrointestinal Denies history of Cirrhosis , Colitis, Crohnoos, Hepatitis A, Hepatitis B, Hepatitis C Immunological Denies history of Lupus Erythematosus, Raynaudoos, Scleroderma Integumentary (Skin) Denies history of History of Burn Musculoskeletal Patient has history of Gout, Osteoarthritis Denies history of Rheumatoid Arthritis, Osteomyelitis Neurologic Denies history of Dementia, Neuropathy, Quadriplegia, Paraplegia, Seizure Disorder Oncologic Denies history of Received Chemotherapy, Received Radiation Psychiatric Denies history of Anorexia/bulimia,  Confinement Anxiety Medical A Surgical History Notes nd Cardiovascular hypercholesterolemia mitral valve insufficiency aortic valve insufficiency Genitourinary CKD stage III renal artery stenosis Review of Systems (ROS) Constitutional Symptoms (General Health) Denies complaints or symptoms of Fatigue, Fever, Chills, Marked Weight Change. Eyes Complains or has symptoms of Glasses / Contacts - sometimes. Denies complaints or symptoms of Dry Eyes, Vision Changes. Ear/Nose/Mouth/Throat Denies complaints or symptoms of Chronic sinus problems or rhinitis. Respiratory Denies complaints or symptoms of Chronic or frequent coughs, Shortness of Breath. Cardiovascular Denies complaints or symptoms of Chest pain. Gastrointestinal Denies complaints or symptoms of Frequent diarrhea, Nausea, Vomiting. Endocrine Denies complaints or symptoms of Heat/cold intolerance. Genitourinary Denies complaints or symptoms of Frequent urination. Integumentary (Skin) Complains or has symptoms of Wounds - Right lower leg. Neurologic Denies complaints or symptoms of Numbness/parasthesias. Psychiatric Denies complaints or symptoms of Claustrophobia. Objective Constitutional patient is hypertensive.. pulse regular and within target range for patient.Marland Kitchen respirations regular, non-labored and within target range for patient.Marland Kitchen temperature within target range for patient.. Well-nourished and well-hydrated in no acute distress. Vitals Time Taken: 10:02 AM, Height: 67 in, Source: Stated, Weight: 208 lbs, Source: Stated, BMI: 32.6, Temperature: 98.2 F, Pulse: 80 bpm, Respiratory Rate: 18 breaths/min, Blood Pressure: 158/82 mmHg. Eyes conjunctiva clear no eyelid edema noted. pupils equal round and reactive to light and accommodation. Ears, Nose, Mouth, and Throat no gross abnormality of ear auricles or external auditory canals. normal hearing noted during conversation. mucus membranes moist. Respiratory normal  breathing without difficulty. Cardiovascular 1+ dorsalis pedis/posterior tibialis pulses. 2+ pitting edema of the bilateral lower extremities. Musculoskeletal normal gait and posture. no significant deformity or arthritic changes, no loss or range of motion, no clubbing. Psychiatric this patient is able to make decisions and demonstrates good insight into disease process. Alert and Oriented x 3. pleasant and cooperative. General Notes: Upon inspection patient's wound actually appears to be completely healed there does not appear to be any evidence of active infection at this time and I do not see anything that is open currently. Overall I am extremely pleased with where we stand and I think she is actually good to be fine for discharge. I do think however she is getting needs some compression stockings and I discussed that with her and her daughter today. Assessment Active Problems ICD-10 Chronic venous hypertension (idiopathic) with ulcer and inflammation of right lower extremity Essential (primary) hypertension Paroxysmal atrial fibrillation Chronic kidney disease, stage 3 unspecified Long term (current) use of anticoagulants Plan Discharge From [facility] Services: Discharge from [place] - Call if any future wound care needs. Edema Control - Lymphedema / SCD / Other: Elevate  legs to the level of the heart or above for 30 minutes daily and/or when sitting, a frequency of: - 3-4 times a day throughout the day. Avoid standing for long periods of time. Patient to wear own compression stockings every day. - apply in the morning and remove at night. Exercise regularly Moisturize legs daily. - both legs every night. 1. I would recommend currently that we have the patient going continue with the recommendation for wound care measures as before and she is in agreement with plan this includes the use of just using some AandD ointment over the area and that she can leave it open otherwise. 2.  I would recommend she needs to have this covered with a sock preferably a compression sock. With that being said until she gets compression socks that fit she may at least need a diabetic socks and will pull up over the area to prevent her from bumping or hitting this and causing additional complications and problems. We will see her back for follow-up visit as needed. Electronic Signature(s) Signed: 03/11/2022 11:11:41 AM By: Worthy Keeler PA-C Entered By: Worthy Keeler on 03/11/2022 11:11:40 -------------------------------------------------------------------------------- HxROS Details Patient Name: Date of Service: [place], [name] 03/11/2022 9:45 A M Medical Record Number: [id number] Patient Account Number: [id number] Date of Birth/Sex: Treating RN: 16-Dec-1930 (88 y.o. Shannon [name] Primary Care Provider: [name] Other Clinician: Referring Provider: Treating Provider/Extender: Gypsy Decant in Treatment: 0 Information Obtained From Patient Chart Constitutional Symptoms (General Health) Complaints and Symptoms: Negative for: Fatigue; Fever; Chills; Marked Weight Change Eyes Complaints and Symptoms: Positive for: Glasses / Contacts - sometimes Negative for: Dry Eyes; Vision Changes Medical History: Positive for: Glaucoma Negative for: Cataracts; Optic Neuritis Ear/Nose/Mouth/Throat Complaints and Symptoms: Negative for: Chronic sinus problems or rhinitis Medical History: Positive for: Chronic sinus problems/congestion Negative for: Middle ear problems Respiratory Complaints and Symptoms: Negative for: Chronic or frequent coughs; Shortness of Breath Medical History: Negative for: Aspiration; Asthma; Chronic Obstructive Pulmonary Disease (COPD); Pneumothorax; Sleep Apnea; Tuberculosis Cardiovascular Complaints and Symptoms: Negative for: Chest pain Medical History: Positive for: Arrhythmia - A.fib; Congestive Heart Failure;  Hypertension Negative for: Angina; Coronary Artery Disease; Deep Vein Thrombosis; Hypotension; Myocardial Infarction; Peripheral Arterial Disease; Peripheral Venous Disease; Phlebitis; Vasculitis Past Medical History Notes: hypercholesterolemia mitral valve insufficiency aortic valve insufficiency Gastrointestinal Complaints and Symptoms: Negative for: Frequent diarrhea; Nausea; Vomiting Medical History: Negative for: Cirrhosis ; Colitis; Crohns; Hepatitis A; Hepatitis B; Hepatitis C Endocrine Complaints and Symptoms: Negative for: Heat/cold intolerance Genitourinary Complaints and Symptoms: Negative for: Frequent urination Medical History: Past Medical History Notes: CKD stage III renal artery stenosis Integumentary (Skin) Complaints and Symptoms: Positive for: Wounds - Right lower leg Medical History: Negative for: History of Burn Neurologic Complaints and Symptoms: Negative for: Numbness/parasthesias Medical History: Negative for: Dementia; Neuropathy; Quadriplegia; Paraplegia; Seizure Disorder Psychiatric Complaints and Symptoms: Negative for: Claustrophobia Medical History: Negative for: Anorexia/bulimia; Confinement Anxiety Hematologic/Lymphatic Medical History: Negative for: Anemia; Hemophilia; Human Immunodeficiency Virus; Lymphedema; Sickle Cell Disease Immunological Medical History: Negative for: Lupus Erythematosus; Raynauds; Scleroderma Musculoskeletal Medical History: Positive for: Gout; Osteoarthritis Negative for: Rheumatoid Arthritis; Osteomyelitis Oncologic Medical History: Negative for: Received Chemotherapy; Received Radiation HBO Extended History Items Ear/Nose/Mouth/Throat: Eyes: Chronic sinus Glaucoma problems/congestion Immunizations Pneumococcal Vaccine: Received Pneumococcal Vaccination: Yes Received Pneumococcal Vaccination On or After 60th Birthday: Yes Implantable Devices None Family and Social History Cancer: No; Diabetes:  No; Heart Disease: Yes - Father; Hereditary Spherocytosis: No; Hypertension: Yes - Siblings; Kidney Disease: No;  Lung Disease: No; Seizures: No; Stroke: No; Thyroid Problems: No; Tuberculosis: No; Never smoker; Marital Status - Married; Alcohol Use: Never; Drug Use: No History; Caffeine Use: Never; Financial Concerns: No; Food, Clothing or Shelter Needs: No; Support System Lacking: No; Transportation Concerns: No Engineer, maintenance) Signed: 03/11/2022 1:33:45 PM By: Deon Pilling RN, BSN Signed: 03/11/2022 4:38:36 PM By: Erenest Blank Signed: 03/16/2022 1:44:59 PM By: Kalman Shan DO Entered By: Erenest Blank on 03/11/2022 10:13:49 -------------------------------------------------------------------------------- SuperBill Details Patient Name: Date of Service: [place], [place]. 03/11/2022 Medical Record Number: [id number] Patient Account Number: [id number] Date of Birth/Sex: Treating RN: [birth year] (88 y.o. Shannon [name] Primary Care Provider: [name] Other Clinician: Referring Provider: Treating Provider/Extender: Theadora Rama in Treatment: 0 Diagnosis Coding ICD-10 Codes Code Description [phone] Chronic venous hypertension (idiopathic) with ulcer and inflammation of right lower extremity I10 Essential (primary) hypertension I48.0 Paroxysmal atrial fibrillation N18.30 Chronic kidney disease, stage 3 unspecified Z79.01 Long term (current) use of anticoagulants Facility Procedures CPT4 Code: [id number] Description: 99213 - WOUND CARE VISIT-LEV 3 EST PT Modifier: Quantity: 1 Physician Procedures : CPT4 Code Description Modifier [id number] WC PHYS LEVEL 3 NEW PT ICD-10 Diagnosis Description I87.331 Chronic venous hypertension (idiopathic) with ulcer and inflammation of right lower extremity I10 Essential (primary) hypertension I48.0 Paroxysmal  atrial fibrillation N18.30 Chronic kidney disease, stage 3 unspecified Quantity: 1 Electronic  Signature(s) Signed: 03/11/2022 11:12:01 AM By: Worthy Keeler PA-C Entered By: Worthy Keeler on 03/11/2022 11:12:00

## 2022-03-31 ENCOUNTER — Ambulatory Visit: Payer: PRIVATE HEALTH INSURANCE

## 2022-03-31 DIAGNOSIS — I5033 Acute on chronic diastolic (congestive) heart failure: Secondary | ICD-10-CM

## 2022-03-31 DIAGNOSIS — I4821 Permanent atrial fibrillation: Secondary | ICD-10-CM

## 2022-04-07 ENCOUNTER — Ambulatory Visit: Payer: PRIVATE HEALTH INSURANCE | Admitting: Cardiology

## 2022-04-07 NOTE — Progress Notes (Deleted)
Subjective:  Primary Physician:  [name], MD  Patient ID: Shannon [name], female    DOB: Feb 10, 1931, 88 y.o.   MRN: [id number]  No chief complaint on file.  HPI: Shannon [name]  is a 88 y.o. female  with  paroxysmal A. Fib S/P cardioversion 10/2016, difficult to control hypertension (No RAS by angio in 2016), stage IIIa-b chronic kidney disease, hyperlipidemia, obesity, frequent PVCs and wandering atrial pacemaker by electrocardiogram and history of gout.  Nuclear stress in Apr 2018 was non ischemic.  Patient had not been seen by me for most 3 years now, was referred back to me for evaluation of heart failure.  Over the past few weeks patient has noticed gradually worsening dyspnea and also leg edema and 2 to 3 weeks ago developed right lower extremity ulceration on the lateral aspect of her leg.  She was started on Jardiance and referred to me.  Since being on Jardiance, patient has noticed increased urine output.  Leg edema is still persistent.  She denies chest pain, palpitations.  No PND but does sleep reclined.   Past Medical History:  Diagnosis Date   Acute exacerbation of CHF (congestive heart failure) ([place]) 09/24/2017   Arthritis    "all over"   Atrial fibrillation (HCC)    Chronic diastolic CHF (congestive heart failure) ([place]) 09/24/2017   Chronic kidney disease    Dyspnea    Gout    Hypercholesteremia    Hypertension    Mitral regurgitation     Past Surgical History:  Procedure Laterality Date   BREAST BIOPSY Left 1984   CARDIOVERSION N/A 10/27/2016   Procedure: CARDIOVERSION;  Surgeon: Adrian Prows, MD;  Location: [place];  Service: Cardiovascular;  Laterality: N/A;   CATARACT EXTRACTION, BILATERAL Bilateral 2000's   RENAL ANGIOGRAM Bilateral 07/31/2014   Procedure: RENAL ANGIOGRAM;  Surgeon: Laverda Page, MD;  Location: [facility] CATH LAB;  Service: Cardiovascular;  Laterality: Bilateral;   Social History   Tobacco Use   Smoking status: Never    Smokeless tobacco: Former    Types: Snuff   Tobacco comments:    "quit using snuff in the 1960's"  Substance Use Topics   Alcohol use: No  Marital Status: Married    No orders of the defined types were placed in this encounter.  Review of Systems  Cardiovascular:  Positive for dyspnea on exertion and leg swelling. Negative for chest pain.      Objective:      03/05/2022   10:55 AM 11/14/2021    9:00 PM 11/14/2021    8:52 PM  Vitals with BMI  Height 5' 7"     Weight 215 lbs 13 oz    BMI 06.23    Systolic 762 831 517  Diastolic 68 96 97  Pulse 76 91 90    Physical Exam Neck:     Vascular: No carotid bruit or JVD.  Cardiovascular:     Rate and Rhythm: Normal rate. Rhythm irregular.     Pulses: Normal pulses and intact distal pulses.     Heart sounds: No murmur heard. Pulmonary:     Effort: Pulmonary effort is normal.     Breath sounds: Normal breath sounds.  Abdominal:     General: Bowel sounds are normal.     Palpations: Abdomen is soft.  Musculoskeletal:     Right lower leg: Edema (2-3+ pitting, right lateral leg superficial ulceration, dry) present.     Left lower leg: Edema (2-3+ pitting below knee)  present.  Skin:    Capillary Refill: Capillary refill takes less than 2 seconds.     Radiology: No results found.  Laboratory Examination:  Lab Results  Component Value Date   NA 140 11/14/2021   K 4.3 11/14/2021   CO2 28 11/14/2021   GLUCOSE 103 (H) 11/14/2021   BUN 21 11/14/2021   CREATININE 1.24 (H) 11/14/2021   CALCIUM 9.5 11/14/2021   GFRNONAA 42 (L) 11/14/2021       Latest Ref Rng & Units 11/14/2021    7:00 PM 07/11/2020   12:53 AM 07/10/2020   12:27 AM  CMP  Glucose 70 - 99 mg/dL 103  155  164   BUN 8 - 23 mg/dL 21  52  49   Creatinine 0.44 - 1.00 mg/dL 1.24  1.85  2.01   Sodium 135 - 145 mmol/L 140  138  138   Potassium 3.5 - 5.1 mmol/L 4.3  4.6  4.3   Chloride 98 - 111 mmol/L 104  108  105   CO2 22 - 32 mmol/L 28  21  20    Calcium 8.9 - 10.3  mg/dL 9.5  8.6  8.7   Total Protein 6.5 - 8.1 g/dL  6.3  6.5   Total Bilirubin 0.3 - 1.2 mg/dL  0.7  0.6   Alkaline Phos 38 - 126 U/L  58  54   AST 15 - 41 U/L  27  20   ALT 0 - 44 U/L  19  15       Latest Ref Rng & Units 11/14/2021    7:00 PM 07/11/2020   12:53 AM 07/10/2020   12:27 AM  CBC  WBC 4.0 - 10.5 K/uL 6.5  9.1  9.5   Hemoglobin 12.0 - 15.0 g/dL 12.7  12.3  12.1   Hematocrit 36.0 - 46.0 % 40.0  37.9  38.5   Platelets 150 - 400 K/uL 186  281  338    Lipid Panel     Component Value Date/Time   TRIG 91 07/07/2020 1058   HEMOGLOBIN A1C Lab Results  Component Value Date   HGBA1C 5.5 09/24/2017   MPG 111.15 09/24/2017   TSH No results for input(s): "TSH" in the last 8760 hours.  BNP (last 3 results) Recent Labs    11/14/21 1900  BNP 305.2*    ProBNP (last 3 results) No results for input(s): "PROBNP" in the last 8760 hours.  External Labs:  Labs 02/20/2022:  proBNP 943.  Sodium 141, potassium 3.9, BUN 25, creatinine 1.47, EGFR 36.  mL.  A1c 5.2%.  Total cholesterol 159, triglycerides 69, HDL 50, LDL 95.  Allergies  Allergen Reactions   Amlodipine Swelling    Intolerance, LE edema    Zestoretic [Lisinopril-Hydrochlorothiazide] Itching and Rash     Current Outpatient Medications:    empagliflozin (JARDIANCE) 10 MG TABS tablet, Take 10 mg by mouth daily., Disp: , Rfl:    hydrALAZINE (APRESOLINE) 100 MG tablet, Take 100 mg by mouth 3 (three) times daily., Disp: , Rfl:    isosorbide dinitrate (ISORDIL) 30 MG tablet, TAKE 1 TABLET(30 MG) BY MOUTH THREE TIMES DAILY, Disp: 90 tablet, Rfl: 2   pindolol (VISKEN) 5 MG tablet, TAKE 1 TABLET(5 MG) BY MOUTH TWICE DAILY, Disp: 180 tablet, Rfl: 1   Potassium Chloride ER 20 MEQ TBCR, Take 1 tablet by mouth daily., Disp: , Rfl:    spironolactone (ALDACTONE) 25 MG tablet, Take 0.5 tablets (12.5 mg total) by mouth every morning., Disp:  30 tablet, Rfl: 2   torsemide (DEMADEX) 20 MG tablet, Take 2 tablets (40 mg total) by  mouth 2 (two) times daily at 10 am and 4 pm. Two tablets BID for 3 days only starting 09/19/17, Disp: 180 tablet, Rfl: 3   XARELTO 15 MG TABS tablet, TAKE 1 TABLET BY MOUTH EVERY EVENING AFTER DINNER, Disp: 90 tablet, Rfl: 0   CARDIAC STUDIES:   Renal Angio  [07/31/2014]: Left mild disease. Right mild to moderate disease. Unable to perform pressure pull back to evaluate significance. Does not appear high grade.   Lexiscan myoview stress test 10/12/2016: 1. The resting electrocardiogram demonstrated atrial fibrillation, normal resting conduction and nonspecific T changes. Stress EKG is non-diagnostic for ischemia as it a pharmacologic stress using Lexiscan. Rare PVC. Stress symptoms included dyspnea. 2. Left ventricular cavity is noted to be enlarged on the rest and stress studies with LV end diastolic volume of 025EN. SPECT images demonstrate homogeneous tracer distribution throughout the myocardium. No ischemia or scar. The left ventricular ejection fraction was calculated to be 41%. The ejection fraction may not be accurate and may be related to difficulty in getting EKG due to atrial fibrillation. This is a low risk study.  Echocardiogram   Hospital 09/25/2017: Low normal LVEF at 27-78%, grade 2 diastolic dysfunction. Severely dilated left atrium and right atrium, mild RV dilatation with mild RVH. Moderate pulmonary hypertension, PA pressure 49 mmHg.  LE segmental pressure 09/27/2017: Right: Resting right ankle-brachial index is within normal range. No evidence of significant right lower extremity arterial disease. The right toe-brachial index is normal. RT great toe pressure = 106 mmHg. Left: Resting left ankle-brachial index is within normal range. No evidence of significant left lower extremity arterial disease. The left toe-brachial index is normal. LT Great toe pressure = 107 mmHg. Left second toe pressure = 71 mmHg.  EKG:  EKG 03/05/2022: Atrial fibrillation with controlled ventricular  response at rate of 76 bpm, normal axis, anteroseptal infarct old.  No evidence of ischemia, normal QT interval.  Low-voltage complexes.  Consider pulmonary disease pattern.  Compared to compared to 03/29/2019, no significant change.   Assessment:       ICD-10-CM   1. Acute on chronic diastolic heart failure (HCC)  I50.33     2. Permanent atrial fibrillation (HCC)  I48.21     3. Primary hypertension  I10       CHA2DS2-VASc Score is 5.  Yearly risk of stroke: 7% (F, Age, HTN, CHF).  Score of 1=0.6; 2=2.2; 3=3.2; 4=4.8; 5=7.2; 6=9.8; 7=>9.8) -(CHF; HTN; vasc disease DM,  Female = 1; Age <65 =0; 65-74 = 1,  >75 =2; stroke/embolism= 2).     Recommendations:   Texanna Hilburn s a 88 y.o. African-American female patient with  paroxysmal A. Fib S/P cardioversion 10/2016, difficult to control hypertension (No RAS by angio in 2016), stage IIIb chronic kidney disease, hyperlipidemia, obesity, frequent PVCs and wandering atrial pacemaker by electrocardiogram and history of gout.  Nuclear stress in Apr 2018 was non ischemic.  Patient had not been seen by me for most 3 years now, was referred back to me for evaluation of heart failure.  The ulceration appears very superficial.  I have advised her complete bedrest for now, to keep her foot elevated except when she is ambulatory.  I have added 12.5 mg of Aldactone, continue Jardiance, I have given her samples also.  She needs repeat echocardiogram.  I have discussed with her regarding salt restriction and also fluid  restriction for now.  Continue present dose diuretics without increasing them as she is now on Jardiance.  Blood pressure is well controlled, she does have stage IIIb chronic kidney disease, presently on Xarelto for anticoagulation, continue the same and rate is well controlled on low-dose beta-blocker therapy.  She will need repeat BMP and BNP/proBNP in 2 weeks prior to her next office visit may in 2 weeks.  I will enroll in RPM for  heart failure management as she is extremely high risk for hospitalization.  I have reviewed her labs from Dr. Ashby Dawes dated 02/20/2022.  We will continue to monitor her very closely.    Adrian Prows, MD, [facility] 04/07/2022, 11:49 AM Office: [phone] Fax: [phone] Pager: [phone]

## 2022-04-08 ENCOUNTER — Ambulatory Visit: Payer: PRIVATE HEALTH INSURANCE | Admitting: Cardiology

## 2022-04-08 ENCOUNTER — Encounter: Payer: Self-pay | Admitting: Cardiology

## 2022-04-08 VITALS — BP 157/71 | HR 79 | Temp 97.5°F | Resp 15 | Ht 67.0 in | Wt 213.0 lb

## 2022-04-08 DIAGNOSIS — N1832 Chronic kidney disease, stage 3b: Secondary | ICD-10-CM

## 2022-04-08 DIAGNOSIS — I10 Essential (primary) hypertension: Secondary | ICD-10-CM

## 2022-04-08 DIAGNOSIS — I4821 Permanent atrial fibrillation: Secondary | ICD-10-CM

## 2022-04-08 DIAGNOSIS — I5033 Acute on chronic diastolic (congestive) heart failure: Secondary | ICD-10-CM

## 2022-04-08 LAB — BASIC METABOLIC PANEL
BUN/Creatinine Ratio: 16 (ref 12–28)
BUN: 23 mg/dL (ref 8–27)
CO2: 24 mmol/L (ref 20–29)
Calcium: 9.2 mg/dL (ref 8.7–10.3)
Chloride: 100 mmol/L (ref 96–106)
Creatinine, Ser: 1.42 mg/dL — ABNORMAL HIGH (ref 0.57–1.00)
Glucose: 96 mg/dL (ref 70–99)
Potassium: 3.7 mmol/L (ref 3.5–5.2)
Sodium: 141 mmol/L (ref 134–144)
eGFR: 35 mL/min/{1.73_m2} — ABNORMAL LOW (ref >59)

## 2022-04-08 LAB — PRO B NATRIURETIC PEPTIDE: NT-Pro BNP: 1126 pg/mL — ABNORMAL HIGH (ref 0–738)

## 2022-04-08 MED ORDER — SPIRONOLACTONE 25 MG PO TABS: 12.5000 mg | ORAL_TABLET | ORAL | 2 refills | Status: DC

## 2022-04-08 MED ORDER — ISOSORBIDE DINITRATE 30 MG PO TABS: ORAL_TABLET | ORAL | 2 refills | Status: DC

## 2022-04-08 MED ORDER — EMPAGLIFLOZIN 10 MG PO TABS: 10.0000 mg | ORAL_TABLET | Freq: Every day | ORAL | 3 refills | Status: DC

## 2022-04-08 NOTE — Progress Notes (Signed)
Stable renal function and persistent elevation in ProBNP   Labs 02/20/2022:  proBNP 943.

## 2022-04-08 NOTE — Progress Notes (Signed)
Subjective:  Primary Physician:  [name], MD  Patient ID: Shannon [name], female    DOB: 09/08/1930, 88 y.o.   MRN: [id number]  Chief Complaint  Patient presents with   Acute on chronic diastolic heart failure    Follow-up    2 weeks   HPI: Shannon [name]  is a 88 y.o. female  with  paroxysmal A. Fib S/P cardioversion 10/2016, difficult to control hypertension (No RAS by angio in 2016), stage IIIa-b chronic kidney disease, hyperlipidemia, obesity, frequent PVCs and wandering atrial pacemaker by electrocardiogram and history of gout.  Nuclear stress in Apr 2018 was non ischemic.  Patient had not been seen by me for most 3 years now, was referred back to me for evaluation of heart failure.  Over the past few weeks patient has noticed gradually worsening dyspnea and also leg edema and 2 to 3 weeks ago developed right lower extremity ulceration on the lateral aspect of her leg.  She was started on Jardiance and referred to me.  Since being on Jardiance, patient has noticed increased urine output.  Leg edema is still persistent.  She denies chest pain, palpitations.  No PND but does sleep reclined.   Past Medical History:  Diagnosis Date   Acute exacerbation of CHF (congestive heart failure) ([place]) 09/24/2017   Arthritis    "all over"   Atrial fibrillation (HCC)    Chronic diastolic CHF (congestive heart failure) ([place]) 09/24/2017   Chronic kidney disease    Dyspnea    Gout    Hypercholesteremia    Hypertension    Mitral regurgitation     Past Surgical History:  Procedure Laterality Date   BREAST BIOPSY Left 1984   CARDIOVERSION N/A 10/27/2016   Procedure: CARDIOVERSION;  Surgeon: Adrian Prows, MD;  Location: [place];  Service: Cardiovascular;  Laterality: N/A;   CATARACT EXTRACTION, BILATERAL Bilateral 2000's   RENAL ANGIOGRAM Bilateral 07/31/2014   Procedure: RENAL ANGIOGRAM;  Surgeon: Laverda Page, MD;  Location: [facility] CATH LAB;  Service: Cardiovascular;   Laterality: Bilateral;   Social History   Tobacco Use   Smoking status: Never   Smokeless tobacco: Former    Types: Snuff   Tobacco comments:    "quit using snuff in the 1960's"  Substance Use Topics   Alcohol use: No  Marital Status: Married    Orders Placed This Encounter  Procedures   Basic metabolic panel   Pro b natriuretic peptide (BNP)   Review of Systems  Cardiovascular:  Positive for dyspnea on exertion and leg swelling. Negative for chest pain.      Objective:      04/08/2022    9:51 AM 03/05/2022   10:55 AM 11/14/2021    9:00 PM  Vitals with BMI  Height 5' 7"  5' 7"    Weight 213 lbs 215 lbs 13 oz   BMI 75.91 63.84   Systolic 665 993 570  Diastolic 71 68 96  Pulse 79 76 91    Physical Exam Neck:     Vascular: No carotid bruit or JVD.  Cardiovascular:     Rate and Rhythm: Normal rate. Rhythm irregular.     Pulses: Normal pulses and intact distal pulses.     Heart sounds: No murmur heard. Pulmonary:     Effort: Pulmonary effort is normal.     Breath sounds: Normal breath sounds.  Abdominal:     General: Bowel sounds are normal.     Palpations: Abdomen is soft.  Musculoskeletal:  Right lower leg: Edema (2-3+ pitting,) present.     Left lower leg: Edema (2-3+ pitting below knee) present.  Skin:    Capillary Refill: Capillary refill takes less than 2 seconds.     Radiology: No results found.  Laboratory Examination:  Lab Results  Component Value Date   NA 141 04/06/2022   K 3.7 04/06/2022   CO2 24 04/06/2022   GLUCOSE 96 04/06/2022   BUN 23 04/06/2022   CREATININE 1.42 (H) 04/06/2022   CALCIUM 9.2 04/06/2022   EGFR 35 (L) 04/06/2022   GFRNONAA 42 (L) 11/14/2021       Latest Ref Rng & Units 04/06/2022    1:09 PM 11/14/2021    7:00 PM 07/11/2020   12:53 AM  CMP  Glucose 70 - 99 mg/dL 96  103  155   BUN 8 - 27 mg/dL 23  21  52   Creatinine 0.57 - 1.00 mg/dL 1.42  1.24  1.85   Sodium 134 - 144 mmol/L 141  140  138   Potassium 3.5 -  5.2 mmol/L 3.7  4.3  4.6   Chloride 96 - 106 mmol/L 100  104  108   CO2 20 - 29 mmol/L 24  28  21    Calcium 8.7 - 10.3 mg/dL 9.2  9.5  8.6   Total Protein 6.5 - 8.1 g/dL   6.3   Total Bilirubin 0.3 - 1.2 mg/dL   0.7   Alkaline Phos 38 - 126 U/L   58   AST 15 - 41 U/L   27   ALT 0 - 44 U/L   19       Latest Ref Rng & Units 11/14/2021    7:00 PM 07/11/2020   12:53 AM 07/10/2020   12:27 AM  CBC  WBC 4.0 - 10.5 K/uL 6.5  9.1  9.5   Hemoglobin 12.0 - 15.0 g/dL 12.7  12.3  12.1   Hematocrit 36.0 - 46.0 % 40.0  37.9  38.5   Platelets 150 - 400 K/uL 186  281  338    Lipid Panel     Component Value Date/Time   TRIG 91 07/07/2020 1058   HEMOGLOBIN A1C Lab Results  Component Value Date   HGBA1C 5.5 09/24/2017   MPG 111.15 09/24/2017   TSH No results for input(s): "TSH" in the last 8760 hours.  BNP (last 3 results) Recent Labs    11/14/21 1900  BNP 305.2*   ProBNP (last 3 results) Recent Labs    04/06/22 1309  PROBNP 1,126*    External Labs:  Labs 02/20/2022:  proBNP 943.  Sodium 141, potassium 3.9, BUN 25, creatinine 1.47, EGFR 36.  mL.  A1c 5.2%.  Total cholesterol 159, triglycerides 69, HDL 50, LDL 95.  Allergies  Allergen Reactions   Amlodipine Swelling    Intolerance, LE edema    Zestoretic [Lisinopril-Hydrochlorothiazide] Itching and Rash     Current Outpatient Medications:    hydrALAZINE (APRESOLINE) 100 MG tablet, Take 100 mg by mouth 3 (three) times daily., Disp: , Rfl:    pindolol (VISKEN) 5 MG tablet, TAKE 1 TABLET(5 MG) BY MOUTH TWICE DAILY, Disp: 180 tablet, Rfl: 1   torsemide (DEMADEX) 20 MG tablet, Take 2 tablets (40 mg total) by mouth 2 (two) times daily at 10 am and 4 pm. Two tablets BID for 3 days only starting 09/19/17, Disp: 180 tablet, Rfl: 3   XARELTO 15 MG TABS tablet, TAKE 1 TABLET BY MOUTH EVERY EVENING AFTER DINNER,  Disp: 90 tablet, Rfl: 0   empagliflozin (JARDIANCE) 10 MG TABS tablet, Take 1 tablet (10 mg total) by mouth daily., Disp: 30  tablet, Rfl: 3   isosorbide dinitrate (ISORDIL) 30 MG tablet, TAKE 1 TABLET(30 MG) BY MOUTH THREE TIMES DAILY, Disp: 90 tablet, Rfl: 2   spironolactone (ALDACTONE) 25 MG tablet, Take 0.5 tablets (12.5 mg total) by mouth every morning., Disp: 30 tablet, Rfl: 2   CARDIAC STUDIES:   Renal Angio  [07/31/2014]: Left mild disease. Right mild to moderate disease. Unable to perform pressure pull back to evaluate significance. Does not appear high grade.   Lexiscan myoview stress test 10/12/2016: 1. The resting electrocardiogram demonstrated atrial fibrillation, normal resting conduction and nonspecific T changes. Stress EKG is non-diagnostic for ischemia as it a pharmacologic stress using Lexiscan. Rare PVC. Stress symptoms included dyspnea. 2. Left ventricular cavity is noted to be enlarged on the rest and stress studies with LV end diastolic volume of 673AL. SPECT images demonstrate homogeneous tracer distribution throughout the myocardium. No ischemia or scar. The left ventricular ejection fraction was calculated to be 41%. The ejection fraction may not be accurate and may be related to difficulty in getting EKG due to atrial fibrillation. This is a low risk study.  Echocardiogram   Hospital 09/25/2017: Low normal LVEF at 93-79%, grade 2 diastolic dysfunction. Severely dilated left atrium and right atrium, mild RV dilatation with mild RVH. Moderate pulmonary hypertension, PA pressure 49 mmHg.  LE segmental pressure 09/27/2017: Right: Resting right ankle-brachial index is within normal range. No evidence of significant right lower extremity arterial disease. The right toe-brachial index is normal. RT great toe pressure = 106 mmHg. Left: Resting left ankle-brachial index is within normal range. No evidence of significant left lower extremity arterial disease. The left toe-brachial index is normal. LT Great toe pressure = 107 mmHg. Left second toe pressure = 71 mmHg.  EKG:  EKG 03/05/2022: Atrial  fibrillation with controlled ventricular response at rate of 76 bpm, normal axis, anteroseptal infarct old.  No evidence of ischemia, normal QT interval.  Low-voltage complexes.  Consider pulmonary disease pattern.  Compared to compared to 03/29/2019, no significant change.   Assessment:       ICD-10-CM   1. Acute on chronic diastolic heart failure (HCC)  I50.33 empagliflozin (JARDIANCE) 10 MG TABS tablet    spironolactone (ALDACTONE) 25 MG tablet    Basic metabolic panel    Pro b natriuretic peptide (BNP)    isosorbide dinitrate (ISORDIL) 30 MG tablet    2. Permanent atrial fibrillation (HCC)  I48.21     3. Stage 3b chronic kidney disease (HCC)  N18.32     4. Primary hypertension  I10 isosorbide dinitrate (ISORDIL) 30 MG tablet      CHA2DS2-VASc Score is 5.  Yearly risk of stroke: 7% (F, Age, HTN, CHF).  Score of 1=0.6; 2=2.2; 3=3.2; 4=4.8; 5=7.2; 6=9.8; 7=>9.8) -(CHF; HTN; vasc disease DM,  Female = 1; Age <65 =0; 65-74 = 1,  >75 =2; stroke/embolism= 2).    Medications Discontinued During This Encounter  Medication Reason   empagliflozin (JARDIANCE) 10 MG TABS tablet    isosorbide dinitrate (ISORDIL) 30 MG tablet    Potassium Chloride ER 20 MEQ TBCR    spironolactone (ALDACTONE) 25 MG tablet     Meds ordered this encounter  Medications   empagliflozin (JARDIANCE) 10 MG TABS tablet    Sig: Take 1 tablet (10 mg total) by mouth daily.    Dispense:  30 tablet  Refill:  3   spironolactone (ALDACTONE) 25 MG tablet    Sig: Take 0.5 tablets (12.5 mg total) by mouth every morning.    Dispense:  30 tablet    Refill:  2   isosorbide dinitrate (ISORDIL) 30 MG tablet    Sig: TAKE 1 TABLET(30 MG) BY MOUTH THREE TIMES DAILY    Dispense:  90 tablet    Refill:  2     Recommendations:   Shannon [name] s a 88 y.o. African-American female patient with  paroxysmal A. Fib S/P cardioversion 10/2016, difficult to control hypertension (No RAS by angio in 2016), stage IIIb chronic  kidney disease, hyperlipidemia, obesity, frequent PVCs and wandering atrial pacemaker by electrocardiogram and history of gout.  Nuclear stress in Apr 2018 was non ischemic.  Patient was seen by me 3 weeks ago and presents for close monitoring of heart failure.  She had gained weight back, but on further questioning, she finished her Jardiance and did not pick up the refills as it [facility] pharmacy, she has changed pharmacies.  She did not pick up the spironolactone as well.  Fortunately the right lower extremity superficial ulceration from 4+ leg edema is now resolved, but she still has 2-3+ leg edema and her weight is down by about 2 to 3 pounds and her dyspnea is improved.  Today the lungs are clear.  But I am concerned that she will go back into heart failure again.  Patient is enrolled in RPM. 04/08/2022 Wednesday at 08:20 AM 209.4      04/08/2022 Wednesday at 08:11 AM 208.8      04/06/2022 Monday at 08:31 AM 206.4      04/06/2022 Monday at 08:31 AM 206.4      04/03/2022 Friday at 08:29 AM 205      04/03/2022 Friday at 08:29 AM 205      03/30/2022 Monday at 08:25 AM 204.2  I have given her samples of the Jardiance again, advised him to go pick up the spironolactone.  I also called her daughter Shannon [name] over the telephone and discussed with her regarding diet again, advised her that she cannot have soups protein or canned soups, fried foods, fast foods.  Blood pressure is elevated today, I restarted her back on spironolactone along with isosorbide dinitrate.  She will continue with hydralazine.  I will repeat NT proBNP and also BMP in 2 weeks and I will like to see her back then.    Adrian Prows, MD, [facility] 04/08/2022, 10:27 AM Office: [phone] Fax: [phone] Pager: [phone]

## 2022-04-20 DIAGNOSIS — I5032 Chronic diastolic (congestive) heart failure: Secondary | ICD-10-CM | POA: Diagnosis not present

## 2022-04-20 DIAGNOSIS — I130 Hypertensive heart and chronic kidney disease with heart failure and stage 1 through stage 4 chronic kidney disease, or unspecified chronic kidney disease: Secondary | ICD-10-CM | POA: Diagnosis not present

## 2022-04-24 DIAGNOSIS — I5033 Acute on chronic diastolic (congestive) heart failure: Secondary | ICD-10-CM | POA: Diagnosis not present

## 2022-04-25 LAB — BASIC METABOLIC PANEL
BUN/Creatinine Ratio: 23 (ref 12–28)
BUN: 33 mg/dL — ABNORMAL HIGH (ref 8–27)
CO2: 29 mmol/L (ref 20–29)
Calcium: 8.9 mg/dL (ref 8.7–10.3)
Chloride: 100 mmol/L (ref 96–106)
Creatinine, Ser: 1.42 mg/dL — ABNORMAL HIGH (ref 0.57–1.00)
Glucose: 81 mg/dL (ref 70–99)
Potassium: 4.6 mmol/L (ref 3.5–5.2)
Sodium: 140 mmol/L (ref 134–144)
eGFR: 35 mL/min/{1.73_m2} — ABNORMAL LOW (ref >59)

## 2022-04-25 LAB — PRO B NATRIURETIC PEPTIDE: NT-Pro BNP: 832 pg/mL — ABNORMAL HIGH (ref 0–738)

## 2022-04-28 ENCOUNTER — Encounter: Payer: Self-pay | Admitting: Cardiology

## 2022-04-28 ENCOUNTER — Ambulatory Visit: Payer: Medicare Other | Admitting: Cardiology

## 2022-04-28 VITALS — BP 137/80 | HR 68 | Ht 67.0 in | Wt 207.4 lb

## 2022-04-28 DIAGNOSIS — I10 Essential (primary) hypertension: Secondary | ICD-10-CM | POA: Diagnosis not present

## 2022-04-28 DIAGNOSIS — I5033 Acute on chronic diastolic (congestive) heart failure: Secondary | ICD-10-CM | POA: Diagnosis not present

## 2022-04-28 DIAGNOSIS — I4821 Permanent atrial fibrillation: Secondary | ICD-10-CM | POA: Diagnosis not present

## 2022-04-28 NOTE — Progress Notes (Signed)
Subjective:  Primary Physician:  [name], MD  Patient ID: Shannon [name], female    DOB: Mar 08, 1931, 88 y.o.   MRN: [id number]  Chief Complaint  Patient presents with   Acute on chronic diastolic heart failure   Follow-up   HPI: Shannon [name]  is a 88 y.o. female  with permanent A. Fib S/P cardioversion 10/2016, difficult to control hypertension (No RAS by angio in 2016), stage IIIa-b chronic kidney disease, hyperlipidemia, obesity, frequent PVCs by electrocardiogram and history of gout.    This is a 6-week office visit for heart failure.  Since being on medical therapy, leg edema is improved, she has lost additional 4 to 6 pounds in weight since last office visit.  She has no specific complaints today and states that she is back to her baseline.  Past Medical History:  Diagnosis Date   Acute exacerbation of CHF (congestive heart failure) ([place]) 09/24/2017   Arthritis    "all over"   Atrial fibrillation (HCC)    Chronic diastolic CHF (congestive heart failure) ([place]) 09/24/2017   Chronic kidney disease    Dyspnea    Gout    Hypercholesteremia    Hypertension    Mitral regurgitation     Past Surgical History:  Procedure Laterality Date   BREAST BIOPSY Left 1984   CARDIOVERSION N/A 10/27/2016   Procedure: CARDIOVERSION;  Surgeon: Adrian Prows, MD;  Location: [place];  Service: Cardiovascular;  Laterality: N/A;   CATARACT EXTRACTION, BILATERAL Bilateral 2000's   RENAL ANGIOGRAM Bilateral 07/31/2014   Procedure: RENAL ANGIOGRAM;  Surgeon: Laverda Page, MD;  Location: [facility] CATH LAB;  Service: Cardiovascular;  Laterality: Bilateral;   Social History   Tobacco Use   Smoking status: Never   Smokeless tobacco: Former    Types: Snuff   Tobacco comments:    "quit using snuff in the 1960's"  Substance Use Topics   Alcohol use: No  Marital Status: Married    Orders Placed This Encounter  Procedures   EKG 12-Lead   Review of Systems  Cardiovascular:   Positive for dyspnea on exertion and leg swelling. Negative for chest pain.   Objective:      04/28/2022   11:14 AM 04/28/2022   11:04 AM 04/08/2022    9:51 AM  Vitals with BMI  Height  _0  _1   Weight  207 lbs 6 oz 213 lbs  BMI  44.92 01.00  Systolic 712 197 588  Diastolic 80 78 71  Pulse 68 65 79    Physical Exam Neck:     Vascular: No carotid bruit or JVD.  Cardiovascular:     Rate and Rhythm: Normal rate. Rhythm irregular.     Pulses: Normal pulses and intact distal pulses.     Heart sounds: No murmur heard. Pulmonary:     Effort: Pulmonary effort is normal.     Breath sounds: Normal breath sounds.  Abdominal:     General: Bowel sounds are normal.     Palpations: Abdomen is soft.  Musculoskeletal:     Right lower leg: Edema (2-3+ pitting,) present.     Left lower leg: Edema (2-3+ pitting below knee) present.  Skin:    Capillary Refill: Capillary refill takes less than 2 seconds.    Radiology: No results found.  Laboratory Examination:  Lab Results  Component Value Date   NA 140 04/24/2022   K 4.6 04/24/2022   CO2 29 04/24/2022   GLUCOSE 81 04/24/2022   BUN  33 (H) 04/24/2022   CREATININE 1.42 (H) 04/24/2022   CALCIUM 8.9 04/24/2022   EGFR 35 (L) 04/24/2022   GFRNONAA 42 (L) 11/14/2021       Latest Ref Rng & Units 04/24/2022    3:16 PM 04/06/2022    1:09 PM 11/14/2021    7:00 PM  CMP  Glucose 70 - 99 mg/dL 81  96  103   BUN 8 - 27 mg/dL 33  23  21   Creatinine 0.57 - 1.00 mg/dL 1.42  1.42  1.24   Sodium 134 - 144 mmol/L 140  141  140   Potassium 3.5 - 5.2 mmol/L 4.6  3.7  4.3   Chloride 96 - 106 mmol/L 100  100  104   CO2 20 - 29 mmol/L _0 Calcium 8.7 - 10.3 mg/dL 8.9  9.2  9.5       Latest Ref Rng & Units 11/14/2021    7:00 PM 07/11/2020   12:53 AM 07/10/2020   12:27 AM  CBC  WBC 4.0 - 10.5 K/uL 6.5  9.1  9.5   Hemoglobin 12.0 - 15.0 g/dL 12.7  12.3  12.1   Hematocrit 36.0 - 46.0 % 40.0  37.9  38.5   Platelets 150 - 400 K/uL 186   281  338    Lipid Panel     Component Value Date/Time   TRIG 91 07/07/2020 1058   HEMOGLOBIN A1C Lab Results  Component Value Date   HGBA1C 5.5 09/24/2017   MPG 111.15 09/24/2017   TSH No results for input(s): "TSH" in the last 8760 hours.  BNP (last 3 results) Recent Labs    11/14/21 1900  BNP 305.2*   ProBNP (last 3 results) Recent Labs    04/06/22 1309 04/24/22 1516  PROBNP 1,126* 832*    External Labs:  Labs 02/20/2022:  proBNP 943.  Sodium 141, potassium 3.9, BUN 25, creatinine 1.47, EGFR 36.  mL.  A1c 5.2%.  Total cholesterol 159, triglycerides 69, HDL 50, LDL 95.  Allergies  Allergen Reactions   Amlodipine Swelling    Intolerance, LE edema    Zestoretic [Lisinopril-Hydrochlorothiazide] Itching and Rash     Current Outpatient Medications:    empagliflozin (JARDIANCE) 10 MG TABS tablet, Take 1 tablet (10 mg total) by mouth daily., Disp: 30 tablet, Rfl: 3   hydrALAZINE (APRESOLINE) 100 MG tablet, Take 100 mg by mouth 3 (three) times daily., Disp: , Rfl:    isosorbide dinitrate (ISORDIL) 30 MG tablet, TAKE 1 TABLET(30 MG) BY MOUTH THREE TIMES DAILY, Disp: 90 tablet, Rfl: 2   pindolol (VISKEN) 5 MG tablet, TAKE 1 TABLET(5 MG) BY MOUTH TWICE DAILY, Disp: 180 tablet, Rfl: 1   Potassium Chloride ER 20 MEQ TBCR, Take 1 tablet by mouth daily., Disp: , Rfl:    spironolactone (ALDACTONE) 25 MG tablet, Take 0.5 tablets (12.5 mg total) by mouth every morning., Disp: 30 tablet, Rfl: 2   torsemide (DEMADEX) 20 MG tablet, Take 2 tablets (40 mg total) by mouth 2 (two) times daily at 10 am and 4 pm. Two tablets BID for 3 days only starting 09/19/17, Disp: 180 tablet, Rfl: 3   XARELTO 15 MG TABS tablet, TAKE 1 TABLET BY MOUTH EVERY EVENING AFTER DINNER, Disp: 90 tablet, Rfl: 0   CARDIAC STUDIES:   Renal Angio  [07/31/2014]: Left mild disease. Right mild to moderate disease. Unable to perform pressure pull back to evaluate significance. Does not appear high grade.  Lexiscan myoview stress test 10/12/2016: 1. The resting electrocardiogram demonstrated atrial fibrillation, normal resting conduction and nonspecific T changes. Stress EKG is non-diagnostic for ischemia as it a pharmacologic stress using Lexiscan. Rare PVC. Stress symptoms included dyspnea. 2. Left ventricular cavity is noted to be enlarged on the rest and stress studies with LV end diastolic volume of 782NF. SPECT images demonstrate homogeneous tracer distribution throughout the myocardium. No ischemia or scar. The left ventricular ejection fraction was calculated to be 41%. The ejection fraction may not be accurate and may be related to difficulty in getting EKG due to atrial fibrillation. This is a low risk study.  Echocardiogram   Hospital 09/25/2017: Low normal LVEF at 62-13%, grade 2 diastolic dysfunction. Severely dilated left atrium and right atrium, mild RV dilatation with mild RVH. Moderate pulmonary hypertension, PA pressure 49 mmHg.  LE segmental pressure 09/27/2017: Right: Resting right ankle-brachial index is within normal range. No evidence of significant right lower extremity arterial disease. The right toe-brachial index is normal. RT great toe pressure = 106 mmHg. Left: Resting left ankle-brachial index is within normal range. No evidence of significant left lower extremity arterial disease. The left toe-brachial index is normal. LT Great toe pressure = 107 mmHg. Left second toe pressure = 71 mmHg.  EKG:  EKG 04/28/2022: Atrial fibrillation with controlled ventricular response at rate of 74 bpm, normal axis, anteroseptal infarct old.  Normal QT interval.  Low-voltage complexes, consider pulmonary disease pattern.  No evidence of ischemia.  Compared to 03/05/2022, no change. Compared to compared to 03/29/2019, no significant change.  Assessment:       ICD-10-CM   1. Acute on chronic diastolic heart failure (HCC)  I50.33 EKG 12-Lead    2. Permanent atrial fibrillation (HCC)  I48.21      3. Primary hypertension  I10      CHA2DS2-VASc Score is 5.  Yearly risk of stroke: 7% (F, Age, HTN, CHF).  Score of 1=0.6; 2=2.2; 3=3.2; 4=4.8; 5=7.2; 6=9.8; 7=>9.8) -(CHF; HTN; vasc disease DM,  Female = 1; Age <65 =0; 65-74 = 1,  >75 =2; stroke/embolism= 2).    There are no discontinued medications.   No orders of the defined types were placed in this encounter.    Recommendations:   Shannon [name] s a 88 y.o. with permanent A. Fib S/P cardioversion 10/2016, difficult to control hypertension (No RAS by angio in 2016), stage IIIa-b chronic kidney disease, hyperlipidemia, obesity, frequent PVCs by electrocardiogram and history of gout.    This is a 6-week office visit for heart failure.  Since being on medical therapy, leg edema is improved, she has lost additional 4 to 6 pounds in weight since last office visit.  She has no specific complaints today and states that she is back to her baseline.  1. Acute on chronic diastolic heart failure ([place]) Patient still has 2-3+ bilateral leg edema but she has not had any further open wounds or discharge or leakiness from her legs.  Symptoms of dyspnea has also improved.  Weight is going down.  NT proBNP has improved.  Renal function has remained stable.  She is tolerating all her medications well but she is not sure of the medication she is taking as she did not bring the list or the medications with her today. I have again reiterated the importance of being strict with diet and salt restriction and fluid restriction.  2. Permanent atrial fibrillation [facility]) Patient is in permanent atrial fibrillation and remains so, doubt that  she will convert to sinus rhythm.  Would continue rate control only.  3. Primary hypertension Blood pressure is now much improved with the present medical regimen.  Renal function has remained stable as well.  Hence advised her to continue the same, would like to see her back in 4 weeks and advised him to bring all her  medications to the appointments.  Labs reviewed.  Daughter present and all questions answered.   Adrian Prows, MD, [facility] 04/28/2022, 11:58 AM Office: [phone] Fax: [phone] Pager: [phone]

## 2022-04-30 DIAGNOSIS — D6869 Other thrombophilia: Secondary | ICD-10-CM | POA: Diagnosis not present

## 2022-04-30 DIAGNOSIS — I25118 Atherosclerotic heart disease of native coronary artery with other forms of angina pectoris: Secondary | ICD-10-CM | POA: Diagnosis not present

## 2022-04-30 DIAGNOSIS — I130 Hypertensive heart and chronic kidney disease with heart failure and stage 1 through stage 4 chronic kidney disease, or unspecified chronic kidney disease: Secondary | ICD-10-CM | POA: Diagnosis not present

## 2022-04-30 DIAGNOSIS — R7309 Other abnormal glucose: Secondary | ICD-10-CM | POA: Diagnosis not present

## 2022-04-30 DIAGNOSIS — I701 Atherosclerosis of renal artery: Secondary | ICD-10-CM | POA: Diagnosis not present

## 2022-04-30 DIAGNOSIS — N1832 Chronic kidney disease, stage 3b: Secondary | ICD-10-CM | POA: Diagnosis not present

## 2022-04-30 DIAGNOSIS — I480 Paroxysmal atrial fibrillation: Secondary | ICD-10-CM | POA: Diagnosis not present

## 2022-04-30 DIAGNOSIS — I10 Essential (primary) hypertension: Secondary | ICD-10-CM | POA: Diagnosis not present

## 2022-04-30 DIAGNOSIS — E782 Mixed hyperlipidemia: Secondary | ICD-10-CM | POA: Diagnosis not present

## 2022-04-30 DIAGNOSIS — I5032 Chronic diastolic (congestive) heart failure: Secondary | ICD-10-CM | POA: Diagnosis not present

## 2022-05-20 DIAGNOSIS — I5032 Chronic diastolic (congestive) heart failure: Secondary | ICD-10-CM | POA: Diagnosis not present

## 2022-05-26 ENCOUNTER — Telehealth: Payer: Self-pay

## 2022-05-26 ENCOUNTER — Ambulatory Visit: Payer: Medicare Other | Admitting: Cardiology

## 2022-05-26 ENCOUNTER — Encounter: Payer: Self-pay | Admitting: Cardiology

## 2022-05-26 VITALS — BP 151/79 | HR 71 | Resp 16 | Ht 67.0 in | Wt 209.2 lb

## 2022-05-26 DIAGNOSIS — I5033 Acute on chronic diastolic (congestive) heart failure: Secondary | ICD-10-CM

## 2022-05-26 DIAGNOSIS — I4821 Permanent atrial fibrillation: Secondary | ICD-10-CM

## 2022-05-26 DIAGNOSIS — I10 Essential (primary) hypertension: Secondary | ICD-10-CM

## 2022-05-26 MED ORDER — TORSEMIDE 20 MG PO TABS: 40.0000 mg | ORAL_TABLET | Freq: Two times a day (BID) | ORAL | 3 refills | Status: DC

## 2022-05-26 MED ORDER — TORSEMIDE 20 MG PO TABS
40.0000 mg | ORAL_TABLET | Freq: Two times a day (BID) | ORAL | 3 refills | Status: DC
Start: 2022-05-26 — End: 2022-06-30

## 2022-05-26 NOTE — Telephone Encounter (Signed)
Patient's home weight fluctuates around her baseline weight. Patient only has 4 days of weight readings this month.   Average Weight Level 207.4 lbs Lowest Weight Level 205.6 lbs Highest Weight Level 209.2 lbs  05/24/2022 'Sunday at 12:40 PM 208.4      05/21/2022 Thursday at 08:41 AM 205.6      05/21/2022 Thursday at 05:58 AM 206      05/19/2022 Tuesday at 02:45 PM 209.2      05/19/2022 Tuesday at 02:45 PM 209.2      12'$ /06/2021 Friday at 08:30 AM 206

## 2022-05-26 NOTE — Progress Notes (Signed)
Subjective:  Primary Physician:  Shannon Seashore, MD  Patient ID: Shannon [name], [name]    DOB: 1930/08/29, 88 y.o.   MRN: [id number]  Chief Complaint  Patient presents with   Congestive Heart Failure   Hypertension   Atrial Fibrillation   Follow-up   HPI: Shannon [name]  is a 88 y.o. [name] with permanent A. Fib [name]/P cardioversion 10/2016, difficult to control hypertension (No RAS by angio in 2016), stage IIIa-b chronic kidney disease, hyperlipidemia, obesity, frequent PVCs by electrocardiogram and history of gout.    This is a 4-week office visit for heart failure.  She has no specific complaints today although she has chronic dyspnea.  She is accompanied by her daughter.  Leg swelling continues to persist.  Past Medical History:  Diagnosis Date   Acute exacerbation of CHF (congestive heart failure) ([place]) 09/24/2017   Arthritis    "all over"   Atrial fibrillation (HCC)    Chronic diastolic CHF (congestive heart failure) ([place]) 09/24/2017   Chronic kidney disease    Dyspnea    Gout    Hypercholesteremia    Hypertension    Mitral regurgitation     Past Surgical History:  Procedure Laterality Date   BREAST BIOPSY Left 1984   CARDIOVERSION N/A 10/27/2016   Procedure: CARDIOVERSION;  Surgeon: Shannon Prows, MD;  Location: [place];  Service: Cardiovascular;  Laterality: N/A;   CATARACT EXTRACTION, BILATERAL Bilateral 2000'[name]   RENAL ANGIOGRAM Bilateral 07/31/2014   Procedure: RENAL ANGIOGRAM;  Surgeon: Shannon Page, MD;  Location[name] CATH LAB;  Service: Cardiovascular;  Laterality: Bilateral;   Social History   Tobacco Use   Smoking status: Never   Smokeless tobacco: Former    Types: Snuff   Tobacco comments:    "quit using snuff in the 1960'[name]"  Substance Use Topics   Alcohol use: No  Marital Status: Married    No orders of the defined types were placed in this encounter.  Review of Systems  Cardiovascular:  Positive for dyspnea on exertion and leg  swelling. Negative for chest pain.   Objective:      05/26/2022   11:28 AM 04/28/2022   11:14 AM 04/28/2022   11:04 AM  Vitals with BMI  Height _0   _1   Weight 209 lbs 3 oz  207 lbs 6 oz  BMI 59.93  57.01  Systolic 779 390 300  Diastolic 79 80 78  Pulse 71 68 65    Physical Exam Neck:     Vascular: JVD present. No carotid bruit.  Cardiovascular:     Rate and Rhythm: Normal rate. Rhythm irregular.     Pulses: Normal pulses and intact distal pulses.     Heart sounds: No murmur heard. Pulmonary:     Effort: Pulmonary effort is normal.     Breath sounds: Normal breath sounds.  Abdominal:     General: Bowel sounds are normal.     Palpations: Abdomen is soft.  Musculoskeletal:     Right lower leg: Edema (3+ pitting,) present.     Left lower leg: Edema (3+ pitting below knee) present.  Skin:    Capillary Refill: Capillary refill takes less than 2 seconds.    Radiology:  CXR 11/14/21: Chronic cardiomegaly. Unchanged mediastinal contours. Dense aortic atherosclerosis. Moderate bilateral pleural effusions with associated bibasilar opacities. Vascular congestion with suggestion of mild peripheral septal thickening/pulmonary edema. Chronic biapical pleuroparenchymal scarring. Stable osseous structures.  Laboratory Examination:  Lab Results  Component Value Date  NA 140 04/24/2022   K 4.6 04/24/2022   CO2 29 04/24/2022   GLUCOSE 81 04/24/2022   BUN 33 (H) 04/24/2022   CREATININE 1.42 (H) 04/24/2022   CALCIUM 8.9 04/24/2022   EGFR 35 (L) 04/24/2022   GFRNONAA 42 (L) 11/14/2021       Latest Ref Rng & Units 04/24/2022    3:16 PM 04/06/2022    1:09 PM 11/14/2021    7:00 PM  CMP  Glucose 70 - 99 mg/dL 81  96  103   BUN 8 - 27 mg/dL 33  23  21   Creatinine 0.57 - 1.00 mg/dL 1.42  1.42  1.24   Sodium 134 - 144 mmol/L 140  141  140   Potassium 3.5 - 5.2 mmol/L 4.6  3.7  4.3   Chloride 96 - 106 mmol/L 100  100  104   CO2 20 - 29 mmol/L _0 Calcium 8.7 -  10.3 mg/dL 8.9  9.2  9.5       Latest Ref Rng & Units 11/14/2021    7:00 PM 07/11/2020   12:53 AM 07/10/2020   12:27 AM  CBC  WBC 4.0 - 10.5 K/uL 6.5  9.1  9.5   Hemoglobin 12.0 - 15.0 g/dL 12.7  12.3  12.1   Hematocrit 36.0 - 46.0 % 40.0  37.9  38.5   Platelets 150 - 400 K/uL 186  281  338    Lipid Panel     Component Value Date/Time   TRIG 91 07/07/2020 1058   HEMOGLOBIN A1C Lab Results  Component Value Date   HGBA1C 5.5 09/24/2017   MPG 111.15 09/24/2017   TSH No results for input([name]): "TSH" in the last 8760 hours.  BNP (last 3 results) Recent Labs    11/14/21 1900  BNP 305.2*   ProBNP (last 3 results) Recent Labs    04/06/22 1309 04/24/22 1516  PROBNP 1,126* 832*    External Labs:  Labs 02/20/2022:  proBNP 943.  Sodium 141, potassium 3.9, BUN 25, creatinine 1.47, EGFR 36.  mL.  A1c 5.2%.  Total cholesterol 159, triglycerides 69, HDL 50, LDL 95.  Allergies  Allergen Reactions   Amlodipine Swelling    Intolerance, LE edema    Zestoretic [Lisinopril-Hydrochlorothiazide] Itching and Rash     Current Outpatient Medications:    empagliflozin (JARDIANCE) 10 MG TABS tablet, Take 1 tablet (10 mg total) by mouth daily., Disp: 30 tablet, Rfl: 3   hydrALAZINE (APRESOLINE) 100 MG tablet, Take 100 mg by mouth 3 (three) times daily., Disp: , Rfl:    isosorbide dinitrate (ISORDIL) 30 MG tablet, TAKE 1 TABLET(30 MG) BY MOUTH THREE TIMES DAILY, Disp: 90 tablet, Rfl: 2   pindolol (VISKEN) 5 MG tablet, TAKE 1 TABLET(5 MG) BY MOUTH TWICE DAILY, Disp: 180 tablet, Rfl: 1   Potassium Chloride ER 20 MEQ TBCR, Take 1 tablet by mouth daily., Disp: , Rfl:    spironolactone (ALDACTONE) 25 MG tablet, Take 0.5 tablets (12.5 mg total) by mouth every morning., Disp: 30 tablet, Rfl: 2   XARELTO 15 MG TABS tablet, TAKE 1 TABLET BY MOUTH EVERY EVENING AFTER DINNER, Disp: 90 tablet, Rfl: 0   torsemide (DEMADEX) 20 MG tablet, Take 2 tablets (40 mg total) by mouth 2 (two) times daily at 10  am and 4 pm. Two tablets BID for 5 days only starting 05/26/22, Disp: 180 tablet, Rfl: 3   CARDIAC STUDIES:   Renal Angio  [07/31/2014]: Left mild  disease. Right mild to moderate disease. Unable to perform pressure pull back to evaluate significance. Does not appear high grade.   Lexiscan myoview stress test 10/12/2016: 1. The resting electrocardiogram demonstrated atrial fibrillation, normal resting conduction and nonspecific T changes. Stress EKG is non-diagnostic for ischemia as it a pharmacologic stress using Lexiscan. Rare PVC. Stress symptoms included dyspnea. 2. Left ventricular cavity is noted to be enlarged on the rest and stress studies with LV end diastolic volume of 300PQ. SPECT images demonstrate homogeneous tracer distribution throughout the myocardium. No ischemia or scar. The left ventricular ejection fraction was calculated to be 41%. The ejection fraction may not be accurate and may be related to difficulty in getting EKG due to atrial fibrillation. This is a low risk study.  Echocardiogram   Hospital 09/25/2017: Low normal LVEF at 33-00%, grade 2 diastolic dysfunction. Severely dilated left atrium and right atrium, mild RV dilatation with mild RVH. Moderate pulmonary hypertension, PA pressure 49 mmHg.  LE segmental pressure 09/27/2017: Right: Resting right ankle-brachial index is within normal range. No evidence of significant right lower extremity arterial disease. The right toe-brachial index is normal. RT great toe pressure = 106 mmHg. Left: Resting left ankle-brachial index is within normal range. No evidence of significant left lower extremity arterial disease. The left toe-brachial index is normal. LT Great toe pressure = 107 mmHg. Left second toe pressure = 71 mmHg.  EKG:  EKG 04/28/2022: Atrial fibrillation with controlled ventricular response at rate of 74 bpm, normal axis, anteroseptal infarct old.  Normal QT interval.  Low-voltage complexes, consider pulmonary disease  pattern.  No evidence of ischemia.  Compared to 03/05/2022, no change. Compared to compared to 03/29/2019, no significant change.  Assessment:       ICD-10-CM   1. Acute on chronic diastolic heart failure (HCC)  I50.33 torsemide (DEMADEX) 20 MG tablet    DISCONTINUED: torsemide (DEMADEX) 20 MG tablet    2. Permanent atrial fibrillation (HCC)  I48.21     3. Primary hypertension  I10      CHA2DS2-VASc Score is 5.  Yearly risk of stroke: 7% (F, Age, HTN, CHF).  Score of 1=0.6; 2=2.2; 3=3.2; 4=4.8; 5=7.2; 6=9.8; 7=>9.8) -(CHF; HTN; vasc disease DM,  [name] = 1; Age <65 =0; 65-74 = 1,  >75 =2; stroke/embolism= 2).    Recommendations:   Shannon [name] a 88 y.o. with permanent A. Fib [name]/P cardioversion 10/2016, difficult to control hypertension (No RAS by angio in 2016), stage IIIa-b chronic kidney disease, hyperlipidemia, obesity, frequent PVCs by electrocardiogram and history of gout.    This is a 4-week office visit for heart failure.  .  1. Acute on chronic diastolic heart failure ([place]) Patient still has 3+ bilateral leg edema but she has not had any further open wounds or discharge or leakiness from her legs.  However she clearly has worsening leg edema of 3+ and also JVD and weight gain.    I have again reiterated the importance of being strict with diet and salt restriction and fluid restriction.  Her daughter is present at bedside, advised her that torsemide is already on a maximum dose, unless she makes lifestyle changes with regard to fluid restriction and also salt restriction, she will end up being in the hospital.  I do not want to use Zaroxolyn/metolazone.  She will continue with 40 mg of torsemide twice daily.  Enrolled in RPM:  05/24/2022 _0 8.4  05/21/2022 Thursday at 08:41 AM     205.6        05/21/2022 Thursday at 05:58 AM     206           05/19/2022 Tuesday at 02:45 PM      209.2        05/19/2022 Tuesday at 02:45 PM      209.2         05/15/2022 Friday at 08:30 AM          206  2. Permanent atrial fibrillation [facility]) Patient is in permanent atrial fibrillation and remains so, doubt that she will convert to sinus rhythm.  Would continue rate control only.  3. Primary hypertension On her last office visit just 4 weeks ago when her weight was down and her leg edema improved, blood pressure was under excellent control.  Blood pressure has gone up again.  I did not make any changes to her antihypertensive medications for now, I would like to see her back in 1 week for close monitoring of both hypertension and acute on chronic diastolic heart failure.  I suspect she may end up getting admitted back to the hospital.  I have strictly warned her again to be careful with her diet, salt intake and fluid intake.   Shannon Prows, MD, [facility] 05/26/2022, 7:12 PM Office: [phone] Fax: [phone] Pager: [phone]

## 2022-06-02 ENCOUNTER — Ambulatory Visit: Payer: Medicare Other | Admitting: Cardiology

## 2022-06-02 ENCOUNTER — Telehealth: Payer: Self-pay

## 2022-06-02 ENCOUNTER — Encounter: Payer: Self-pay | Admitting: Cardiology

## 2022-06-02 VITALS — BP 136/62 | HR 69 | Ht 67.0 in | Wt 204.6 lb

## 2022-06-02 DIAGNOSIS — I5033 Acute on chronic diastolic (congestive) heart failure: Secondary | ICD-10-CM | POA: Diagnosis not present

## 2022-06-02 DIAGNOSIS — I10 Essential (primary) hypertension: Secondary | ICD-10-CM

## 2022-06-02 DIAGNOSIS — I4821 Permanent atrial fibrillation: Secondary | ICD-10-CM

## 2022-06-02 NOTE — Progress Notes (Signed)
Subjective:  Primary Physician:  [name], MD  Patient ID: Shannon [name], female    DOB: [birth year], 88 y.o.   MRN: [id number]  Chief Complaint  Patient presents with  . Acute on chronic diastolic heart failure  . Follow-up   HPI: Shannon [name]  is a 88 y.o. female with permanent A. Fib S/P cardioversion 10/2016, difficult to control hypertension (No RAS by angio in 2016), stage IIIa-b chronic kidney disease, hyperlipidemia, obesity, frequent PVCs by electrocardiogram and history of gout.    1 week follow-up.  She has lost weight and has been strict with her diet.  She has no specific complaints today although she has chronic dyspnea.  She is accompanied by her daughter.  Neck swelling is improved significantly. Past Medical History:  Diagnosis Date  . Acute exacerbation of CHF (congestive heart failure) ([place]) 09/24/2017  . Arthritis    "all over"  . Atrial fibrillation ([place])   . Chronic diastolic CHF (congestive heart failure) ([place]) 09/24/2017  . Chronic kidney disease   . Dyspnea   . Gout   . Hypercholesteremia   . Hypertension   . Mitral regurgitation     Past Surgical History:  Procedure Laterality Date  . BREAST BIOPSY Left 1984  . CARDIOVERSION N/A 10/27/2016   Procedure: CARDIOVERSION;  Surgeon: Adrian Prows, MD;  Location: [facility];  Service: Cardiovascular;  Laterality: N/A;  . CATARACT EXTRACTION, BILATERAL Bilateral 2000's  . RENAL ANGIOGRAM Bilateral 07/31/2014   Procedure: RENAL ANGIOGRAM;  Surgeon: Laverda Page, MD;  Location: [facility] CATH LAB;  Service: Cardiovascular;  Laterality: Bilateral;   Social History   Tobacco Use  . Smoking status: Never  . Smokeless tobacco: Former    Types: Snuff  . Tobacco comments:    "quit using snuff in the 1960's"  Substance Use Topics  . Alcohol use: No  Marital Status: Married    No orders of the defined types were placed in this encounter.  Review of Systems  Cardiovascular:  Positive for  dyspnea on exertion and leg swelling. Negative for chest pain.   Objective:      06/02/2022   11:26 AM 05/26/2022   11:28 AM 04/28/2022   11:14 AM  Vitals with BMI  Height 5' 7" 5' 7"   Weight 204 lbs 10 oz 209 lbs 3 oz   BMI 35.57 32.20   Systolic 254 270 623  Diastolic 62 79 80  Pulse 69 71 68    Physical Exam Neck:     Vascular: JVD present. No carotid bruit.  Cardiovascular:     Rate and Rhythm: Normal rate. Rhythm irregular.     Pulses: Normal pulses and intact distal pulses.     Heart sounds: No murmur heard. Pulmonary:     Effort: Pulmonary effort is normal.     Breath sounds: Normal breath sounds.  Abdominal:     General: Bowel sounds are normal.     Palpations: Abdomen is soft.  Musculoskeletal:     Right lower leg: Edema (2+ pitting,) present.     Left lower leg: Edema (2+ pitting below knee) present.  Skin:    Capillary Refill: Capillary refill takes less than 2 seconds.   Radiology:  CXR 11/14/21: Chronic cardiomegaly. Unchanged mediastinal contours. Dense aortic atherosclerosis. Moderate bilateral pleural effusions with associated bibasilar opacities. Vascular congestion with suggestion of mild peripheral septal thickening/pulmonary edema. Chronic biapical pleuroparenchymal scarring. Stable osseous structures.  Laboratory Examination:  Lab Results  Component Value Date  NA 140 04/24/2022   K 4.6 04/24/2022   CO2 29 04/24/2022   GLUCOSE 81 04/24/2022   BUN 33 (H) 04/24/2022   CREATININE 1.42 (H) 04/24/2022   CALCIUM 8.9 04/24/2022   EGFR 35 (L) 04/24/2022   GFRNONAA 42 (L) 11/14/2021       Latest Ref Rng & Units 04/24/2022    3:16 PM 04/06/2022    1:09 PM 11/14/2021    7:00 PM  CMP  Glucose 70 - 99 mg/dL 81  96  103   BUN 8 - 27 mg/dL 33  23  21   Creatinine 0.57 - 1.00 mg/dL 1.42  1.42  1.24   Sodium 134 - 144 mmol/L 140  141  140   Potassium 3.5 - 5.2 mmol/L 4.6  3.7  4.3   Chloride 96 - 106 mmol/L 100  100  104   CO2 20 - 29 mmol/L _0 Calcium 8.7 - 10.3 mg/dL 8.9  9.2  9.5       Latest Ref Rng & Units 11/14/2021    7:00 PM 07/11/2020   12:53 AM 07/10/2020   12:27 AM  CBC  WBC 4.0 - 10.5 K/uL 6.5  9.1  9.5   Hemoglobin 12.0 - 15.0 g/dL 12.7  12.3  12.1   Hematocrit 36.0 - 46.0 % 40.0  37.9  38.5   Platelets 150 - 400 K/uL 186  281  338    Lipid Panel     Component Value Date/Time   TRIG 91 07/07/2020 1058   HEMOGLOBIN A1C Lab Results  Component Value Date   HGBA1C 5.5 09/24/2017   MPG 111.15 09/24/2017   TSH No results for input(s): "TSH" in the last 8760 hours.  BNP (last 3 results) Recent Labs    11/14/21 1900  BNP 305.2*   ProBNP (last 3 results) Recent Labs    04/06/22 1309 04/24/22 1516  PROBNP 1,126* 832*    External Labs:  Labs 02/20/2022:  proBNP 943.  Sodium 141, potassium 3.9, BUN 25, creatinine 1.47, EGFR 36.  mL.  A1c 5.2%.  Total cholesterol 159, triglycerides 69, HDL 50, LDL 95.  Allergies  Allergen Reactions  . Amlodipine Swelling    Intolerance, LE edema   . Zestoretic [Lisinopril-Hydrochlorothiazide] Itching and Rash     Current Outpatient Medications:  .  empagliflozin (JARDIANCE) 10 MG TABS tablet, Take 1 tablet (10 mg total) by mouth daily., Disp: 30 tablet, Rfl: 3 .  hydrALAZINE (APRESOLINE) 100 MG tablet, Take 100 mg by mouth 3 (three) times daily., Disp: , Rfl:  .  isosorbide dinitrate (ISORDIL) 30 MG tablet, TAKE 1 TABLET(30 MG) BY MOUTH THREE TIMES DAILY, Disp: 90 tablet, Rfl: 2 .  pindolol (VISKEN) 5 MG tablet, TAKE 1 TABLET(5 MG) BY MOUTH TWICE DAILY, Disp: 180 tablet, Rfl: 1 .  Potassium Chloride ER 20 MEQ TBCR, Take 1 tablet by mouth daily., Disp: , Rfl:  .  spironolactone (ALDACTONE) 25 MG tablet, Take 0.5 tablets (12.5 mg total) by mouth every morning., Disp: 30 tablet, Rfl: 2 .  torsemide (DEMADEX) 20 MG tablet, Take 2 tablets (40 mg total) by mouth 2 (two) times daily at 10 am and 4 pm. Two tablets BID for 5 days only starting 05/26/22, Disp:  180 tablet, Rfl: 3 .  XARELTO 15 MG TABS tablet, TAKE 1 TABLET BY MOUTH EVERY EVENING AFTER DINNER, Disp: 90 tablet, Rfl: 0   CARDIAC STUDIES:   Renal Angio  [07/31/2014]: Left mild  disease. Right mild to moderate disease. Unable to perform pressure pull back to evaluate significance. Does not appear high grade.   Lexiscan myoview stress test 10/12/2016: 1. The resting electrocardiogram demonstrated atrial fibrillation, normal resting conduction and nonspecific T changes. Stress EKG is non-diagnostic for ischemia as it a pharmacologic stress using Lexiscan. Rare PVC. Stress symptoms included dyspnea. 2. Left ventricular cavity is noted to be enlarged on the rest and stress studies with LV end diastolic volume of 160FU. SPECT images demonstrate homogeneous tracer distribution throughout the myocardium. No ischemia or scar. The left ventricular ejection fraction was calculated to be 41%. The ejection fraction may not be accurate and may be related to difficulty in getting EKG due to atrial fibrillation. This is a low risk study.  Echocardiogram   Hospital 09/25/2017: Low normal LVEF at 93-23%, grade 2 diastolic dysfunction. Severely dilated left atrium and right atrium, mild RV dilatation with mild RVH. Moderate pulmonary hypertension, PA pressure 49 mmHg.  LE segmental pressure 09/27/2017: Right: Resting right ankle-brachial index is within normal range. No evidence of significant right lower extremity arterial disease. The right toe-brachial index is normal. RT great toe pressure = 106 mmHg. Left: Resting left ankle-brachial index is within normal range. No evidence of significant left lower extremity arterial disease. The left toe-brachial index is normal. LT Great toe pressure = 107 mmHg. Left second toe pressure = 71 mmHg.  EKG:  EKG 04/28/2022: Atrial fibrillation with controlled ventricular response at rate of 74 bpm, normal axis, anteroseptal infarct old.  Normal QT interval.  Low-voltage  complexes, consider pulmonary disease pattern.  No evidence of ischemia.  Compared to 03/05/2022, no change. Compared to compared to 03/29/2019, no significant change.  Assessment:       ICD-10-CM   1. Acute on chronic diastolic heart failure (HCC)  I50.33     2. Permanent atrial fibrillation (HCC)  I48.21     3. Primary hypertension  I10      CHA2DS2-VASc Score is 5.  Yearly risk of stroke: 7% (F, Age, HTN, CHF).  Score of 1=0.6; 2=2.2; 3=3.2; 4=4.8; 5=7.2; 6=9.8; 7=>9.8) -(CHF; HTN; vasc disease DM,  Female = 1; Age <65 =0; 65-74 = 1,  >75 =2; stroke/embolism= 2).    Recommendations:   Shannon [name] s a 88 y.o. with permanent A. Fib S/P cardioversion 10/2016, difficult to control hypertension (No RAS by angio in 2016), stage IIIa-b chronic kidney disease, hyperlipidemia, obesity, frequent PVCs by electrocardiogram and history of gout.    This is a 1-week office visit for heart failure.  .  1. Acute on chronic diastolic heart failure ([place]) Patient has been strict with her diet, she has lost about 4 to 6 pounds in weight, leg edema is significantly improved.  Advised her not to be sitting in a recliner, and also to use Ace wrap as soon as she wakes up in the morning.  Continue torsemide twice daily for now.  2. Permanent atrial fibrillation (HCC) Atrial fibrillation remains rate controlled.  No changes in the medications.  Continue anticoagulation.  3. Primary hypertension Blood pressure is under excellent control.  With weight loss blood pressure control is also improved.  I would like to see her back in 4 to 6 weeks for close monitoring.  She is enrolled in RPM.   Enrolled in RPM:  1Overall patient's weight has decreased slightly from last office visit.    Weight from 12/12 - 12/19 Average Weight Level 202.33 lbs Lowest Weight Level 200.8 lbs  Highest Weight Level 203.8 lbs   Weight from 12/1-12/12 visit Average Weight Level 207.4 lbs Lowest Weight Level 205.6  lbs Highest Weight Level 209.2 lbs   06/02/2022 Tuesday at 08:31 AM      200.8        06/01/2022 Monday at 09:16 AM       200.8        06/01/2022 Monday at 06:33 AM       201.8        05/31/2022 _0 5.6    Adrian Prows, MD, [facility] 06/02/2022, 11:37 AM Office: [phone] Fax: [phone] Pager: [phone]

## 2022-06-02 NOTE — Telephone Encounter (Signed)
Overall patient's weight has decreased slightly from last office visit.   Weight from 12/12 - 12/19 Average Weight Level 202.33 lbs Lowest Weight Level 200.8 lbs Highest Weight Level 203.8 lbs  Weight from 12/1-12/12 visit Average Weight Level 207.4 lbs Lowest Weight Level 205.6 lbs Highest Weight Level 209.2 lbs  06/02/2022 Tuesday at 08:31 AM 200.8      06/01/2022 Monday at 09:16 AM 200.8      06/01/2022 Monday at 06:33 AM 201.8      05/31/2022 'Sunday at 09:16 AM 203.4      05/31/2022 Sunday at 09:14 AM 203.8      05/30/2022 Saturday at 09:07 AM 202.2      05/29/2022 Friday at 08:53 AM 201.4      05/28/2022 Thursday at 08:51 AM 203.8      05/24/2022 Sunday at 12:40 PM 208.4      12'$ /12/2021 Thursday at 08:41 AM 205.6

## 2022-06-14 DIAGNOSIS — I5032 Chronic diastolic (congestive) heart failure: Secondary | ICD-10-CM | POA: Diagnosis not present

## 2022-06-14 DIAGNOSIS — E1122 Type 2 diabetes mellitus with diabetic chronic kidney disease: Secondary | ICD-10-CM | POA: Diagnosis not present

## 2022-06-14 DIAGNOSIS — N184 Chronic kidney disease, stage 4 (severe): Secondary | ICD-10-CM | POA: Diagnosis not present

## 2022-06-14 DIAGNOSIS — I130 Hypertensive heart and chronic kidney disease with heart failure and stage 1 through stage 4 chronic kidney disease, or unspecified chronic kidney disease: Secondary | ICD-10-CM | POA: Diagnosis not present

## 2022-06-20 DIAGNOSIS — I5032 Chronic diastolic (congestive) heart failure: Secondary | ICD-10-CM | POA: Diagnosis not present

## 2022-06-30 ENCOUNTER — Encounter: Payer: Self-pay | Admitting: Cardiology

## 2022-06-30 ENCOUNTER — Telehealth: Payer: Self-pay

## 2022-06-30 ENCOUNTER — Ambulatory Visit: Payer: Medicare Other | Admitting: Cardiology

## 2022-06-30 VITALS — BP 137/71 | HR 73 | Resp 16 | Ht 67.0 in | Wt 204.0 lb

## 2022-06-30 DIAGNOSIS — N1832 Chronic kidney disease, stage 3b: Secondary | ICD-10-CM

## 2022-06-30 DIAGNOSIS — I4821 Permanent atrial fibrillation: Secondary | ICD-10-CM

## 2022-06-30 DIAGNOSIS — I5032 Chronic diastolic (congestive) heart failure: Secondary | ICD-10-CM | POA: Diagnosis not present

## 2022-06-30 DIAGNOSIS — I10 Essential (primary) hypertension: Secondary | ICD-10-CM | POA: Diagnosis not present

## 2022-06-30 MED ORDER — TORSEMIDE 20 MG PO TABS: 20.0000 mg | ORAL_TABLET | Freq: Two times a day (BID) | ORAL | 3 refills | Status: DC

## 2022-06-30 NOTE — Progress Notes (Signed)
Subjective:  Primary Physician:  [name], MD  Patient ID: Shannon [name], female    DOB: 07-31-1930, 89 y.o.   MRN: [id number]  Chief Complaint  Patient presents with   Acute on chronic diastolic heart failure   Paroxysmal atrial fibrillation   Follow-up    4 weeks   HPI: Shannon [name]  is a 89 y.o. female with permanent A. Fib, hypertension (No RAS by angio in 2016), stage IIIa-b chronic kidney disease, hyperlipidemia, obesity, frequent PVCs by electrocardiogram and history of gout.    This is a 2-week office visit for heart failure. She has lost weight and has been strict with her diet.  She has no specific complaints today although she has chronic dyspnea.  She is accompanied by her daughter.  States that her dyspnea has improved significantly.  She has not had any further leg edema.   Past Medical History:  Diagnosis Date   Acute exacerbation of CHF (congestive heart failure) ([place]) 09/24/2017   Arthritis    "all over"   Atrial fibrillation (HCC)    Chronic diastolic CHF (congestive heart failure) ([place]) 09/24/2017   Chronic kidney disease    Dyspnea    Gout    Hypercholesteremia    Hypertension    Mitral regurgitation     Past Surgical History:  Procedure Laterality Date   BREAST BIOPSY Left 1984   CARDIOVERSION N/A 10/27/2016   Procedure: CARDIOVERSION;  Surgeon: Adrian Prows, MD;  Location: [facility];  Service: Cardiovascular;  Laterality: N/A;   CATARACT EXTRACTION, BILATERAL Bilateral 2000'[name]   RENAL ANGIOGRAM Bilateral 07/31/2014   Procedure: RENAL ANGIOGRAM;  Surgeon: Laverda Page, MD;  Location: [facility] CATH LAB;  Service: Cardiovascular;  Laterality: Bilateral;   Social History   Tobacco Use   Smoking status: Never   Smokeless tobacco: Former    Types: Snuff   Tobacco comments:    "quit using snuff in the 1960'[name]"  Substance Use Topics   Alcohol use: No  Marital Status: Married    No orders of the defined types were placed in this  encounter.  Review of Systems  Cardiovascular:  Positive for dyspnea on exertion. Negative for chest pain and leg swelling.   Objective:      06/30/2022    3:34 PM 06/02/2022   11:26 AM 05/26/2022   11:28 AM  Vitals with BMI  Height '5\' 7"'$  '5\' 7"'$  '5\' 7"'$   Weight 204 lbs 204 lbs 10 oz 209 lbs 3 oz  BMI 31.94 94.76 54.65  Systolic 035 465 681  Diastolic 71 62 79  Pulse 73 69 71    Physical Exam Neck:     Vascular: No carotid bruit or JVD.  Cardiovascular:     Rate and Rhythm: Normal rate. Rhythm irregular.     Pulses: Normal pulses and intact distal pulses.     Heart sounds: No murmur heard. Pulmonary:     Effort: Pulmonary effort is normal.     Breath sounds: Normal breath sounds.  Abdominal:     General: Bowel sounds are normal.     Palpations: Abdomen is soft.  Musculoskeletal:     Right lower leg: No edema.     Left lower leg: No edema.  Skin:    Capillary Refill: Capillary refill takes less than 2 seconds.    Radiology:  CXR 11/14/21: Chronic cardiomegaly. Unchanged mediastinal contours. Dense aortic atherosclerosis. Moderate bilateral pleural effusions with associated bibasilar opacities. Vascular congestion with suggestion of mild peripheral septal  thickening/pulmonary edema. Chronic biapical pleuroparenchymal scarring. Stable osseous structures.  Laboratory Examination:  Lab Results  Component Value Date   NA 140 04/24/2022   K 4.6 04/24/2022   CO2 29 04/24/2022   GLUCOSE 81 04/24/2022   BUN 33 (H) 04/24/2022   CREATININE 1.42 (H) 04/24/2022   CALCIUM 8.9 04/24/2022   EGFR 35 (L) 04/24/2022   GFRNONAA 42 (L) 11/14/2021       Latest Ref Rng & Units 04/24/2022    3:16 PM 04/06/2022    1:09 PM 11/14/2021    7:00 PM  CMP  Glucose 70 - 99 mg/dL 81  96  103   BUN 8 - 27 mg/dL 33  23  21   Creatinine 0.57 - 1.00 mg/dL 1.42  1.42  1.24   Sodium 134 - 144 mmol/L 140  141  140   Potassium 3.5 - 5.2 mmol/L 4.6  3.7  4.3   Chloride 96 - 106 mmol/L 100  100   104   CO2 20 - 29 mmol/L '29  24  28   '$ Calcium 8.7 - 10.3 mg/dL 8.9  9.2  9.5       Latest Ref Rng & Units 11/14/2021    7:00 PM 07/11/2020   12:53 AM 07/10/2020   12:27 AM  CBC  WBC 4.0 - 10.5 K/uL 6.5  9.1  9.5   Hemoglobin 12.0 - 15.0 g/dL 12.7  12.3  12.1   Hematocrit 36.0 - 46.0 % 40.0  37.9  38.5   Platelets 150 - 400 K/uL 186  281  338    Lipid Panel     Component Value Date/Time   TRIG 91 07/07/2020 1058   HEMOGLOBIN A1C Lab Results  Component Value Date   HGBA1C 5.5 09/24/2017   MPG 111.15 09/24/2017   Lab Results  Component Value Date   TSH 4.464 09/24/2017    BNP (last 3 results) Recent Labs    11/14/21 1900  BNP 305.2*   ProBNP (last 3 results) Recent Labs    04/06/22 1309 04/24/22 1516  PROBNP 1,126* 832*    External Labs:  Labs 02/20/2022:  proBNP 943.  Sodium 141, potassium 3.9, BUN 25, creatinine 1.47, EGFR 36.  mL.  A1c 5.2%.  Total cholesterol 159, triglycerides 69, HDL 50, LDL 95.  Allergies  Allergen Reactions   Amlodipine Swelling    Intolerance, LE edema    Zestoretic [Lisinopril-Hydrochlorothiazide] Itching and Rash     Current Outpatient Medications:    empagliflozin (JARDIANCE) 10 MG TABS tablet, Take 1 tablet (10 mg total) by mouth daily., Disp: 30 tablet, Rfl: 3   hydrALAZINE (APRESOLINE) 100 MG tablet, Take 100 mg by mouth 3 (three) times daily., Disp: , Rfl:    isosorbide dinitrate (ISORDIL) 30 MG tablet, TAKE 1 TABLET(30 MG) BY MOUTH THREE TIMES DAILY, Disp: 90 tablet, Rfl: 2   pindolol (VISKEN) 5 MG tablet, TAKE 1 TABLET(5 MG) BY MOUTH TWICE DAILY, Disp: 180 tablet, Rfl: 1   Potassium Chloride ER 20 MEQ TBCR, Take 1 tablet by mouth daily., Disp: , Rfl:    spironolactone (ALDACTONE) 25 MG tablet, Take 0.5 tablets (12.5 mg total) by mouth every morning., Disp: 30 tablet, Rfl: 2   XARELTO 15 MG TABS tablet, TAKE 1 TABLET BY MOUTH EVERY EVENING AFTER DINNER, Disp: 90 tablet, Rfl: 0   torsemide (DEMADEX) 20 MG tablet, Take 1  tablet (20 mg total) by mouth 2 (two) times daily at 10 am and 4 pm., Disp: 180 tablet,  Rfl: 3   CARDIAC STUDIES:   Renal Angio  [07/31/2014]: Left mild disease. Right mild to moderate disease. Unable to perform pressure pull back to evaluate significance. Does not appear high grade.   Lexiscan myoview stress test 10/12/2016: 1. The resting electrocardiogram demonstrated atrial fibrillation, normal resting conduction and nonspecific T changes. Stress EKG is non-diagnostic for ischemia as it a pharmacologic stress using Lexiscan. Rare PVC. Stress symptoms included dyspnea. 2. Left ventricular cavity is noted to be enlarged on the rest and stress studies with LV end diastolic volume of 720NO. SPECT images demonstrate homogeneous tracer distribution throughout the myocardium. No ischemia or scar. The left ventricular ejection fraction was calculated to be 41%. The ejection fraction may not be accurate and may be related to difficulty in getting EKG due to atrial fibrillation. This is a low risk study.  [facility]V ECHOCARDIOGRAM COMPLETE 03/31/2022  Narrative Echocardiogram 03/31/2022: Left ventricle cavity is normal in size. Normal left ventricular wall thickness. Mild global hypokinesis. LVEF 45-50%. Indeterminate diastolic filling pattern due to atrial fibrillation. Left atrial cavity is severely dilated. Right atrial cavity is moderately dilated. Trileaflet aortic valve.  Mild aortic valve leaflet calcification. Trace aortic regurgitation. Moderate (Grade II) mitral regurgitation. Moderate tricuspid regurgitation. Estimated pulmonary artery systolic pressure 56 mmHg. Mild pulmonic regurgitation. Previous study  2018 reported LVEF 56%, mild LA, RA dilatation, unable to estimate PASP-at least 33 mmHg.     EKG:  EKG 04/28/2022: Atrial fibrillation with controlled ventricular response at rate of 74 bpm, normal axis, anteroseptal infarct old.  Normal QT interval.  Low-voltage complexes, consider  pulmonary disease pattern.  No evidence of ischemia.  Compared to 03/05/2022, no change. Compared to compared to 03/29/2019, no significant change.  Assessment:       ICD-10-CM   1. Chronic diastolic (congestive) heart failure (HCC)  I50.32 torsemide (DEMADEX) 20 MG tablet    2. Permanent atrial fibrillation (HCC)  I48.21     3. Primary hypertension  I10     4. Stage 3b chronic kidney disease (HCC)  N18.32      CHA2DS2-VASc Score is 5.  Yearly risk of stroke: 7% (F, Age, HTN, CHF).  Score of 1=0.6; 2=2.2; 3=3.2; 4=4.8; 5=7.2; 6=9.8; 7=>9.8) -(CHF; HTN; vasc disease DM,  Female = 1; Age <65 =0; 65-74 = 1,  >75 =2; stroke/embolism= 2).    Recommendations:   Shannon [name] a 89 y.o. with permanent A. Fib, hypertension (No RAS by angio in 2016), stage IIIa-b chronic kidney disease, hyperlipidemia, obesity, frequent PVCs by electrocardiogram and history of gout.    This is a 2-week office visit for heart failure.   1. Chronic diastolic (congestive) heart failure ([place]) Patient has done remarkably well.  She has had complete resolution of her leg edema, there is no JVD and dyspnea has improved remarkably.  Advised her that this is her dry weight and to continue present medical regimen.  I will decrease torsemide from 40 mg twice daily to 20 mg twice daily.  Advised her to hold the torsemide if she has any acute illnesses like UTI or fever or gastroenteritis to avoid kidney complications.  I would like to see her back in 3 months for follow-up.  Presently enrolled in RPM, weights have remained very stable.  Compared to last office visit 2 weeks ago, JVD has resolved, leg edema has completely resolved as well.  2. Permanent atrial fibrillation [facility]) Patient is now rate controlled with regard to atrial fibrillation.  Continue pindolol.  3.  Primary hypertension Patient is presently not on an ACE inhibitor or an ARB due to kidney disease, although she is tolerating low-dose of  spironolactone.  Continue the same for now.  Continue isosorbide dinitrate and hydralazine.  Blood pressure under good control.  4. Stage 3b chronic kidney disease ([place]) Renal function has remained stable, I will continue to update labs as she follows up with her PCP as well.  In view of significant morbidity associated with renal dysfunction and also heart failure, I will see her back in 3 months for close monitoring.  Enrolled in RPM:    Weight from 06/03/22 - 06/28/2022 Average Weight Level 200.42 lbs Lowest Weight Level 198 lbs Highest Weight Level 203.4 lbs   06/28/2022 'Sunday at 06:02 AM        20'$ 2.2Lbs          Adrian Prows, MD, [facility] 06/30/2022, 4:02 PM Office: [phone] Fax: [phone] Pager: [phone]

## 2022-06-30 NOTE — Patient Instructions (Addendum)
Please do not take torsemide or the potassium pill if you are ill or having dehydration.  Continue to weigh yourself on a daily basis.

## 2022-06-30 NOTE — Telephone Encounter (Signed)
Patient's weight has been very stable, continue observation for heart failure.

## 2022-06-30 NOTE — Telephone Encounter (Signed)
Patient's weight has been relatively stable since last office visit in December.  Weight from 12/12 - 12/19 Average Weight Level 202.33 lbs Lowest Weight Level 200.8 lbs Highest Weight Level 203.8 lbs  Weight from 12/20 - 1/14 Average Weight Level 200.42 lbs Lowest Weight Level 198 lbs Highest Weight Level 203.4 lbs  06/28/2022 'Sunday at 06:02 AM 202.2      06/28/2022 Sunday at 06:01 AM 202.2      06/27/2022 Saturday at 08:45 AM 200.2      06/26/2022 Friday at 09:37 AM 199      06/26/2022 Friday at 05:32 AM 198      06/25/2022 Thursday at 08:42 AM 199.4      06/25/2022 Thursday at 08:38 AM 199.4      06/24/2022 Wednesday at 09:08 AM 203.4      01'$ /02/2023 Tuesday at 10:00 AM 201.4

## 2022-07-21 DIAGNOSIS — I5032 Chronic diastolic (congestive) heart failure: Secondary | ICD-10-CM | POA: Diagnosis not present

## 2022-08-12 ENCOUNTER — Other Ambulatory Visit: Payer: Self-pay | Admitting: Cardiology

## 2022-08-12 DIAGNOSIS — R7309 Other abnormal glucose: Secondary | ICD-10-CM | POA: Diagnosis not present

## 2022-08-12 DIAGNOSIS — N1832 Chronic kidney disease, stage 3b: Secondary | ICD-10-CM | POA: Diagnosis not present

## 2022-08-12 DIAGNOSIS — I5032 Chronic diastolic (congestive) heart failure: Secondary | ICD-10-CM | POA: Diagnosis not present

## 2022-08-12 DIAGNOSIS — I130 Hypertensive heart and chronic kidney disease with heart failure and stage 1 through stage 4 chronic kidney disease, or unspecified chronic kidney disease: Secondary | ICD-10-CM | POA: Diagnosis not present

## 2022-08-12 DIAGNOSIS — I701 Atherosclerosis of renal artery: Secondary | ICD-10-CM | POA: Diagnosis not present

## 2022-08-12 DIAGNOSIS — E782 Mixed hyperlipidemia: Secondary | ICD-10-CM | POA: Diagnosis not present

## 2022-08-12 DIAGNOSIS — I5033 Acute on chronic diastolic (congestive) heart failure: Secondary | ICD-10-CM

## 2022-08-12 DIAGNOSIS — D6869 Other thrombophilia: Secondary | ICD-10-CM | POA: Diagnosis not present

## 2022-08-12 DIAGNOSIS — I480 Paroxysmal atrial fibrillation: Secondary | ICD-10-CM | POA: Diagnosis not present

## 2022-08-12 DIAGNOSIS — I25118 Atherosclerotic heart disease of native coronary artery with other forms of angina pectoris: Secondary | ICD-10-CM | POA: Diagnosis not present

## 2022-08-17 DIAGNOSIS — L84 Corns and callosities: Secondary | ICD-10-CM | POA: Diagnosis not present

## 2022-08-17 DIAGNOSIS — M79671 Pain in right foot: Secondary | ICD-10-CM | POA: Diagnosis not present

## 2022-08-20 DIAGNOSIS — I5032 Chronic diastolic (congestive) heart failure: Secondary | ICD-10-CM | POA: Diagnosis not present

## 2022-09-07 DIAGNOSIS — L84 Corns and callosities: Secondary | ICD-10-CM | POA: Diagnosis not present

## 2022-09-13 DIAGNOSIS — E1122 Type 2 diabetes mellitus with diabetic chronic kidney disease: Secondary | ICD-10-CM | POA: Diagnosis not present

## 2022-09-13 DIAGNOSIS — I130 Hypertensive heart and chronic kidney disease with heart failure and stage 1 through stage 4 chronic kidney disease, or unspecified chronic kidney disease: Secondary | ICD-10-CM | POA: Diagnosis not present

## 2022-09-13 DIAGNOSIS — I5032 Chronic diastolic (congestive) heart failure: Secondary | ICD-10-CM | POA: Diagnosis not present

## 2022-09-13 DIAGNOSIS — N184 Chronic kidney disease, stage 4 (severe): Secondary | ICD-10-CM | POA: Diagnosis not present

## 2022-09-22 ENCOUNTER — Other Ambulatory Visit: Payer: Self-pay | Admitting: Cardiology

## 2022-09-22 DIAGNOSIS — I5033 Acute on chronic diastolic (congestive) heart failure: Secondary | ICD-10-CM

## 2022-09-29 ENCOUNTER — Telehealth: Payer: Self-pay

## 2022-09-29 ENCOUNTER — Ambulatory Visit: Payer: Medicare Other | Admitting: Cardiology

## 2022-09-29 NOTE — Telephone Encounter (Signed)
Patient is not consistent in taking her weight daily. Patient had no weight readings from 3/21-4/11. She has had significant weight increase in that time. Have been trying to reach patient regarding gain.  14-day summary Weight lb -- 212.1 (211.2 - 213.4)    09/27/22 8:18 AM 212.2    09/26/22 7:31 AM 211.2     09/25/22 9:42 AM 211.2       09/25/22 6:27 AM 212.4    09/24/22 8:55 AM 212.0       09/03/22 9:06 AM 204.4        08/30/22 9:00 AM 207.2    08/20/22 5:33 AM 204.4    08/13/22 8:27 AM 205.0    08/13/22 8:22 AM 205.6

## 2022-10-05 DIAGNOSIS — R7309 Other abnormal glucose: Secondary | ICD-10-CM | POA: Diagnosis not present

## 2022-10-05 DIAGNOSIS — E782 Mixed hyperlipidemia: Secondary | ICD-10-CM | POA: Diagnosis not present

## 2022-10-05 DIAGNOSIS — I10 Essential (primary) hypertension: Secondary | ICD-10-CM | POA: Diagnosis not present

## 2022-10-05 DIAGNOSIS — Z0000 Encounter for general adult medical examination without abnormal findings: Secondary | ICD-10-CM | POA: Diagnosis not present

## 2022-10-05 DIAGNOSIS — I130 Hypertensive heart and chronic kidney disease with heart failure and stage 1 through stage 4 chronic kidney disease, or unspecified chronic kidney disease: Secondary | ICD-10-CM | POA: Diagnosis not present

## 2022-10-05 DIAGNOSIS — D6869 Other thrombophilia: Secondary | ICD-10-CM | POA: Diagnosis not present

## 2022-10-05 DIAGNOSIS — I25118 Atherosclerotic heart disease of native coronary artery with other forms of angina pectoris: Secondary | ICD-10-CM | POA: Diagnosis not present

## 2022-10-05 DIAGNOSIS — I480 Paroxysmal atrial fibrillation: Secondary | ICD-10-CM | POA: Diagnosis not present

## 2022-10-05 DIAGNOSIS — N1832 Chronic kidney disease, stage 3b: Secondary | ICD-10-CM | POA: Diagnosis not present

## 2022-10-05 DIAGNOSIS — Z23 Encounter for immunization: Secondary | ICD-10-CM | POA: Diagnosis not present

## 2022-10-05 DIAGNOSIS — I701 Atherosclerosis of renal artery: Secondary | ICD-10-CM | POA: Diagnosis not present

## 2022-10-05 DIAGNOSIS — I5032 Chronic diastolic (congestive) heart failure: Secondary | ICD-10-CM | POA: Diagnosis not present

## 2022-10-12 ENCOUNTER — Encounter: Payer: Self-pay | Admitting: Cardiology

## 2022-10-12 ENCOUNTER — Telehealth: Payer: Self-pay

## 2022-10-12 ENCOUNTER — Ambulatory Visit: Payer: Medicare Other | Admitting: Cardiology

## 2022-10-12 VITALS — BP 131/66 | HR 71 | Resp 16 | Ht 67.0 in | Wt 211.8 lb

## 2022-10-12 DIAGNOSIS — I10 Essential (primary) hypertension: Secondary | ICD-10-CM

## 2022-10-12 DIAGNOSIS — I5032 Chronic diastolic (congestive) heart failure: Secondary | ICD-10-CM | POA: Diagnosis not present

## 2022-10-12 DIAGNOSIS — I4821 Permanent atrial fibrillation: Secondary | ICD-10-CM | POA: Diagnosis not present

## 2022-10-12 NOTE — Telephone Encounter (Signed)
Patient weight has trended back down towards March 2024 baseline (208-209 lb). Patient has not sent in a weight reading since 4/22.  Weight lb -- 210.6 (208.2 - 213.4)

## 2022-10-12 NOTE — Progress Notes (Signed)
Subjective:  Primary Physician:  Georgianne Fick, MD  Patient ID: Shannon [name], female    DOB: 1930-08-17, 89 y.o.   MRN: [id number]  Chief Complaint  Patient presents with   Chronic diastolic (congestive) heart failure    Follow-up   HPI: Shannon [name]  is a 89 y.o. female with permanent A. Fib, hypertension (No RAS by angio in 2016), stage IIIa-b chronic kidney disease, hyperlipidemia, obesity, frequent PVCs by electrocardiogram and history of gout.  This is a 87-month office visit for follow-up of heart failure. She has lost weight and has been strict with her diet.  She has no specific complaints today although she has chronic dyspnea.  She is accompanied by her daughter.  States that her dyspnea has improved significantly.  She has not had any further leg edema.  Past Medical History:  Diagnosis Date   Acute exacerbation of CHF (congestive heart failure) (HCC) 09/24/2017   Arthritis    "all over"   Atrial fibrillation (HCC)    Chronic diastolic CHF (congestive heart failure) (HCC) 09/24/2017   Chronic kidney disease    Dyspnea    Gout    Hypercholesteremia    Hypertension    Mitral regurgitation     Past Surgical History:  Procedure Laterality Date   BREAST BIOPSY Left 1984   CARDIOVERSION N/A 10/27/2016   Procedure: CARDIOVERSION;  Surgeon: Yates Decamp, MD;  Location: [facility] ENDOSCOPY;  Service: Cardiovascular;  Laterality: N/A;   CATARACT EXTRACTION, BILATERAL Bilateral 2000's   RENAL ANGIOGRAM Bilateral 07/31/2014   Procedure: RENAL ANGIOGRAM;  Surgeon: Pamella Pert, MD;  Location: [facility] CATH LAB;  Service: Cardiovascular;  Laterality: Bilateral;   Social History   Tobacco Use   Smoking status: Never   Smokeless tobacco: Former    Types: Snuff   Tobacco comments:    "quit using snuff in the 1960's"  Substance Use Topics   Alcohol use: No  Marital Status: Married    No orders of the defined types were placed in this encounter.  Review of Systems   Cardiovascular:  Positive for dyspnea on exertion. Negative for chest pain and leg swelling.   Objective:      10/12/2022    3:13 PM 06/30/2022    3:34 PM 06/02/2022   11:26 AM  Vitals with BMI  Height 5\' 7"  5\' 7"  5\' 7"   Weight 211 lbs 13 oz 204 lbs 204 lbs 10 oz  BMI 33.16 31.94 32.04  Systolic 131 137 841  Diastolic 66 71 62  Pulse 71 73 69    Physical Exam Neck:     Vascular: No carotid bruit or JVD.  Cardiovascular:     Rate and Rhythm: Normal rate. Rhythm irregular.     Pulses: Normal pulses and intact distal pulses.     Heart sounds: No murmur heard. Pulmonary:     Effort: Pulmonary effort is normal.     Breath sounds: Normal breath sounds.  Abdominal:     General: Bowel sounds are normal.     Palpations: Abdomen is soft.  Musculoskeletal:     Right lower leg: No edema.     Left lower leg: No edema.  Skin:    Capillary Refill: Capillary refill takes less than 2 seconds.    Radiology:  CXR 11/14/21: Chronic cardiomegaly. Unchanged mediastinal contours. Dense aortic atherosclerosis. Moderate bilateral pleural effusions with associated bibasilar opacities. Vascular congestion with suggestion of mild peripheral septal thickening/pulmonary edema. Chronic biapical pleuroparenchymal scarring. Stable osseous structures.  Laboratory Examination:  Lab Results  Component Value Date   NA 140 04/24/2022   K 4.6 04/24/2022   CO2 29 04/24/2022   GLUCOSE 81 04/24/2022   BUN 33 (H) 04/24/2022   CREATININE 1.42 (H) 04/24/2022   CALCIUM 8.9 04/24/2022   EGFR 35 (L) 04/24/2022   GFRNONAA 42 (L) 11/14/2021       Latest Ref Rng & Units 04/24/2022    3:16 PM 04/06/2022    1:09 PM 11/14/2021    7:00 PM  CMP  Glucose 70 - 99 mg/dL 81  96  161   BUN 8 - 27 mg/dL 33  23  21   Creatinine 0.57 - 1.00 mg/dL 0.96  0.45  4.09   Sodium 134 - 144 mmol/L 140  141  140   Potassium 3.5 - 5.2 mmol/L 4.6  3.7  4.3   Chloride 96 - 106 mmol/L 100  100  104   CO2 20 - 29 mmol/L 29  24   28    Calcium 8.7 - 10.3 mg/dL 8.9  9.2  9.5       Latest Ref Rng & Units 11/14/2021    7:00 PM 07/11/2020   12:53 AM 07/10/2020   12:27 AM  CBC  WBC 4.0 - 10.5 K/uL 6.5  9.1  9.5   Hemoglobin 12.0 - 15.0 g/dL 81.1  91.4  78.2   Hematocrit 36.0 - 46.0 % 40.0  37.9  38.5   Platelets 150 - 400 K/uL 186  281  338    Lipid Panel     Component Value Date/Time   TRIG 91 07/07/2020 1058   HEMOGLOBIN A1C Lab Results  Component Value Date   HGBA1C 5.5 09/24/2017   MPG 111.15 09/24/2017   Lab Results  Component Value Date   TSH 4.464 09/24/2017    BNP (last 3 results) Recent Labs    11/14/21 1900  BNP 305.2*   ProBNP (last 3 results) Recent Labs    04/06/22 1309 04/24/22 1516  PROBNP 1,126* 832*    External Labs:  Labs 02/20/2022:  proBNP 943.  Sodium 141, potassium 3.9, BUN 25, creatinine 1.47, EGFR 36.  mL.  A1c 5.2%.  Total cholesterol 159, triglycerides 69, HDL 50, LDL 95.   CARDIAC STUDIES:   Renal Angio  [07/31/2014]: Left mild disease. Right mild to moderate disease. Unable to perform pressure pull back to evaluate significance. Does not appear high grade.   Lexiscan myoview stress test 10/12/2016: 1. The resting electrocardiogram demonstrated atrial fibrillation, normal resting conduction and nonspecific T changes. Stress EKG is non-diagnostic for ischemia as it a pharmacologic stress using Lexiscan. Rare PVC. Stress symptoms included dyspnea. 2. Left ventricular cavity is noted to be enlarged on the rest and stress studies with LV end diastolic volume of . SPECT images demonstrate homogeneous tracer distribution throughout the myocardium. No ischemia or scar. The left ventricular ejection fraction was calculated to be 41%. The ejection fraction may not be accurate and may be related to difficulty in getting EKG due to atrial fibrillation. This is a low risk study.  PCV ECHOCARDIOGRAM COMPLETE 03/31/2022  Narrative Echocardiogram 03/31/2022: Left  ventricle cavity is normal in size. Normal left ventricular wall thickness. Mild global hypokinesis. LVEF 45-50%. Indeterminate diastolic filling pattern due to atrial fibrillation. Left atrial cavity is severely dilated. Right atrial cavity is moderately dilated. Trileaflet aortic valve.  Mild aortic valve leaflet calcification. Trace aortic regurgitation. Moderate (Grade II) mitral regurgitation. Moderate tricuspid regurgitation. Estimated pulmonary artery systolic pressure  56 mmHg. Mild pulmonic regurgitation. Previous study  2018 reported LVEF 56%, mild LA, RA dilatation, unable to estimate PASP-at least 33 mmHg.     EKG:  EKG 04/28/2022: Atrial fibrillation with controlled ventricular response at rate of 74 bpm, normal axis, anteroseptal infarct old.  Normal QT interval.  Low-voltage complexes, consider pulmonary disease pattern.  No evidence of ischemia.  Compared to 03/05/2022, no change. Compared to compared to 03/29/2019, no significant change.  Medications and allergies   Allergies  Allergen Reactions   Amlodipine Swelling    Intolerance, LE edema    Zestoretic [Lisinopril-Hydrochlorothiazide] Itching and Rash    Current Outpatient Medications:    hydrALAZINE (APRESOLINE) 100 MG tablet, Take 100 mg by mouth 3 (three) times daily., Disp: , Rfl:    isosorbide dinitrate (ISORDIL) 30 MG tablet, TAKE 1 TABLET(30 MG) BY MOUTH THREE TIMES DAILY, Disp: 90 tablet, Rfl: 2   JARDIANCE 10 MG TABS tablet, TAKE 1 TABLET(10 MG) BY MOUTH DAILY, Disp: 30 tablet, Rfl: 3   pindolol (VISKEN) 5 MG tablet, TAKE 1 TABLET(5 MG) BY MOUTH TWICE DAILY, Disp: 180 tablet, Rfl: 1   Potassium Chloride ER 20 MEQ TBCR, Take 1 tablet by mouth daily., Disp: , Rfl:    spironolactone (ALDACTONE) 25 MG tablet, TAKE 1/2 TABLET(12.5 MG) BY MOUTH EVERY MORNING, Disp: 30 tablet, Rfl: 2   torsemide (DEMADEX) 20 MG tablet, Take 1 tablet (20 mg total) by mouth 2 (two) times daily at 10 am and 4 pm., Disp: 180 tablet, Rfl:  3   XARELTO 15 MG TABS tablet, TAKE 1 TABLET BY MOUTH EVERY EVENING AFTER DINNER, Disp: 90 tablet, Rfl: 0   Assessment:       ICD-10-CM   1. Chronic diastolic (congestive) heart failure (HCC)  I50.32     2. Permanent atrial fibrillation (HCC)  I48.21     3. Primary hypertension  I10      CHA2DS2-VASc Score is 5.  Yearly risk of stroke: 7% (F, Age, HTN, CHF).  Score of 1=0.6; 2=2.2; 3=3.2; 4=4.8; 5=7.2; 6=9.8; 7=>9.8) -(CHF; HTN; vasc disease DM,  Female = 1; Age <65 =0; 65-74 = 1,  >75 =2; stroke/embolism= 2).    Recommendations:   Shannon [name] s a 89 y.o. with permanent A. Fib, hypertension (No RAS by angio in 2016), stage IIIa-b chronic kidney disease, hyperlipidemia, obesity, frequent PVCs by electrocardiogram and history of gout.    This is a 4 month office visit for heart failure.   1. Chronic diastolic (congestive) heart failure (HCC) Patient has done remarkably well with regard to making lifestyle changes and diet changes.  She is adhering to low-salt diet and also low calorie diet, I reviewed her weight, very stable.  Her leg edema has completely resolved, lungs are clear and there is no clinical evidence of acute decompensated heart failure.  I have congratulated her and give her encouragement to continue present lifestyle management.  2. Permanent atrial fibrillation [facility]) Patient has permanent atrial fibrillation but remains asymptomatic and tolerating anticoagulation well.  Continue the same.  3. Primary hypertension Since weight loss, blood pressure excellent control.  She is tolerating all the medications well.  Renal functions remained stable stage IIIb chronic kidney disease.  I will see her back in 6 months for follow-up.  Enrolled in RPM:    Patient weight has trended back down towards March 2024 baseline (208-209 lb). Patient has not sent in a weight reading since 4/22.   Weight lb -- 210.6 (208.2 -  213.4)        Yates Decamp, MD, [facility] 10/12/2022, 10:12  PM Office: [phone] Fax: [phone] Pager: [phone]

## 2022-10-19 DIAGNOSIS — I5032 Chronic diastolic (congestive) heart failure: Secondary | ICD-10-CM | POA: Diagnosis not present

## 2022-12-16 DIAGNOSIS — L84 Corns and callosities: Secondary | ICD-10-CM | POA: Diagnosis not present

## 2022-12-16 DIAGNOSIS — M79671 Pain in right foot: Secondary | ICD-10-CM | POA: Diagnosis not present

## 2023-01-16 ENCOUNTER — Other Ambulatory Visit: Payer: Self-pay | Admitting: Cardiology

## 2023-01-16 DIAGNOSIS — I5033 Acute on chronic diastolic (congestive) heart failure: Secondary | ICD-10-CM

## 2023-01-18 DIAGNOSIS — M79671 Pain in right foot: Secondary | ICD-10-CM | POA: Diagnosis not present

## 2023-01-18 DIAGNOSIS — L84 Corns and callosities: Secondary | ICD-10-CM | POA: Diagnosis not present

## 2023-03-04 DIAGNOSIS — I480 Paroxysmal atrial fibrillation: Secondary | ICD-10-CM | POA: Diagnosis not present

## 2023-03-04 DIAGNOSIS — R21 Rash and other nonspecific skin eruption: Secondary | ICD-10-CM | POA: Diagnosis not present

## 2023-03-04 DIAGNOSIS — I130 Hypertensive heart and chronic kidney disease with heart failure and stage 1 through stage 4 chronic kidney disease, or unspecified chronic kidney disease: Secondary | ICD-10-CM | POA: Diagnosis not present

## 2023-03-04 DIAGNOSIS — L309 Dermatitis, unspecified: Secondary | ICD-10-CM | POA: Diagnosis not present

## 2023-03-04 DIAGNOSIS — N1832 Chronic kidney disease, stage 3b: Secondary | ICD-10-CM | POA: Diagnosis not present

## 2023-03-20 ENCOUNTER — Other Ambulatory Visit: Payer: Self-pay | Admitting: Cardiology

## 2023-03-20 DIAGNOSIS — I5033 Acute on chronic diastolic (congestive) heart failure: Secondary | ICD-10-CM

## 2023-03-22 DIAGNOSIS — L309 Dermatitis, unspecified: Secondary | ICD-10-CM | POA: Diagnosis not present

## 2023-04-05 DIAGNOSIS — I701 Atherosclerosis of renal artery: Secondary | ICD-10-CM | POA: Diagnosis not present

## 2023-04-05 DIAGNOSIS — L84 Corns and callosities: Secondary | ICD-10-CM | POA: Diagnosis not present

## 2023-04-05 DIAGNOSIS — M79671 Pain in right foot: Secondary | ICD-10-CM | POA: Diagnosis not present

## 2023-04-05 DIAGNOSIS — R7309 Other abnormal glucose: Secondary | ICD-10-CM | POA: Diagnosis not present

## 2023-04-05 DIAGNOSIS — I25118 Atherosclerotic heart disease of native coronary artery with other forms of angina pectoris: Secondary | ICD-10-CM | POA: Diagnosis not present

## 2023-04-05 DIAGNOSIS — I5032 Chronic diastolic (congestive) heart failure: Secondary | ICD-10-CM | POA: Diagnosis not present

## 2023-04-05 DIAGNOSIS — I130 Hypertensive heart and chronic kidney disease with heart failure and stage 1 through stage 4 chronic kidney disease, or unspecified chronic kidney disease: Secondary | ICD-10-CM | POA: Diagnosis not present

## 2023-04-05 DIAGNOSIS — I480 Paroxysmal atrial fibrillation: Secondary | ICD-10-CM | POA: Diagnosis not present

## 2023-04-12 DIAGNOSIS — I701 Atherosclerosis of renal artery: Secondary | ICD-10-CM | POA: Diagnosis not present

## 2023-04-12 DIAGNOSIS — I25118 Atherosclerotic heart disease of native coronary artery with other forms of angina pectoris: Secondary | ICD-10-CM | POA: Diagnosis not present

## 2023-04-12 DIAGNOSIS — I5032 Chronic diastolic (congestive) heart failure: Secondary | ICD-10-CM | POA: Diagnosis not present

## 2023-04-12 DIAGNOSIS — I10 Essential (primary) hypertension: Secondary | ICD-10-CM | POA: Diagnosis not present

## 2023-04-12 DIAGNOSIS — D6869 Other thrombophilia: Secondary | ICD-10-CM | POA: Diagnosis not present

## 2023-04-12 DIAGNOSIS — E782 Mixed hyperlipidemia: Secondary | ICD-10-CM | POA: Diagnosis not present

## 2023-04-12 DIAGNOSIS — I480 Paroxysmal atrial fibrillation: Secondary | ICD-10-CM | POA: Diagnosis not present

## 2023-04-12 DIAGNOSIS — I130 Hypertensive heart and chronic kidney disease with heart failure and stage 1 through stage 4 chronic kidney disease, or unspecified chronic kidney disease: Secondary | ICD-10-CM | POA: Diagnosis not present

## 2023-04-12 DIAGNOSIS — N184 Chronic kidney disease, stage 4 (severe): Secondary | ICD-10-CM | POA: Diagnosis not present

## 2023-04-12 DIAGNOSIS — Z23 Encounter for immunization: Secondary | ICD-10-CM | POA: Diagnosis not present

## 2023-04-12 DIAGNOSIS — R7309 Other abnormal glucose: Secondary | ICD-10-CM | POA: Diagnosis not present

## 2023-04-13 ENCOUNTER — Ambulatory Visit: Payer: Self-pay | Admitting: Cardiology

## 2023-05-31 ENCOUNTER — Other Ambulatory Visit: Payer: Self-pay | Admitting: Cardiology

## 2023-05-31 DIAGNOSIS — I5033 Acute on chronic diastolic (congestive) heart failure: Secondary | ICD-10-CM

## 2023-06-07 DIAGNOSIS — L84 Corns and callosities: Secondary | ICD-10-CM | POA: Diagnosis not present

## 2023-06-07 DIAGNOSIS — M79671 Pain in right foot: Secondary | ICD-10-CM | POA: Diagnosis not present

## 2023-06-10 NOTE — Progress Notes (Unsigned)
Cardiology Office Note:  .   Date:  06/12/2023  ID:  [name], DOB 1931-01-11, MRN [id number] PCP: Georgianne Fick, MD  [facility] Providers Cardiologist:  None   History of Present Illness: .   Shannon [name] is a 89 y.o. AA female with permanent A. Fib, hypertension (No RAS by angio in 2016), stage IIIa-b chronic kidney disease, hyperlipidemia, obesity, frequent PVCs by electrocardiogram and history of gout.   Discussed the use of AI scribe software for clinical note transcription with the patient, who gave verbal consent to proceed.  History of Present Illness   The patient, with a history of heart failure, hypertension, and atrial fibrillation, presents for a routine follow-up. She reports feeling well with no new complaints. She denies any recent changes in her breathing or leg swelling, indicating stable heart failure. She has been adhering to a careful diet, as evidenced by stable weight and blood pressure. She uses a cane for mobility and is able to get around without difficulty. She has been taking Jardiance, spironolactone, torsemide, and pindolol for heart failure and hypertension, and Xarelto for atrial fibrillation. She also has stage 3B kidney disease, with slightly worsened kidney function.      Review of Systems  Cardiovascular:  Positive for leg swelling (stable and mild). Negative for chest pain, dyspnea on exertion, orthopnea, palpitations and syncope.   Labs   Lab Results  Component Value Date   NA 140 04/24/2022   K 4.6 04/24/2022   CO2 29 04/24/2022   GLUCOSE 81 04/24/2022   BUN 33 (H) 04/24/2022   CREATININE 1.42 (H) 04/24/2022   CALCIUM 8.9 04/24/2022   EGFR 35 (L) 04/24/2022   GFRNONAA 42 (L) 11/14/2021      Latest Ref Rng & Units 04/24/2022    3:16 PM 04/06/2022    1:09 PM 11/14/2021    7:00 PM  BMP  Glucose 70 - 99 mg/dL 81  96  409   BUN 8 - 27 mg/dL 33  23  21   Creatinine 0.57 - 1.00 mg/dL 8.11  9.14  7.82   BUN/Creat  Ratio 12 - 28 23  16     Sodium 134 - 144 mmol/L 140  141  140   Potassium 3.5 - 5.2 mmol/L 4.6  3.7  4.3   Chloride 96 - 106 mmol/L 100  100  104   CO2 20 - 29 mmol/L 29  24  28    Calcium 8.7 - 10.3 mg/dL 8.9  9.2  9.5       Latest Ref Rng & Units 11/14/2021    7:00 PM 07/11/2020   12:53 AM 07/10/2020   12:27 AM  CBC  WBC 4.0 - 10.5 K/uL 6.5  9.1  9.5   Hemoglobin 12.0 - 15.0 g/dL 95.6  21.3  08.6   Hematocrit 36.0 - 46.0 % 40.0  37.9  38.5   Platelets 150 - 400 K/uL 186  281  338    External Labs:  Labs 04/13/2023:  Serum glucose 92 mg, BUN 35, creatinine 1.76, EGFR 27 mL, sodium 144, potassium 4.4, LFTs normal.  Total cholesterol 203, triglycerides 76, HDL 53, LDL 136.  A1c 5.6%.  Physical Exam:   VS:  BP 110/60 (BP Location: Left Arm, Patient Position: Sitting, Cuff Size: Large)   Pulse 70   Resp 16   Ht 5\' 7"  (1.702 m)   Wt 227 lb 3.2 oz (103.1 kg)   SpO2 96%   BMI 35.58 kg/m  Wt Readings from Last 3 Encounters:  06/11/23 227 lb 3.2 oz (103.1 kg)  10/12/22 211 lb 12.8 oz (96.1 kg)  06/30/22 204 lb (92.5 kg)     Physical Exam Neck:     Vascular: No carotid bruit or JVD.  Cardiovascular:     Rate and Rhythm: Normal rate. Rhythm irregular.     Pulses: Normal pulses and intact distal pulses.     Heart sounds: No murmur heard. Pulmonary:     Effort: Pulmonary effort is normal.     Breath sounds: Normal breath sounds.  Abdominal:     General: Bowel sounds are normal.     Palpations: Abdomen is soft.  Musculoskeletal:     Right lower leg: Edema (2+ ankle edema) present.     Left lower leg: Edema (2+ ankle edema) present.  Skin:    Capillary Refill: Capillary refill takes less than 2 seconds.   Studies Reviewed: Marland Kitchen    ECHOCARDIOGRAM COMPLETE 03/31/2022  Left ventricle cavity is normal in size. Normal left ventricular wall thickness. Mild global hypokinesis. LVEF 45-50%. Indeterminate diastolic filling pattern due to atrial fibrillation. Left atrial cavity is  severely dilated. Right atrial cavity is moderately dilated. Trileaflet aortic valve.  Mild aortic valve leaflet calcification. Trace aortic regurgitation. Moderate (Grade II) mitral regurgitation. Moderate tricuspid regurgitation. Estimated pulmonary artery systolic pressure 56 mmHg. Mild pulmonic regurgitation. Previous study 2018 reorted LVEF 56%, mild LA, RA dilatation, unable to estimate PASP-at least 33 mmHg.  EKG:    EKG Interpretation Date/Time:  Friday June 11 2023 10:31:33 EST Ventricular Rate:  70 PR Interval:    QRS Duration:  68 QT Interval:  402 QTC Calculation: 434 R Axis:   17  Text Interpretation: EKG 06/11/2023: Atrial fibrillation with controlled ventricular response at the rate of 70 bpm, normal axis, anterolateral infarct old.  Compared to 11/14/2021, no significant change. Confirmed by Delrae Rend [id number]) on 06/11/2023 10:38:27 AM    EKG 04/28/2022: Atrial fibrillation with controlled ventricular response at rate of 74 bpm, normal axis, anteroseptal infarct old. Normal QT interval. Low-voltage complexes, consider pulmonary disease pattern. No evidence of ischemia.   Medications and allergies    Allergies  Allergen Reactions   Amlodipine Swelling    Intolerance, LE edema    Zestoretic [Lisinopril-Hydrochlorothiazide] Itching and Rash     Current Outpatient Medications:    empagliflozin (JARDIANCE) 10 MG TABS tablet, TAKE 1 TABLET(10 MG) BY MOUTH DAILY, Disp: 30 tablet, Rfl: 0   hydrALAZINE (APRESOLINE) 100 MG tablet, Take 100 mg by mouth 3 (three) times daily., Disp: , Rfl:    isosorbide dinitrate (ISORDIL) 30 MG tablet, TAKE 1 TABLET(30 MG) BY MOUTH THREE TIMES DAILY, Disp: 90 tablet, Rfl: 2   pindolol (VISKEN) 5 MG tablet, TAKE 1 TABLET(5 MG) BY MOUTH TWICE DAILY, Disp: 180 tablet, Rfl: 1   Potassium Chloride ER 20 MEQ TBCR, Take 1 tablet by mouth daily., Disp: , Rfl:    torsemide (DEMADEX) 20 MG tablet, Take 1 tablet (20 mg total) by mouth 2 (two)  times daily at 10 am and 4 pm., Disp: 180 tablet, Rfl: 3   XARELTO 15 MG TABS tablet, TAKE 1 TABLET BY MOUTH EVERY EVENING AFTER DINNER, Disp: 90 tablet, Rfl: 0   ASSESSMENT AND PLAN: .      ICD-10-CM   1. Chronic diastolic (congestive) heart failure (HCC)  I50.32 EKG 12-Lead    CANCELED: EKG 12-Lead    2. Permanent atrial fibrillation (HCC)  I48.21  3. Primary hypertension  I10     4. Stage 3b chronic kidney disease (HCC)  N18.32      Assessment and Plan    Chronic Diastolic Heart Failure Stable with no current signs of decompensation. Blood pressure well controlled on current regimen of hydralazine, isosorbide dinitrate, pindolol, and torsemide. -Continue current regimen, but discontinue spironolactone to potentially improve kidney function. -Reduce Torsemide from 40 mg BID to 20 mg BID  Atrial Fibrillation Well controlled HR on pindolol and tolerating Xarelto 15mg  in the evening without bleeding diathesis, external labs reviewed. -Continue current regimen.  Stage 3B Chronic Kidney Disease Slight worsening of kidney function noted. -Discontinue spironolactone 12.5 mg daily to potentially improve kidney function. -Follow-up with nephrologist on January 7th, 2025.  Hypertension Well controlled on current regimen of hydralazine, isosorbide dinitrate, pindolol, and torsemide. -Continue current regimen.  General Health Maintenance -Continue to monitor diet and maintain physical activity. -Ensure patient has an accurate and updated list of medications to avoid confusion. -Follow-up in 1 year.   Signed,  Yates Decamp, MD, [facility] 06/12/2023, 5:18 AM [facility] [address] [place], [place] [address] Phone: [phone]. Fax:  [phone]

## 2023-06-11 ENCOUNTER — Encounter: Payer: Self-pay | Admitting: Cardiology

## 2023-06-11 ENCOUNTER — Ambulatory Visit: Payer: Medicare Other | Attending: Cardiology | Admitting: Cardiology

## 2023-06-11 VITALS — BP 110/60 | HR 70 | Resp 16 | Ht 67.0 in | Wt 227.2 lb

## 2023-06-11 DIAGNOSIS — I10 Essential (primary) hypertension: Secondary | ICD-10-CM | POA: Diagnosis not present

## 2023-06-11 DIAGNOSIS — I5032 Chronic diastolic (congestive) heart failure: Secondary | ICD-10-CM

## 2023-06-11 DIAGNOSIS — N1832 Chronic kidney disease, stage 3b: Secondary | ICD-10-CM | POA: Diagnosis not present

## 2023-06-11 DIAGNOSIS — I4821 Permanent atrial fibrillation: Secondary | ICD-10-CM

## 2023-06-11 NOTE — Patient Instructions (Signed)
Medication Instructions:  STOP Spironolactone *If you need a refill on your cardiac medications before your next appointment, please call your pharmacy*  Follow-Up: At [facility], you and your health needs are our priority.  As part of our continuing mission to provide you with exceptional heart care, we have created designated Provider Care Teams.  These Care Teams include your primary Cardiologist (physician) and Advanced Practice Providers (APPs -  Physician Assistants and Nurse Practitioners) who all work together to provide you with the care you need, when you need it.  We recommend signing up for the patient portal called "MyChart".  Sign up information is provided on this After Visit Summary.  MyChart is used to connect with patients for Virtual Visits (Telemedicine).  Patients are able to view lab/test results, encounter notes, upcoming appointments, etc.  Non-urgent messages can be sent to your provider as well.   To learn more about what you can do with MyChart, go to [email or web address].    Your next appointment:   1 year(s)  Provider:   Yates Decamp, MD

## 2023-06-22 DIAGNOSIS — N184 Chronic kidney disease, stage 4 (severe): Secondary | ICD-10-CM | POA: Diagnosis not present

## 2023-06-22 DIAGNOSIS — N189 Chronic kidney disease, unspecified: Secondary | ICD-10-CM | POA: Diagnosis not present

## 2023-06-22 DIAGNOSIS — N390 Urinary tract infection, site not specified: Secondary | ICD-10-CM | POA: Diagnosis not present

## 2023-06-22 DIAGNOSIS — N2581 Secondary hyperparathyroidism of renal origin: Secondary | ICD-10-CM | POA: Diagnosis not present

## 2023-06-22 DIAGNOSIS — D631 Anemia in chronic kidney disease: Secondary | ICD-10-CM | POA: Diagnosis not present

## 2023-06-22 DIAGNOSIS — I129 Hypertensive chronic kidney disease with stage 1 through stage 4 chronic kidney disease, or unspecified chronic kidney disease: Secondary | ICD-10-CM | POA: Diagnosis not present

## 2023-06-23 ENCOUNTER — Other Ambulatory Visit: Payer: Self-pay | Admitting: Nephrology

## 2023-06-23 DIAGNOSIS — N184 Chronic kidney disease, stage 4 (severe): Secondary | ICD-10-CM

## 2023-06-30 ENCOUNTER — Other Ambulatory Visit: Payer: Self-pay | Admitting: Cardiology

## 2023-06-30 DIAGNOSIS — I5033 Acute on chronic diastolic (congestive) heart failure: Secondary | ICD-10-CM

## 2023-06-30 MED ORDER — EMPAGLIFLOZIN 10 MG PO TABS
10.0000 mg | ORAL_TABLET | Freq: Every day | ORAL | 3 refills | Status: DC
Start: 2023-06-30 — End: 2024-03-08

## 2023-07-07 ENCOUNTER — Other Ambulatory Visit: Payer: Medicare Other

## 2023-07-14 ENCOUNTER — Ambulatory Visit
Admission: RE | Admit: 2023-07-14 | Discharge: 2023-07-14 | Disposition: A | Discharge status: Home / Self Care | Payer: Medicare Other | Source: Ambulatory Visit | Attending: Nephrology | Admitting: Nephrology

## 2023-07-14 DIAGNOSIS — N184 Chronic kidney disease, stage 4 (severe): Secondary | ICD-10-CM

## 2023-07-16 ENCOUNTER — Telehealth: Payer: Self-pay | Admitting: Cardiology

## 2023-07-16 NOTE — Telephone Encounter (Signed)
Pt c/o swelling/edema: STAT if pt has developed SOB within 24 hours   If swelling, where is the swelling located? Legs/feet (Mostly left leg)   How much weight have you gained and in what time span? Haven't checked her weight   Have you gained 2 pounds in a day or 5 pounds in a week? Not sure   Do you have a log of your daily weights (if so, list)? No    Are you currently taking a fluid pill? yes   Are you currently SOB? No    Have you traveled recently in a car or plane for an extended period of time? No    Called and spoke with daughter who states patient has bruised looking area above ankle area but to the side on her left leg. States for the last 2 days the left leg has been discolored, warm and painful at times. Right leg is unchanged. She is compliant with her anticoagulation therapy. Discussed symptoms with Dr Jacinto Halim who states that she should hold her anticogulation therapy for 3 days and then restart as he felt patient may have accidentally bumped leg and has a bruise from that process.   Called daughter Shannon [name] back and informed her that Dr Jacinto Halim wants her to hold her Xarelto for 3 days and then restart. Call back if the bruised area doesn't improve.

## 2023-07-16 NOTE — Telephone Encounter (Signed)
Pt c/o swelling/edema: STAT if pt has developed SOB within 24 hours  If swelling, where is the swelling located? Legs/feet (Mostly left leg)  How much weight have you gained and in what time span? Haven't checked her weight  Have you gained 2 pounds in a day or 5 pounds in a week? Not sure  Do you have a log of your daily weights (if so, list)? No   Are you currently taking a fluid pill? yes  Are you currently SOB? No   Have you traveled recently in a car or plane for an extended period of time? No

## 2023-07-19 NOTE — Telephone Encounter (Signed)
I called patient's daughter Shannon [name], patient had salt rich ox tail dinner about a week ago following which she started developing bilateral leg edema left worse than the right.  She has no watching her salt intake carefully and although leg edema slightly improved still significant edema persist as per daughter.  No worsening dyspnea, no PND or orthopnea.  Advised her to eat increased Demadex to 40 mg in the morning and continue 20 mg in the afternoon for the next 4 to 5 days until leg edema improves and to continue to watch for excess salt intake and to contact us if symptoms persist.

## 2023-07-19 NOTE — Telephone Encounter (Signed)
Pt daughter calling to speak with nurse again about the same issue.

## 2023-07-21 ENCOUNTER — Telehealth: Payer: Self-pay | Admitting: Cardiology

## 2023-07-21 DIAGNOSIS — I5033 Acute on chronic diastolic (congestive) heart failure: Secondary | ICD-10-CM

## 2023-07-21 DIAGNOSIS — R600 Localized edema: Secondary | ICD-10-CM

## 2023-07-21 NOTE — Telephone Encounter (Signed)
 Pt c/o swelling/edema: STAT if pt has developed SOB within 24 hours  If swelling, where is the swelling located?  Left leg  How much weight have you gained and in what time span?  Yes  Have you gained 2 pounds in a day or 5 pounds in a week?   No  Do you have a log of your daily weights (if so, list)?   No  Are you currently taking a fluid pill?   Yes  Are you currently SOB?  Yes  Have you traveled recently in a car or plane for an extended period of time?   Daughter Noemi) stated patient's fluid is still not coming off and patient is still getting out of breath when she's up and moving around.

## 2023-07-21 NOTE — Telephone Encounter (Signed)
 Spoke with Shannon [name] and she states patient still has swelling in her legs.   Per Dr, Ladona she was to increase to Demadex  to 40 mg in the morning and continue 20 in the afternoon.   Shannon [name] stated yesterday was the only day she increased her Demadex  to 40. Advised to continue the increase for the next 4 days and if it has not helped give us  a call back,

## 2023-07-23 NOTE — Telephone Encounter (Signed)
 Discussed w/ MD. (Pt on day 2 of 4 w/ increased dosing) Dtr made aware that Dr. Berry Bristol recommends giving it another 2 days (as instructed already) and calling us  next week if persists. Dtr verbalized understanding and agreeable to plan.

## 2023-07-23 NOTE — Telephone Encounter (Signed)
  Pt's daughter Hardin is calling back, this is the 4th days that pt is taking the extra torsemide . She said pt's leg still swollen and would like to known if Dr. Ladona would like to see her and make an appt or can continue taking extra dose of her torsemide

## 2023-07-26 NOTE — Telephone Encounter (Signed)
 CMP, CBC, BNP. Echocardiogram and OV ASAP with me or anyone.  To add Metolazone  2.5 mg daily for 3 days only. No Refills. Acute on chronic diastolic heart failure

## 2023-07-26 NOTE — Telephone Encounter (Signed)
 Patient's daughter is calling back to give update. States there has been no change in swelling and would like a call back to discuss if an appointment is needed. Please advise.

## 2023-07-26 NOTE — Telephone Encounter (Addendum)
 Spoke with Daughter per DPR and she states she has taken the extra dose of torsemide  has instructed and she feel it has not helped. The patient still has swelling in her legs. Denies any chest pain or SOB.   She would like to know what else do you recommend?

## 2023-07-27 DIAGNOSIS — R600 Localized edema: Secondary | ICD-10-CM | POA: Diagnosis not present

## 2023-07-27 DIAGNOSIS — I5033 Acute on chronic diastolic (congestive) heart failure: Secondary | ICD-10-CM | POA: Diagnosis not present

## 2023-07-27 NOTE — Telephone Encounter (Signed)
Reviewed with Dr Jacinto Halim and he can see patient tomorrow.  Try and get lab work today if possible.  Do not start metolazone at this time.  Will decide about starting at office visit tomorrow. I spoke with patient's daughter and scheduled appointment with Dr Jacinto Halim for 2/12 at 11:20.  Patient will have lab work done at [facility] in [place] today

## 2023-07-27 NOTE — Addendum Note (Signed)
Addended by: Dossie Arbour on: 07/27/2023 09:16 AM   Modules accepted: Orders

## 2023-07-28 ENCOUNTER — Ambulatory Visit: Payer: Medicare Other | Attending: Cardiology | Admitting: Cardiology

## 2023-07-28 ENCOUNTER — Encounter: Payer: Self-pay | Admitting: Cardiology

## 2023-07-28 VITALS — BP 140/70 | HR 73 | Resp 16 | Ht 67.0 in | Wt 222.6 lb

## 2023-07-28 DIAGNOSIS — N1832 Chronic kidney disease, stage 3b: Secondary | ICD-10-CM | POA: Diagnosis not present

## 2023-07-28 DIAGNOSIS — I5033 Acute on chronic diastolic (congestive) heart failure: Secondary | ICD-10-CM

## 2023-07-28 DIAGNOSIS — I10 Essential (primary) hypertension: Secondary | ICD-10-CM

## 2023-07-28 DIAGNOSIS — R600 Localized edema: Secondary | ICD-10-CM | POA: Diagnosis not present

## 2023-07-28 LAB — COMPREHENSIVE METABOLIC PANEL
ALT: 6 IU/L (ref 0–32)
AST: 17 IU/L (ref 0–40)
Albumin: 3.7 g/dL (ref 3.6–4.6)
Alkaline Phosphatase: 122 IU/L — ABNORMAL HIGH (ref 44–121)
BUN/Creatinine Ratio: 16 (ref 12–28)
BUN: 30 mg/dL (ref 10–36)
Bilirubin Total: 0.8 mg/dL (ref 0.0–1.2)
CO2: 23 mmol/L (ref 20–29)
Calcium: 9.2 mg/dL (ref 8.7–10.3)
Chloride: 100 mmol/L (ref 96–106)
Creatinine, Ser: 1.91 mg/dL — ABNORMAL HIGH (ref 0.57–1.00)
Globulin, Total: 3.5 g/dL (ref 1.5–4.5)
Glucose: 96 mg/dL (ref 70–99)
Potassium: 4.5 mmol/L (ref 3.5–5.2)
Sodium: 142 mmol/L (ref 134–144)
Total Protein: 7.2 g/dL (ref 6.0–8.5)
eGFR: 25 mL/min/{1.73_m2} — ABNORMAL LOW (ref >59)

## 2023-07-28 LAB — CBC
Hematocrit: 41.9 % (ref 34.0–46.6)
Hemoglobin: 14 g/dL (ref 11.1–15.9)
MCH: 32.5 pg (ref 26.6–33.0)
MCHC: 33.4 g/dL (ref 31.5–35.7)
MCV: 97 fL (ref 79–97)
Platelets: 256 10*3/uL (ref 150–450)
RBC: 4.31 x10E6/uL (ref 3.77–5.28)
RDW: 12.7 % (ref 11.7–15.4)
WBC: 7.6 10*3/uL (ref 3.4–10.8)

## 2023-07-28 LAB — PRO B NATRIURETIC PEPTIDE: NT-Pro BNP: 1463 pg/mL — ABNORMAL HIGH (ref 0–738)

## 2023-07-28 MED ORDER — METOLAZONE 5 MG PO TABS: 5.0000 mg | ORAL_TABLET | Freq: Every day | ORAL | 0 refills | Status: DC

## 2023-07-28 NOTE — Patient Instructions (Signed)
Medication Instructions:  Your physician has recommended you make the following change in your medication: Start metolazone 5 mg by mouth daily for one week  *If you need a refill on your cardiac medications before your next appointment, please call your pharmacy*   Lab Work: none If you have labs (blood work) drawn today and your tests are completely normal, you will receive your results only by: MyChart Message (if you have MyChart) OR A paper copy in the mail If you have any lab test that is abnormal or we need to change your treatment, we will call you to review the results.   Testing/Procedures: Your physician has requested that you have an echocardiogram. Echocardiography is a painless test that uses sound waves to create images of your heart. It provides your doctor with information about the size and shape of your heart and how well your heart's chambers and valves are working. This procedure takes approximately one hour. There are no restrictions for this procedure. Please do NOT wear cologne, perfume, aftershave, or lotions (deodorant is allowed). Please arrive 15 minutes prior to your appointment time.  Please note: We ask at that you not bring children with you during ultrasound (echo/ vascular) testing. Due to room size and safety concerns, children are not allowed in the ultrasound rooms during exams. Our front office staff cannot provide observation of children in our lobby area while testing is being conducted. An adult accompanying a patient to their appointment will only be allowed in the ultrasound room at the discretion of the ultrasound technician under special circumstances. We apologize for any inconvenience.    Follow-Up: At [facility], you and your health needs are our priority.  As part of our continuing mission to provide you with exceptional heart care, we have created designated Provider Care Teams.  These Care Teams include your primary Cardiologist  (physician) and Advanced Practice Providers (APPs -  Physician Assistants and Nurse Practitioners) who all work together to provide you with the care you need, when you need it.  We recommend signing up for the patient portal called "MyChart".  Sign up information is provided on this After Visit Summary.  MyChart is used to connect with patients for Virtual Visits (Telemedicine).  Patients are able to view lab/test results, encounter notes, upcoming appointments, etc.  Non-urgent messages can be sent to your provider as well.   To learn more about what you can do with MyChart, go to [email or web address].    Your next appointment:   2/20 at 11:40  Provider:   Dr Jacinto Halim     Other Instructions  Keep legs elevated

## 2023-07-28 NOTE — Progress Notes (Signed)
Cardiology Office Note:  .   Date:  07/28/2023  ID:  [name], DOB 04-01-1931, MRN [id number] PCP: Georgianne Fick, MD  [facility] Providers Cardiologist:  None   History of Present Illness: .   Shannon [name] is a 89 y.o.  AA female with permanent A. Fib, hypertension (No RAS by angio in 2016), stage IIIa-b chronic kidney disease, hyperlipidemia, obesity, frequent PVCs by electrocardiogram and history of gout.  She was seen by me on 06/12/2023 and had been doing relatively well.  We had discussed regarding heart failure management.  Patient had a family get together and had oily and salty food including ox tail and since then 3 to 4 days later started noticing worsening leg edema and patient's daughter had called [place] several times and rate increased her diuretics.  Patient did not notice any significant change in edema.  Hence made arrangements to be seen on a urgent basis in the office.  In spite of leg edema, left leg worse than the right, patient herself denies any orthopnea, PND, worsening dyspnea.  Her activity is overall still limited.  No chest pain or palpitations.  She has not had any bleeding diathesis from Xarelto.  Otherwise no changes in symptoms.  She is taking all her medications as prescribed.  She is accompanied by her daughter.  Discussed the use of AI scribe software for clinical note transcription with the patient, who gave verbal consent to proceed.  Labs   Lab Results  Component Value Date   TRIG 91 07/07/2020   Lab Results  Component Value Date   NA 142 07/27/2023   K 4.5 07/27/2023   CO2 23 07/27/2023   GLUCOSE 96 07/27/2023   BUN 30 07/27/2023   CREATININE 1.91 (H) 07/27/2023   CALCIUM 9.2 07/27/2023   EGFR 25 (L) 07/27/2023   GFRNONAA 42 (L) 11/14/2021      Latest Ref Rng & Units 07/27/2023    3:11 PM 04/24/2022    3:16 PM 04/06/2022    1:09 PM  BMP  Glucose 70 - 99 mg/dL 96  81  96   BUN 10 - 36 mg/dL 30  33  23    Creatinine 0.57 - 1.00 mg/dL 4.09  8.11  9.14   BUN/Creat Ratio 12 - 28 16  23  16    Sodium 134 - 144 mmol/L 142  140  141   Potassium 3.5 - 5.2 mmol/L 4.5  4.6  3.7   Chloride 96 - 106 mmol/L 100  100  100   CO2 20 - 29 mmol/L 23  29  24    Calcium 8.7 - 10.3 mg/dL 9.2  8.9  9.2       Latest Ref Rng & Units 07/27/2023    3:11 PM 11/14/2021    7:00 PM 07/11/2020   12:53 AM  CBC  WBC 3.4 - 10.8 x10E3/uL 7.6  6.5  9.1   Hemoglobin 11.1 - 15.9 g/dL 78.2  95.6  21.3   Hematocrit 34.0 - 46.6 % 41.9  40.0  37.9   Platelets 150 - 450 x10E3/uL 256  186  281    Lab Results  Component Value Date   HGBA1C 5.5 09/24/2017    Lab Results  Component Value Date   TSH 4.464 09/24/2017    ProBNP (last 3 results) Recent Labs    07/27/23 1511  PROBNP 1,463*   Review of Systems  Cardiovascular:  Positive for leg swelling (left > right). Negative for chest  pain and dyspnea on exertion.   Physical Exam:   VS:  BP (!) 140/70 (BP Location: Right Arm, Patient Position: Sitting, Cuff Size: Large)   Pulse 73   Resp 16   Ht 5\' 7"  (1.702 m)   Wt 222 lb 9.6 oz (101 kg)   SpO2 95%   BMI 34.86 kg/m    Wt Readings from Last 3 Encounters:  07/28/23 222 lb 9.6 oz (101 kg)  06/11/23 227 lb 3.2 oz (103.1 kg)  10/12/22 211 lb 12.8 oz (96.1 kg)    Physical Exam Neck:     Vascular: No carotid bruit or JVD.  Cardiovascular:     Rate and Rhythm: Normal rate. Rhythm irregular.     Pulses: Normal pulses and intact distal pulses.     Heart sounds: No murmur heard. Pulmonary:     Effort: Pulmonary effort is normal.     Breath sounds: Normal breath sounds.  Abdominal:     General: Bowel sounds are normal.     Palpations: Abdomen is soft.  Musculoskeletal:     Right lower leg: Edema (2+ pitting) present.     Left lower leg: Edema (3+ pitting above knee) present.  Skin:    Capillary Refill: Capillary refill takes less than 2 seconds.    Studies Reviewed: Marland Kitchen     EKG:    EKG  Interpretation Date/Time:  Wednesday July 28 2023 11:54:37 EST Ventricular Rate:  70 PR Interval:    QRS Duration:  80 QT Interval:  406 QTC Calculation: 438 R Axis:   -5  Text Interpretation: EKG 07/28/2023: Atrial fibrillation with controlled ventricular response at the rate of 70 bpm, normal axis.  Anterolateral infarct old.  Low-voltage complexes.  Compared to 06/11/2023, no change. Reconfirmed by Delrae Rend [phone]) on 07/28/2023 12:44:52 PM    Medications and allergies    Allergies  Allergen Reactions   Amlodipine Swelling    Intolerance, LE edema    Zestoretic [Lisinopril-Hydrochlorothiazide] Itching and Rash    Current Outpatient Medications:    empagliflozin (JARDIANCE) 10 MG TABS tablet, Take 1 tablet (10 mg total) by mouth daily., Disp: 90 tablet, Rfl: 3   hydrALAZINE (APRESOLINE) 100 MG tablet, Take 100 mg by mouth 2 (two) times daily., Disp: , Rfl:    isosorbide dinitrate (ISORDIL) 30 MG tablet, TAKE 1 TABLET(30 MG) BY MOUTH THREE TIMES DAILY, Disp: 90 tablet, Rfl: 2   metolazone (ZAROXOLYN) 5 MG tablet, Take 1 tablet (5 mg total) by mouth daily., Disp: 15 tablet, Rfl: 0   pindolol (VISKEN) 5 MG tablet, TAKE 1 TABLET(5 MG) BY MOUTH TWICE DAILY, Disp: 180 tablet, Rfl: 1   Potassium Chloride ER 20 MEQ TBCR, Take 1 tablet by mouth daily., Disp: , Rfl:    torsemide (DEMADEX) 20 MG tablet, Take 1 tablet (20 mg total) by mouth 2 (two) times daily at 10 am and 4 pm., Disp: 180 tablet, Rfl: 3   XARELTO 15 MG TABS tablet, TAKE 1 TABLET BY MOUTH EVERY EVENING AFTER DINNER, Disp: 90 tablet, Rfl: 0   ASSESSMENT AND PLAN: .      ICD-10-CM   1. Acute on chronic diastolic heart failure (HCC)  U04.54 EKG 12-Lead    ECHOCARDIOGRAM COMPLETE    2. Primary hypertension  I10     3. Bilateral leg edema  R60.0 ECHOCARDIOGRAM COMPLETE    4. Stage 3b chronic kidney disease (HCC)  N18.32      Assessment and Plan    Heart Failure Significant leg  edema and an 11-pound weight  gain are present. Torasemide has not effectively increased urine output. There is no evidence of injury or bleeding into the legs. Swelling is attributed to fluid retention, likely exacerbated by high-sodium dietary indiscretions.  Emphasize the importance of achieving a negative fluid balance and bed rest with leg elevation to reduce swelling.  Discuss the risks of worsening renal function with increased diuretic use and the need for close monitoring.  Failure to adhere to dietary and fluid restrictions could result in hospitalization. Prescribe metolazone 5 mg once daily for one week. Advise complete bed rest with legs elevated for three days. Instruct to avoid high-sodium foods and outside food for four weeks. Schedule a follow-up appointment in one week. Instructions were also given to the patient to stop metolazone if edema improves.  Primary hypertension Blood pressure is slightly elevated at 140/70 mmHg, will let patient have relative hypotension in view of worsening renal function with diuretic use.  She does have underlying stage IIIb chronic kidney disease.  Atrial Fibrillation Atrial fibrillation with a controlled ventricular response is noted. Recent EKG shows atrial fibrillation with a ventricular rate of 70 bpm, normal axis, anterolateral infarct (old), and low voltage complexes, with no change compared to the previous EKG from June 11, 2023. Continue current management of atrial fibrillation.      Signed,  Yates Decamp, MD, [facility] 07/28/2023, 6:40 PM [facility] [address] [place], [place] [address] Phone: [phone]. Fax:  [phone]

## 2023-08-04 NOTE — Progress Notes (Deleted)
 Cardiology Office Note:  .   Date:  08/04/2023  ID:  [name], DOB [birth year], MRN [id number] PCP: Georgianne Fick, MD  [facility] Providers Cardiologist:  None { Click to update primary MD,subspecialty MD or APP then REFRESH:1}  History of Present Illness: .   Analyah Eithel [name] is a 89 y.o. AA female with permanent A. Fib, hypertension (No RAS by angio in 2016), stage IIIa-b chronic kidney disease, hyperlipidemia, obesity, frequent PVCs by electrocardiogram and history of gout.  She was seen by me on 07/28/2023 with acute decompensated heart failure after having had poor diet, had worsening leg edema and worsening dyspnea on exertion.  Started on short course of metolazone, extensive discussion regarding diet and bedrest to keep her legs elevated.  She now presents for follow-up.  Echocardiogram is pending.  Discussed the use of AI scribe software for clinical note transcription with the patient, who gave verbal consent to proceed.  History of Present Illness            Labs   Lab Results  Component Value Date   TRIG 91 07/07/2020   Lab Results  Component Value Date   NA 142 07/27/2023   K 4.5 07/27/2023   CO2 23 07/27/2023   GLUCOSE 96 07/27/2023   BUN 30 07/27/2023   CREATININE 1.91 (H) 07/27/2023   CALCIUM 9.2 07/27/2023   EGFR 25 (L) 07/27/2023   GFRNONAA 42 (L) 11/14/2021      Latest Ref Rng & Units 07/27/2023    3:11 PM 04/24/2022    3:16 PM 04/06/2022    1:09 PM  BMP  Glucose 70 - 99 mg/dL 96  81  96   BUN 10 - 36 mg/dL 30  33  23   Creatinine 0.57 - 1.00 mg/dL 5.62  1.30  8.65   BUN/Creat Ratio 12 - 28 16  23  16    Sodium 134 - 144 mmol/L 142  140  141   Potassium 3.5 - 5.2 mmol/L 4.5  4.6  3.7   Chloride 96 - 106 mmol/L 100  100  100   CO2 20 - 29 mmol/L 23  29  24    Calcium 8.7 - 10.3 mg/dL 9.2  8.9  9.2       Latest Ref Rng & Units 07/27/2023    3:11 PM 11/14/2021    7:00 PM 07/11/2020   12:53 AM  CBC  WBC 3.4 - 10.8 x10E3/uL 7.6   6.5  9.1   Hemoglobin 11.1 - 15.9 g/dL 78.4  69.6  29.5   Hematocrit 34.0 - 46.6 % 41.9  40.0  37.9   Platelets 150 - 450 x10E3/uL 256  186  281    Lab Results  Component Value Date   HGBA1C 5.5 09/24/2017    Lab Results  Component Value Date   TSH 4.464 09/24/2017    ProBNP (last 3 results) Recent Labs    07/27/23 1511  PROBNP 1,463*   ***ROS Physical Exam:   VS:  There were no vitals taken for this visit.   Wt Readings from Last 3 Encounters:  07/28/23 222 lb 9.6 oz (101 kg)  06/11/23 227 lb 3.2 oz (103.1 kg)  10/12/22 211 lb 12.8 oz (96.1 kg)    ***Physical Exam Studies Reviewed: .    *** EKG:         EKG 07/28/2023: Atrial fibrillation with controlled ventricular response at the rate of 70 bpm, normal axis. Anterolateral infarct old. Low-voltage complexes. Compared  to 06/11/2023, no change   Medications and allergies    Allergies  Allergen Reactions   Amlodipine Swelling    Intolerance, LE edema    Zestoretic [Lisinopril-Hydrochlorothiazide] Itching and Rash     Current Outpatient Medications:    empagliflozin (JARDIANCE) 10 MG TABS tablet, Take 1 tablet (10 mg total) by mouth daily., Disp: 90 tablet, Rfl: 3   hydrALAZINE (APRESOLINE) 100 MG tablet, Take 100 mg by mouth 2 (two) times daily., Disp: , Rfl:    isosorbide dinitrate (ISORDIL) 30 MG tablet, TAKE 1 TABLET(30 MG) BY MOUTH THREE TIMES DAILY, Disp: 90 tablet, Rfl: 2   metolazone (ZAROXOLYN) 5 MG tablet, Take 1 tablet (5 mg total) by mouth daily., Disp: 15 tablet, Rfl: 0   pindolol (VISKEN) 5 MG tablet, TAKE 1 TABLET(5 MG) BY MOUTH TWICE DAILY, Disp: 180 tablet, Rfl: 1   Potassium Chloride ER 20 MEQ TBCR, Take 1 tablet by mouth daily., Disp: , Rfl:    torsemide (DEMADEX) 20 MG tablet, Take 1 tablet (20 mg total) by mouth 2 (two) times daily at 10 am and 4 pm., Disp: 180 tablet, Rfl: 3   XARELTO 15 MG TABS tablet, TAKE 1 TABLET BY MOUTH EVERY EVENING AFTER DINNER, Disp: 90 tablet, Rfl: 0   ASSESSMENT  AND PLAN: .      ICD-10-CM   1. Acute on chronic diastolic heart failure (HCC)  Z61.09     2. Bilateral leg edema  R60.0     3. Stage 3b chronic kidney disease (HCC)  N18.32     4. Permanent atrial fibrillation (HCC)  I48.21       1. Acute on chronic diastolic heart failure (HCC) ***  2. Bilateral leg edema ***  3. Stage 3b chronic kidney disease (HCC) ***  4. Permanent atrial fibrillation [facility]) ***  Assessment and Plan                Signed,  Yates Decamp, MD, [facility] 08/04/2023, 9:27 PM [facility] [address] [place], [place] [address] Phone: [phone]. Fax:  [phone]

## 2023-08-05 ENCOUNTER — Ambulatory Visit: Payer: Medicare Other | Admitting: Cardiology

## 2023-08-05 DIAGNOSIS — I4821 Permanent atrial fibrillation: Secondary | ICD-10-CM

## 2023-08-05 DIAGNOSIS — I5033 Acute on chronic diastolic (congestive) heart failure: Secondary | ICD-10-CM

## 2023-08-05 DIAGNOSIS — R600 Localized edema: Secondary | ICD-10-CM

## 2023-08-05 DIAGNOSIS — N1832 Chronic kidney disease, stage 3b: Secondary | ICD-10-CM

## 2023-08-06 ENCOUNTER — Encounter: Payer: Self-pay | Admitting: Cardiology

## 2023-08-06 ENCOUNTER — Ambulatory Visit: Payer: Medicare Other | Attending: Cardiology | Admitting: Cardiology

## 2023-08-06 VITALS — BP 122/62 | HR 74 | Resp 16 | Ht 67.0 in | Wt 224.8 lb

## 2023-08-06 DIAGNOSIS — I10 Essential (primary) hypertension: Secondary | ICD-10-CM | POA: Diagnosis not present

## 2023-08-06 DIAGNOSIS — I4821 Permanent atrial fibrillation: Secondary | ICD-10-CM | POA: Diagnosis not present

## 2023-08-06 DIAGNOSIS — I5033 Acute on chronic diastolic (congestive) heart failure: Secondary | ICD-10-CM | POA: Diagnosis not present

## 2023-08-06 NOTE — Progress Notes (Signed)
Cardiology Office Note:  .   Date:  08/06/2023  ID:  Shannon [name], DOB 1931-04-29, MRN [id number] PCP: Georgianne Fick, MD  [facility] Providers Cardiologist:  Yates Decamp, MD   History of Present Illness: .   Shannon [name] is a 89 y.o. AA female with permanent A. Fib, hypertension (No RAS by angio in 2016), stage IIIa-b chronic kidney disease, hyperlipidemia, obesity, frequent PVCs by electrocardiogram and history of gout.  She was seen by me on 07/28/2023 with acute decompensated heart failure after having had poor diet, had worsening leg edema and worsening dyspnea on exertion.  Started on short course of metolazone, extensive discussion regarding diet and bedrest to keep her legs elevated.  She now presents for follow-up.  Echocardiogram is pending.  Discussed the use of AI scribe software for clinical note transcription with the patient, who gave verbal consent to proceed.  History of Present Illness   Shannon [name], a patient with a history of heart failure, presents for a follow-up visit after a recent episode of fluid overload. The patient reports significant improvement in symptoms, including a reduction in leg swelling, following a period of bed rest and the use of a diuretic medication. However, there is still some residual swelling in one leg. The patient has been adhering to the recommended bed rest and has been taking the prescribed water pill. Despite the improvement, the patient has experienced some difficulty with dietary restrictions, particularly with limiting fluid intake. The patient's caregiver has been actively involved in managing the patient's diet and medication regimen.       Labs   Lab Results  Component Value Date   TRIG 91 07/07/2020   Lab Results  Component Value Date   NA 142 07/27/2023   K 4.5 07/27/2023   CO2 23 07/27/2023   GLUCOSE 96 07/27/2023   BUN 30 07/27/2023   CREATININE 1.91 (H) 07/27/2023   CALCIUM 9.2 07/27/2023   EGFR 25 (L)  07/27/2023   GFRNONAA 42 (L) 11/14/2021      Latest Ref Rng & Units 07/27/2023    3:11 PM 04/24/2022    3:16 PM 04/06/2022    1:09 PM  BMP  Glucose 70 - 99 mg/dL 96  81  96   BUN 10 - 36 mg/dL 30  33  23   Creatinine 0.57 - 1.00 mg/dL 3.66  4.40  3.47   BUN/Creat Ratio 12 - 28 16  23  16    Sodium 134 - 144 mmol/L 142  140  141   Potassium 3.5 - 5.2 mmol/L 4.5  4.6  3.7   Chloride 96 - 106 mmol/L 100  100  100   CO2 20 - 29 mmol/L 23  29  24    Calcium 8.7 - 10.3 mg/dL 9.2  8.9  9.2       Latest Ref Rng & Units 07/27/2023    3:11 PM 11/14/2021    7:00 PM 07/11/2020   12:53 AM  CBC  WBC 3.4 - 10.8 x10E3/uL 7.6  6.5  9.1   Hemoglobin 11.1 - 15.9 g/dL 42.5  95.6  38.7   Hematocrit 34.0 - 46.6 % 41.9  40.0  37.9   Platelets 150 - 450 x10E3/uL 256  186  281    Lab Results  Component Value Date   HGBA1C 5.5 09/24/2017    Lab Results  Component Value Date   TSH 4.464 09/24/2017    ProBNP (last 3 results) Recent Labs    07/27/23  1511  PROBNP 1,463*   Review of Systems  Cardiovascular:  Positive for leg swelling (Improved and resolved right and mild left leg). Negative for chest pain and dyspnea on exertion.   Physical Exam:   VS:  BP 122/62 (BP Location: Left Arm, Patient Position: Sitting, Cuff Size: Normal)   Pulse 74   Resp 16   Ht 5\' 7"  (1.702 m)   Wt 224 lb 12.8 oz (102 kg)   SpO2 98%   BMI 35.21 kg/m    Wt Readings from Last 3 Encounters:  08/06/23 224 lb 12.8 oz (102 kg)  07/28/23 222 lb 9.6 oz (101 kg)  06/11/23 227 lb 3.2 oz (103.1 kg)   Physical Exam Neck:     Vascular: No carotid bruit or JVD.  Cardiovascular:     Rate and Rhythm: Normal rate. Rhythm irregular.     Pulses: Normal pulses and intact distal pulses.     Heart sounds: No murmur heard. Pulmonary:     Effort: Pulmonary effort is normal.     Breath sounds: Normal breath sounds.  Abdominal:     General: Bowel sounds are normal.     Palpations: Abdomen is soft.  Musculoskeletal:     Right  lower leg: No edema.     Left lower leg: Edema (2+ Pitting below knee edema) present.  Skin:    Capillary Refill: Capillary refill takes less than 2 seconds.    Studies Reviewed: .    Echo pending  EKG:        EKG 07/28/2023: Atrial fibrillation with controlled ventricular response at the rate of 70 bpm, normal axis. Anterolateral infarct old. Low-voltage complexes. Compared to 06/11/2023, no change   Medications and allergies    Allergies  Allergen Reactions   Amlodipine Swelling    Intolerance, LE edema    Zestoretic [Lisinopril-Hydrochlorothiazide] Itching and Rash    Current Outpatient Medications:    empagliflozin (JARDIANCE) 10 MG TABS tablet, Take 1 tablet (10 mg total) by mouth daily., Disp: 90 tablet, Rfl: 3   hydrALAZINE (APRESOLINE) 100 MG tablet, Take 100 mg by mouth 3 (three) times daily., Disp: , Rfl:    isosorbide dinitrate (ISORDIL) 30 MG tablet, TAKE 1 TABLET(30 MG) BY MOUTH THREE TIMES DAILY, Disp: 90 tablet, Rfl: 2   pindolol (VISKEN) 5 MG tablet, TAKE 1 TABLET(5 MG) BY MOUTH TWICE DAILY, Disp: 180 tablet, Rfl: 1   Potassium Chloride ER 20 MEQ TBCR, Take 1 tablet by mouth daily., Disp: , Rfl:    torsemide (DEMADEX) 20 MG tablet, Take 1 tablet (20 mg total) by mouth 2 (two) times daily at 10 am and 4 pm., Disp: 180 tablet, Rfl: 3   XARELTO 15 MG TABS tablet, TAKE 1 TABLET BY MOUTH EVERY EVENING AFTER DINNER, Disp: 90 tablet, Rfl: 0   ASSESSMENT AND PLAN: .      ICD-10-CM   1. Acute on chronic diastolic heart failure (HCC)  W09.81     2. Primary hypertension  I10     3. Permanent atrial fibrillation (HCC)  I48.21       Assessment and Plan    Heart Failure The recent exacerbation of heart failure has resolved. Lungs are clear and leg swelling has subsided.  Explained to the patient and her daughter that cardio hormones will still remain high and she will have high risk of recurrence of heart failure if she is not careful and will take up to 4 to 6 weeks  to normalize.   Small  meal portions were discussed to prevent heart overload, and elevating feet was advised for fluid management. Metolazone (Zaroxolyn) 5 mg which was used for a week was discontinued avoid renal stress. Continue current medications. Monitor for leg swelling and contact the office if it occurs. Follow-up is scheduled in six months.  Blood pressure is very well-controlled.  Presently tolerating hydralazine, pindolol and torsemide.  Patient has a chronic kidney disease stage IIIb, continue Demadex 20 mg twice daily.  With regard to permanent atrial fibrillation, she is on anticoagulation and is tolerating this well without bleeding diathesis.  Her heart rate is controlled on physical examination.  Office visit in 6 months.  Echocardiogram is pending.   Signed,  Yates Decamp, MD, [facility] 08/06/2023, 4:48 PM [facility] [address] [place], [place] [address] Phone: [phone]. Fax:  [phone]

## 2023-08-06 NOTE — Patient Instructions (Signed)
Medication Instructions:  Your physician recommends that you continue on your current medications as directed. Please refer to the Current Medication list given to you today.  *If you need a refill on your cardiac medications before your next appointment, please call your pharmacy*   Lab Work: NONE  If you have labs (blood work) drawn today and your tests are completely normal, you will receive your results only by: MyChart Message (if you have MyChart) OR A paper copy in the mail If you have any lab test that is abnormal or we need to change your treatment, we will call you to review the results.   Testing/Procedures: NONE   Follow-Up: At [facility], you and your health needs are our priority.  As part of our continuing mission to provide you with exceptional heart care, we have created designated Provider Care Teams.  These Care Teams include your primary Cardiologist (physician) and Advanced Practice Providers (APPs -  Physician Assistants and Nurse Practitioners) who all work together to provide you with the care you need, when you need it.  We recommend signing up for the patient portal called "MyChart".  Sign up information is provided on this After Visit Summary.  MyChart is used to connect with patients for Virtual Visits (Telemedicine).  Patients are able to view lab/test results, encounter notes, upcoming appointments, etc.  Non-urgent messages can be sent to your provider as well.   To learn more about what you can do with MyChart, go to [email or web address].    Your next appointment:   6 month(s)  Provider:   Yates Decamp, MD       1st Floor: - Lobby - Registration  - Pharmacy  - Lab - Cafe  2nd Floor: - PV Lab - Diagnostic Testing (echo, CT, nuclear med)  3rd Floor: - Vacant  4th Floor: - TCTS (cardiothoracic surgery) - AFib Clinic - Structural Heart Clinic - Vascular Surgery  - Vascular Ultrasound  5th Floor: - HeartCare Cardiology  (general and EP) - Clinical Pharmacy for coumadin, hypertension, lipid, weight-loss medications, and med management appointments    Valet parking services will be available as well.

## 2023-08-16 DIAGNOSIS — N184 Chronic kidney disease, stage 4 (severe): Secondary | ICD-10-CM | POA: Diagnosis not present

## 2023-08-16 DIAGNOSIS — N390 Urinary tract infection, site not specified: Secondary | ICD-10-CM | POA: Diagnosis not present

## 2023-08-19 ENCOUNTER — Ambulatory Visit (HOSPITAL_COMMUNITY): Payer: Medicare Other | Attending: Internal Medicine

## 2023-08-19 DIAGNOSIS — R600 Localized edema: Secondary | ICD-10-CM | POA: Diagnosis not present

## 2023-08-19 DIAGNOSIS — I5033 Acute on chronic diastolic (congestive) heart failure: Secondary | ICD-10-CM | POA: Diagnosis not present

## 2023-08-19 LAB — ECHOCARDIOGRAM COMPLETE
Area-P 1/2: 5.52 cm2
S' Lateral: 3.2 cm

## 2023-08-22 NOTE — Progress Notes (Signed)
 Heart function is normal, Minor valve abnormality. NO change in overall test from 03/31/2022

## 2023-08-26 DIAGNOSIS — N2581 Secondary hyperparathyroidism of renal origin: Secondary | ICD-10-CM | POA: Diagnosis not present

## 2023-08-26 DIAGNOSIS — I129 Hypertensive chronic kidney disease with stage 1 through stage 4 chronic kidney disease, or unspecified chronic kidney disease: Secondary | ICD-10-CM | POA: Diagnosis not present

## 2023-08-26 DIAGNOSIS — D631 Anemia in chronic kidney disease: Secondary | ICD-10-CM | POA: Diagnosis not present

## 2023-08-26 DIAGNOSIS — N1832 Chronic kidney disease, stage 3b: Secondary | ICD-10-CM | POA: Diagnosis not present

## 2023-08-26 DIAGNOSIS — N189 Chronic kidney disease, unspecified: Secondary | ICD-10-CM | POA: Diagnosis not present

## 2023-09-01 ENCOUNTER — Encounter (HOSPITAL_COMMUNITY): Payer: Self-pay | Admitting: *Deleted

## 2023-09-01 ENCOUNTER — Emergency Department (HOSPITAL_COMMUNITY)
Admission: EM | Admit: 2023-09-01 | Discharge: 2023-09-02 | Disposition: A | Discharge status: Home / Self Care | Attending: Emergency Medicine | Admitting: Emergency Medicine

## 2023-09-01 ENCOUNTER — Other Ambulatory Visit: Payer: Self-pay

## 2023-09-01 DIAGNOSIS — I4891 Unspecified atrial fibrillation: Secondary | ICD-10-CM | POA: Diagnosis not present

## 2023-09-01 DIAGNOSIS — K7460 Unspecified cirrhosis of liver: Secondary | ICD-10-CM | POA: Diagnosis not present

## 2023-09-01 DIAGNOSIS — Z7901 Long term (current) use of anticoagulants: Secondary | ICD-10-CM | POA: Insufficient documentation

## 2023-09-01 DIAGNOSIS — N390 Urinary tract infection, site not specified: Secondary | ICD-10-CM | POA: Diagnosis not present

## 2023-09-01 DIAGNOSIS — I5032 Chronic diastolic (congestive) heart failure: Secondary | ICD-10-CM | POA: Diagnosis not present

## 2023-09-01 DIAGNOSIS — N939 Abnormal uterine and vaginal bleeding, unspecified: Secondary | ICD-10-CM | POA: Diagnosis present

## 2023-09-01 DIAGNOSIS — R109 Unspecified abdominal pain: Secondary | ICD-10-CM | POA: Diagnosis not present

## 2023-09-01 DIAGNOSIS — R310 Gross hematuria: Secondary | ICD-10-CM | POA: Diagnosis not present

## 2023-09-01 DIAGNOSIS — Z79899 Other long term (current) drug therapy: Secondary | ICD-10-CM | POA: Diagnosis not present

## 2023-09-01 DIAGNOSIS — I130 Hypertensive heart and chronic kidney disease with heart failure and stage 1 through stage 4 chronic kidney disease, or unspecified chronic kidney disease: Secondary | ICD-10-CM | POA: Diagnosis not present

## 2023-09-01 DIAGNOSIS — N189 Chronic kidney disease, unspecified: Secondary | ICD-10-CM | POA: Insufficient documentation

## 2023-09-01 DIAGNOSIS — K7689 Other specified diseases of liver: Secondary | ICD-10-CM | POA: Diagnosis not present

## 2023-09-01 LAB — COMPREHENSIVE METABOLIC PANEL
ALT: 10 U/L (ref 0–44)
AST: 17 U/L (ref 15–41)
Albumin: 3.1 g/dL — ABNORMAL LOW (ref 3.5–5.0)
Alkaline Phosphatase: 78 U/L (ref 38–126)
Anion gap: 11 (ref 5–15)
BUN: 22 mg/dL (ref 8–23)
CO2: 23 mmol/L (ref 22–32)
Calcium: 8.6 mg/dL — ABNORMAL LOW (ref 8.9–10.3)
Chloride: 106 mmol/L (ref 98–111)
Creatinine, Ser: 1.54 mg/dL — ABNORMAL HIGH (ref 0.44–1.00)
GFR, Estimated: 32 mL/min — ABNORMAL LOW (ref >60)
Glucose, Bld: 114 mg/dL — ABNORMAL HIGH (ref 70–99)
Potassium: 3.8 mmol/L (ref 3.5–5.1)
Sodium: 140 mmol/L (ref 135–145)
Total Bilirubin: 1.1 mg/dL (ref 0.0–1.2)
Total Protein: 7.2 g/dL (ref 6.5–8.1)

## 2023-09-01 LAB — URINALYSIS, ROUTINE W REFLEX MICROSCOPIC
Bilirubin Urine: NEGATIVE
Glucose, UA: >=500 mg/dL — AB
Ketones, ur: NEGATIVE mg/dL
Nitrite: NEGATIVE
Protein, ur: NEGATIVE mg/dL
Specific Gravity, Urine: 1.003 — ABNORMAL LOW (ref 1.005–1.030)
WBC, UA: >50 WBC/hpf (ref 0–5)
pH: 6 (ref 5.0–8.0)

## 2023-09-01 LAB — CBC
HCT: 39.4 % (ref 36.0–46.0)
Hemoglobin: 12.8 g/dL (ref 12.0–15.0)
MCH: 32.6 pg (ref 26.0–34.0)
MCHC: 32.5 g/dL (ref 30.0–36.0)
MCV: 100.3 fL — ABNORMAL HIGH (ref 80.0–100.0)
Platelets: 237 10*3/uL (ref 150–400)
RBC: 3.93 MIL/uL (ref 3.87–5.11)
RDW: 15 % (ref 11.5–15.5)
WBC: 7.2 10*3/uL (ref 4.0–10.5)
nRBC: 0 % (ref 0.0–0.2)

## 2023-09-01 LAB — PROTIME-INR
INR: 2.2 — ABNORMAL HIGH (ref 0.8–1.2)
Prothrombin Time: 24.9 s — ABNORMAL HIGH (ref 11.4–15.2)

## 2023-09-01 LAB — SAMPLE TO BLOOD BANK

## 2023-09-01 NOTE — ED Provider Triage Note (Signed)
 Emergency Medicine Provider Triage Evaluation Note  Daliah Shameeka Silliman , a 89 y.o. female  was evaluated in triage.  Pt complains of vaginal bleeding. She states that she began experiencing vaginal bleeding today with "flow" noted. No history of recent vaginal bleeding. No history of cancer. Not on blood thinners. Denies any abdominal or pelvic pain. No urinary discomfort.  Review of Systems  Positive: As above Negative: As above  Physical Exam  BP (!) 146/70 (BP Location: Right Arm)   Pulse 67   Temp 98.4 F (36.9 C)   Resp 15   Ht 5\' 7"  (1.702 m)   Wt 102 kg   SpO2 96%   BMI 35.22 kg/m  Gen:   Awake, no distress   Resp:  Normal effort  MSK:   Moves extremities without difficulty  Other:  Abdomen is soft and nontender. No guarding or palpable mass appreciated.  Medical Decision Making  Medically screening exam initiated at 10:49 PM.  Appropriate orders placed.  Andalyn Zoie Sarin was informed that the remainder of the evaluation will be completed by another provider, this initial triage assessment does not replace that evaluation, and the importance of remaining in the ED until their evaluation is complete.  Given nature of complaint, pelvic exam deferred at this time but advised patient this may be needed.   Smitty Knudsen, PA-C 09/01/23 2249

## 2023-09-01 NOTE — ED Triage Notes (Signed)
 The pt is c/o vaginal bleeding for 2 days no pain no previous history

## 2023-09-02 ENCOUNTER — Emergency Department (HOSPITAL_COMMUNITY)

## 2023-09-02 DIAGNOSIS — R109 Unspecified abdominal pain: Secondary | ICD-10-CM | POA: Diagnosis not present

## 2023-09-02 DIAGNOSIS — K7460 Unspecified cirrhosis of liver: Secondary | ICD-10-CM | POA: Diagnosis not present

## 2023-09-02 DIAGNOSIS — K7689 Other specified diseases of liver: Secondary | ICD-10-CM | POA: Diagnosis not present

## 2023-09-02 MED ORDER — CEPHALEXIN 500 MG PO CAPS: 500.0000 mg | ORAL_CAPSULE | Freq: Three times a day (TID) | ORAL | 0 refills | Status: AC

## 2023-09-02 MED ORDER — IOHEXOL 350 MG/ML SOLN
75.0000 mL | Freq: Once | INTRAVENOUS | Status: AC | PRN
  Administered 2023-09-02: 75 mL via INTRAVENOUS

## 2023-09-02 NOTE — ED Provider Notes (Signed)
 [place] EMERGENCY DEPARTMENT AT [facility] Provider Note  CSN: [id number] Arrival date & time: 09/01/23 2039  Chief Complaint(s) Vaginal Bleeding  HPI Shannon [name] is a 89 y.o. female with a past medical history listed below including CHF, atrial fibrillation on Eliquis here for 2 days of intermittent bleeding believed to be vaginal in nature.  Patient denies blood in her bowel movements.  Does not feel that it comes with urination.  Reports that she has 4-5 episodes during the day.  No nausea or vomiting.  No abdominal pain.  No dysuria.  She does have frequency.  No other physical complaints.  Patient is on [facility]  The history is provided by the patient.    Past Medical History Past Medical History:  Diagnosis Date   Acute exacerbation of CHF (congestive heart failure) (HCC) 09/24/2017   Arthritis    "all over"   Atrial fibrillation (HCC)    Chronic diastolic CHF (congestive heart failure) (HCC) 09/24/2017   Chronic kidney disease    Dyspnea    Gout    Hypercholesteremia    Hypertension    Mitral regurgitation    Patient Active Problem List   Diagnosis Date Noted   Pneumonia due to COVID-19 virus 07/07/2020   Elevated troponin 07/07/2020   CKD (chronic kidney disease), stage III (HCC) 07/07/2020   Hyperlipidemia 09/24/2017   Hypertension 09/24/2017   Chronic diastolic (congestive) heart failure (HCC) 09/24/2017   Gout 09/24/2017   Osteoarthritis of right knee 08/04/2017   Paroxysmal atrial fibrillation (HCC) 10/25/2016   Renal arterial hypertension 07/31/2014   Home Medication(s) Prior to Admission medications   Medication Sig Start Date End Date Taking? Authorizing Provider  cephALEXin (KEFLEX) 500 MG capsule Take 1 capsule (500 mg total) by mouth 3 (three) times daily for 10 days. 09/02/23 09/12/23 Yes [place] Yeats, Amadeo Garnet, MD  empagliflozin (JARDIANCE) 10 MG TABS tablet Take 1 tablet (10 mg total) by mouth daily. 06/30/23   Yates Decamp, MD   hydrALAZINE (APRESOLINE) 100 MG tablet Take 100 mg by mouth 3 (three) times daily. 05/27/20   [provider]  isosorbide dinitrate (ISORDIL) 30 MG tablet TAKE 1 TABLET(30 MG) BY MOUTH THREE TIMES DAILY 04/08/22   Yates Decamp, MD  pindolol (VISKEN) 5 MG tablet TAKE 1 TABLET(5 MG) BY MOUTH TWICE DAILY 03/25/20   Yates Decamp, MD  Potassium Chloride ER 20 MEQ TBCR Take 1 tablet by mouth daily. 04/21/22   [provider]  torsemide (DEMADEX) 20 MG tablet Take 1 tablet (20 mg total) by mouth 2 (two) times daily at 10 am and 4 pm. 06/30/22   Yates Decamp, MD  XARELTO 15 MG TABS tablet TAKE 1 TABLET BY MOUTH EVERY EVENING AFTER DINNER 12/01/19   Yates Decamp, MD  Allergies Amlodipine and Zestoretic [lisinopril-hydrochlorothiazide]  Review of Systems Review of Systems As noted in HPI  Physical Exam Vital Signs  I have reviewed the triage vital signs BP (!) 146/79   Pulse 81   Temp 98.2 F (36.8 C) (Oral)   Resp 20   Ht 5\' 7"  (1.702 m)   Wt 102 kg   SpO2 96%   BMI 35.22 kg/m   Physical Exam Vitals reviewed. Exam conducted with a chaperone present.  Constitutional:      General: She is not in acute distress.    Appearance: She is well-developed. She is not diaphoretic.  HENT:     Head: Normocephalic and atraumatic.     Right Ear: External ear normal.     Left Ear: External ear normal.     Nose: Nose normal.  Eyes:     General: No scleral icterus.    Conjunctiva/sclera: Conjunctivae normal.  Neck:     Trachea: Phonation normal.  Cardiovascular:     Rate and Rhythm: Normal rate and regular rhythm.  Pulmonary:     Effort: Pulmonary effort is normal. No respiratory distress.     Breath sounds: No stridor.  Abdominal:     General: There is distension.     Tenderness: There is abdominal tenderness in the suprapubic area.  Genitourinary:     Vagina: Normal. No vaginal discharge, erythema, bleeding or lesions.     Cervix: No discharge or cervical bleeding.     Comments: Small amount of blood surrounding urethral meatus. No active bleeding. Musculoskeletal:        General: Normal range of motion.     Cervical back: Normal range of motion.  Neurological:     Mental Status: She is alert and oriented to person, place, and time.  Psychiatric:        Behavior: Behavior normal.     ED Results and Treatments Labs (all labs ordered are listed, but only abnormal results are displayed) Labs Reviewed  COMPREHENSIVE METABOLIC PANEL - Abnormal; Notable for the following components:      Result Value   Glucose, Bld 114 (*)    Creatinine, Ser 1.54 (*)    Calcium 8.6 (*)    Albumin 3.1 (*)    GFR, Estimated 32 (*)    All other components within normal limits  CBC - Abnormal; Notable for the following components:   MCV 100.3 (*)    All other components within normal limits  URINALYSIS, ROUTINE W REFLEX MICROSCOPIC - Abnormal; Notable for the following components:   Color, Urine STRAW (*)    APPearance HAZY (*)    Specific Gravity, Urine 1.003 (*)    Glucose, UA >=500 (*)    Hgb urine dipstick SMALL (*)    Leukocytes,Ua LARGE (*)    Bacteria, UA RARE (*)    All other components within normal limits  PROTIME-INR - Abnormal; Notable for the following components:   Prothrombin Time 24.9 (*)    INR 2.2 (*)    All other components within normal limits  SAMPLE TO BLOOD BANK  EKG  EKG Interpretation Date/Time:    Ventricular Rate:    PR Interval:    QRS Duration:    QT Interval:    QTC Calculation:   R Axis:      Text Interpretation:         Radiology CT ABDOMEN PELVIS W CONTRAST Result Date: 09/02/2023 CLINICAL DATA:  89 year old female with vaginal bleeding and abdominal pain. EXAM: CT ABDOMEN AND  PELVIS WITH CONTRAST TECHNIQUE: Multidetector CT imaging of the abdomen and pelvis was performed using the standard protocol following bolus administration of intravenous contrast. RADIATION DOSE REDUCTION: This exam was performed according to the departmental dose-optimization program which includes automated exposure control, adjustment of the mA and/or kV according to patient size and/or use of iterative reconstruction technique. CONTRAST:  75mL OMNIPAQUE IOHEXOL 350 MG/ML SOLN COMPARISON:  CT of the right hip 11/14/2021. FINDINGS: Lower chest: Cardiomegaly. Calcified coronary artery atherosclerosis. Tortuous descending thoracic aorta with calcified plaque. No pericardial effusion. Small layering pleural effusions with simple fluid density. Left greater than right lung base atelectasis. Hepatobiliary: Nodular, cirrhotic appearing liver contour (series 3, image 33) although with no ascites or discrete liver lesion. Diminutive or absent gallbladder. Pancreas: Negative. Spleen: No splenomegaly. Solitary intermediate density circumscribed 13 mm area in the spleen on series 3, image 23 is most likely benign such as hemangioma (no follow-up imaging recommended). Adrenals/Urinary Tract: Normal adrenal glands. There is a degree of bilateral renal atrophy but the kidneys are nonobstructed with symmetric enhancement and some early contrast excretion on the delayed images. Decompressed ureters. Trace gas within the urinary bladder which is mildly distended (sagittal image 71). No bladder wall thickening or convincing perivesical inflammation. Stomach/Bowel: Redundant large bowel, especially the sigmoid colon which is gas-filled. Average volume of retained stool. Normal appendix located inferior to the cecum which is on a lax mesentery, series 3, image 56. Nondilated terminal ileum. No small bowel dilatation. Decompressed stomach and duodenum. No free air or free fluid. Pronounced rectus muscle diastasis but no over ventral  abdominal hernia. Vascular/Lymphatic: Tortuosity of the aorta with extensive Aortoiliac calcified atherosclerosis. Major arterial structures in the abdomen and pelvis remain patent. Negative for abdominal aortic aneurysm. Early portal venous phase timing on the initial images, major portal venous structures appear patent on the delayed images. No lymphadenopathy identified. Reproductive: Diminutive and within normal limits except for trace gas within the endometrial canal (series 8, image 77, of uncertain etiology and significance. Other: No pelvis free fluid. Musculoskeletal: Osteopenia with hyperostosis and widespread spinal ankylosis. Bilateral chronic hip degeneration is moderate to severe. SI joints are partially ankylosed. No acute osseous abnormality identified. Asymmetric posterior body wall subcutaneous edema or stranding, most visible on series 3, image 48 and greater along the right flank. No soft tissue gas identified. IMPRESSION: 1. Trace gas within the urinary bladder. Consider gas-forming UTI unless explained by recent catheterization. 2. Trace gas also within the endometrial canal of unclear etiology and significance. Otherwise diminutive uterus and adnexa appear normal. 3. Nodular liver contour suspicious for Cirrhosis. But no stigmata of portal venous hypertension or other complicating features. 4. Nonspecific asymmetric dependent body wall edema or inflammation most pronounced at the right flank. 5. Redundant large bowel but no evidence of bowel obstruction or inflammation. Normal appendix. 6. Cardiomegaly. Trace pleural effusions and bilateral lung base atelectasis. 7.  Aortic Atherosclerosis (ICD10-I70.0). Electronically Signed   By: Odessa Fleming M.D.   On: 09/02/2023 06:28    Medications Ordered in ED Medications  iohexol (OMNIPAQUE) 350 MG/ML  injection 75 mL (75 mLs Intravenous Contrast Given 09/02/23 0558)   Procedures Procedures  (including critical care time) Medical Decision Making /  ED Course   Medical Decision Making Amount and/or Complexity of Data Reviewed Labs: ordered. Decision-making details documented in ED Course. Radiology: ordered and independent interpretation performed. Decision-making details documented in ED Course.  Risk Prescription drug management. Decision regarding hospitalization.    Patient presents for bleeding concerning for vaginal bleeding. Differential diagnosis considered and workup below.  Exam favors blood coming from the ureteral meatus.  Likely UTI given UA.  Pelvic exam not concerning for GYN etiology and no obvious mass noted on exam. CT scan was obtained and did not reveal any evidence of serious intra-abdominal [place]/infectious process or large uterine mass.  No renal mass noted.  No obvious bladder mass.  Small amount of gas in the urinary bladder was secondary to attempted In-N-Out cath.  Unlikely gas-forming bacteria.  Labs are otherwise reassuring without leukocytosis or anemia.  Renal function intact.  Will treat for UTI.    Final Clinical Impression(s) / ED Diagnoses Final diagnoses:  Acute lower UTI  Gross hematuria   The patient appears reasonably screened and/or stabilized for discharge and I doubt any other medical condition or other [facility] requiring further screening, evaluation, or treatment in the ED at this time. I have discussed the findings, Dx and Tx plan with the patient/family who expressed understanding and agree(s) with the plan. Discharge instructions discussed at length. The patient/family was given strict return precautions who verbalized understanding of the instructions. No further questions at time of discharge.  Disposition: Discharge  Condition: Good  ED Discharge Orders          Ordered    cephALEXin (KEFLEX) 500 MG capsule  3 times daily        09/02/23 6295             Follow Up: Georgianne Fick, MD [address] [place] [address] [phone]  Call  to  schedule an appointment for close follow up  Zettie Pho, MD [address] [place] [address] [phone]  Call  to schedule an appointment for close follow up    This chart was dictated using voice recognition software.  Despite best efforts to proofread,  errors can occur which can change the documentation meaning.    Nira Conn, MD 09/02/23 [phone]

## 2023-09-02 NOTE — ED Notes (Signed)
 Attempted to straight cath patient with female NT at bedside. First attempt unsuccessful.

## 2023-09-13 DIAGNOSIS — M79671 Pain in right foot: Secondary | ICD-10-CM | POA: Diagnosis not present

## 2023-09-13 DIAGNOSIS — L84 Corns and callosities: Secondary | ICD-10-CM | POA: Diagnosis not present

## 2023-09-19 ENCOUNTER — Other Ambulatory Visit: Payer: Self-pay | Admitting: Cardiology

## 2023-09-19 DIAGNOSIS — I5032 Chronic diastolic (congestive) heart failure: Secondary | ICD-10-CM

## 2023-10-25 DIAGNOSIS — I480 Paroxysmal atrial fibrillation: Secondary | ICD-10-CM | POA: Diagnosis not present

## 2023-10-25 DIAGNOSIS — R7309 Other abnormal glucose: Secondary | ICD-10-CM | POA: Diagnosis not present

## 2023-10-25 DIAGNOSIS — Z0000 Encounter for general adult medical examination without abnormal findings: Secondary | ICD-10-CM | POA: Diagnosis not present

## 2023-10-25 DIAGNOSIS — I701 Atherosclerosis of renal artery: Secondary | ICD-10-CM | POA: Diagnosis not present

## 2023-10-25 DIAGNOSIS — D6869 Other thrombophilia: Secondary | ICD-10-CM | POA: Diagnosis not present

## 2023-10-25 DIAGNOSIS — E782 Mixed hyperlipidemia: Secondary | ICD-10-CM | POA: Diagnosis not present

## 2023-10-25 DIAGNOSIS — L299 Pruritus, unspecified: Secondary | ICD-10-CM | POA: Diagnosis not present

## 2023-10-25 DIAGNOSIS — I25118 Atherosclerotic heart disease of native coronary artery with other forms of angina pectoris: Secondary | ICD-10-CM | POA: Diagnosis not present

## 2023-10-25 DIAGNOSIS — I5032 Chronic diastolic (congestive) heart failure: Secondary | ICD-10-CM | POA: Diagnosis not present

## 2023-10-25 DIAGNOSIS — I10 Essential (primary) hypertension: Secondary | ICD-10-CM | POA: Diagnosis not present

## 2023-10-25 DIAGNOSIS — I130 Hypertensive heart and chronic kidney disease with heart failure and stage 1 through stage 4 chronic kidney disease, or unspecified chronic kidney disease: Secondary | ICD-10-CM | POA: Diagnosis not present

## 2023-10-25 DIAGNOSIS — N1832 Chronic kidney disease, stage 3b: Secondary | ICD-10-CM | POA: Diagnosis not present

## 2023-11-12 DIAGNOSIS — L84 Corns and callosities: Secondary | ICD-10-CM | POA: Diagnosis not present

## 2023-11-12 DIAGNOSIS — M79671 Pain in right foot: Secondary | ICD-10-CM | POA: Diagnosis not present

## 2023-12-08 ENCOUNTER — Ambulatory Visit: Admitting: Physician Assistant

## 2023-12-21 DIAGNOSIS — N1832 Chronic kidney disease, stage 3b: Secondary | ICD-10-CM | POA: Diagnosis not present

## 2023-12-21 DIAGNOSIS — N390 Urinary tract infection, site not specified: Secondary | ICD-10-CM | POA: Diagnosis not present

## 2023-12-28 DIAGNOSIS — N2581 Secondary hyperparathyroidism of renal origin: Secondary | ICD-10-CM | POA: Diagnosis not present

## 2023-12-28 DIAGNOSIS — I129 Hypertensive chronic kidney disease with stage 1 through stage 4 chronic kidney disease, or unspecified chronic kidney disease: Secondary | ICD-10-CM | POA: Diagnosis not present

## 2023-12-28 DIAGNOSIS — N1832 Chronic kidney disease, stage 3b: Secondary | ICD-10-CM | POA: Diagnosis not present

## 2023-12-28 DIAGNOSIS — D631 Anemia in chronic kidney disease: Secondary | ICD-10-CM | POA: Diagnosis not present

## 2024-01-05 DIAGNOSIS — L84 Corns and callosities: Secondary | ICD-10-CM | POA: Diagnosis not present

## 2024-01-14 ENCOUNTER — Emergency Department (HOSPITAL_BASED_OUTPATIENT_CLINIC_OR_DEPARTMENT_OTHER)

## 2024-01-14 ENCOUNTER — Encounter (HOSPITAL_BASED_OUTPATIENT_CLINIC_OR_DEPARTMENT_OTHER): Payer: Self-pay

## 2024-01-14 ENCOUNTER — Other Ambulatory Visit: Payer: Self-pay

## 2024-01-14 ENCOUNTER — Inpatient Hospital Stay (HOSPITAL_BASED_OUTPATIENT_CLINIC_OR_DEPARTMENT_OTHER)
Admission: EM | Admit: 2024-01-14 | Discharge: 2024-01-19 | DRG: 291 | Disposition: A | Discharge status: Home / Self Care | Attending: Internal Medicine | Admitting: Internal Medicine

## 2024-01-14 DIAGNOSIS — Z9842 Cataract extraction status, left eye: Secondary | ICD-10-CM | POA: Diagnosis not present

## 2024-01-14 DIAGNOSIS — N1832 Chronic kidney disease, stage 3b: Secondary | ICD-10-CM | POA: Diagnosis present

## 2024-01-14 DIAGNOSIS — Z79899 Other long term (current) drug therapy: Secondary | ICD-10-CM

## 2024-01-14 DIAGNOSIS — E7800 Pure hypercholesterolemia, unspecified: Secondary | ICD-10-CM | POA: Diagnosis present

## 2024-01-14 DIAGNOSIS — I340 Nonrheumatic mitral (valve) insufficiency: Secondary | ICD-10-CM | POA: Diagnosis present

## 2024-01-14 DIAGNOSIS — M1990 Unspecified osteoarthritis, unspecified site: Secondary | ICD-10-CM | POA: Diagnosis present

## 2024-01-14 DIAGNOSIS — E876 Hypokalemia: Secondary | ICD-10-CM | POA: Diagnosis present

## 2024-01-14 DIAGNOSIS — R0989 Other specified symptoms and signs involving the circulatory and respiratory systems: Secondary | ICD-10-CM | POA: Diagnosis not present

## 2024-01-14 DIAGNOSIS — Z7901 Long term (current) use of anticoagulants: Secondary | ICD-10-CM

## 2024-01-14 DIAGNOSIS — R0602 Shortness of breath: Secondary | ICD-10-CM | POA: Diagnosis not present

## 2024-01-14 DIAGNOSIS — I5033 Acute on chronic diastolic (congestive) heart failure: Principal | ICD-10-CM | POA: Diagnosis present

## 2024-01-14 DIAGNOSIS — R0902 Hypoxemia: Secondary | ICD-10-CM | POA: Diagnosis present

## 2024-01-14 DIAGNOSIS — I509 Heart failure, unspecified: Secondary | ICD-10-CM | POA: Diagnosis not present

## 2024-01-14 DIAGNOSIS — Z8249 Family history of ischemic heart disease and other diseases of the circulatory system: Secondary | ICD-10-CM | POA: Diagnosis not present

## 2024-01-14 DIAGNOSIS — I4821 Permanent atrial fibrillation: Secondary | ICD-10-CM | POA: Diagnosis present

## 2024-01-14 DIAGNOSIS — Z87891 Personal history of nicotine dependence: Secondary | ICD-10-CM | POA: Diagnosis not present

## 2024-01-14 DIAGNOSIS — Z9841 Cataract extraction status, right eye: Secondary | ICD-10-CM | POA: Diagnosis not present

## 2024-01-14 DIAGNOSIS — I130 Hypertensive heart and chronic kidney disease with heart failure and stage 1 through stage 4 chronic kidney disease, or unspecified chronic kidney disease: Secondary | ICD-10-CM | POA: Diagnosis not present

## 2024-01-14 DIAGNOSIS — Z888 Allergy status to other drugs, medicaments and biological substances status: Secondary | ICD-10-CM

## 2024-01-14 DIAGNOSIS — I110 Hypertensive heart disease with heart failure: Secondary | ICD-10-CM | POA: Diagnosis not present

## 2024-01-14 DIAGNOSIS — Z7984 Long term (current) use of oral hypoglycemic drugs: Secondary | ICD-10-CM | POA: Diagnosis not present

## 2024-01-14 DIAGNOSIS — I10 Essential (primary) hypertension: Secondary | ICD-10-CM | POA: Diagnosis present

## 2024-01-14 LAB — CBC WITH DIFFERENTIAL/PLATELET
Abs Immature Granulocytes: 0.1 K/uL — ABNORMAL HIGH (ref 0.00–0.07)
Basophils Absolute: 0 K/uL (ref 0.0–0.1)
Basophils Relative: 1 %
Eosinophils Absolute: 0 K/uL (ref 0.0–0.5)
Eosinophils Relative: 1 %
HCT: 40.2 % (ref 36.0–46.0)
Hemoglobin: 12.5 g/dL (ref 12.0–15.0)
Immature Granulocytes: 1 %
Lymphocytes Relative: 25 %
Lymphs Abs: 1.8 K/uL (ref 0.7–4.0)
MCH: 30 pg (ref 26.0–34.0)
MCHC: 31.1 g/dL (ref 30.0–36.0)
MCV: 96.4 fL (ref 80.0–100.0)
Monocytes Absolute: 0.7 K/uL (ref 0.1–1.0)
Monocytes Relative: 9 %
Neutro Abs: 4.7 K/uL (ref 1.7–7.7)
Neutrophils Relative %: 63 %
Platelets: 179 K/uL (ref 150–400)
RBC: 4.17 MIL/uL (ref 3.87–5.11)
RDW: 17.1 % — ABNORMAL HIGH (ref 11.5–15.5)
WBC: 7.3 K/uL (ref 4.0–10.5)
nRBC: 0 % (ref 0.0–0.2)

## 2024-01-14 LAB — COMPREHENSIVE METABOLIC PANEL WITH GFR
ALT: <5 U/L (ref 0–44)
AST: 28 U/L (ref 15–41)
Albumin: 3.6 g/dL (ref 3.5–5.0)
Alkaline Phosphatase: 84 U/L (ref 38–126)
Anion gap: 15 (ref 5–15)
BUN: 21 mg/dL (ref 8–23)
CO2: 24 mmol/L (ref 22–32)
Calcium: 9.5 mg/dL (ref 8.9–10.3)
Chloride: 103 mmol/L (ref 98–111)
Creatinine, Ser: 1.53 mg/dL — ABNORMAL HIGH (ref 0.44–1.00)
GFR, Estimated: 32 mL/min — ABNORMAL LOW (ref >60)
Glucose, Bld: 95 mg/dL (ref 70–99)
Potassium: 4.3 mmol/L (ref 3.5–5.1)
Sodium: 142 mmol/L (ref 135–145)
Total Bilirubin: 1.8 mg/dL — ABNORMAL HIGH (ref 0.0–1.2)
Total Protein: 7.6 g/dL (ref 6.5–8.1)

## 2024-01-14 LAB — PRO BRAIN NATRIURETIC PEPTIDE: Pro Brain Natriuretic Peptide: 2973 pg/mL — ABNORMAL HIGH (ref <300.0)

## 2024-01-14 LAB — TROPONIN T, HIGH SENSITIVITY
Troponin T High Sensitivity: 15 ng/L (ref <19)
Troponin T High Sensitivity: <15 ng/L (ref <19)

## 2024-01-14 MED ORDER — POTASSIUM CHLORIDE CRYS ER 20 MEQ PO TBCR
20.0000 meq | EXTENDED_RELEASE_TABLET | Freq: Every day | ORAL | Status: DC
  Administered 2024-01-15 – 2024-01-19 (×5): 20 meq via ORAL
  Filled 2024-01-14 (×5): qty 1

## 2024-01-14 MED ORDER — GABAPENTIN 100 MG PO CAPS
100.0000 mg | ORAL_CAPSULE | Freq: Every day | ORAL | Status: DC
  Administered 2024-01-14 – 2024-01-18 (×5): 100 mg via ORAL
  Filled 2024-01-14 (×5): qty 1

## 2024-01-14 MED ORDER — ONDANSETRON HCL 4 MG/2ML IJ SOLN
4.0000 mg | Freq: Four times a day (QID) | INTRAMUSCULAR | Status: DC | PRN
  Filled 2024-01-14: qty 2

## 2024-01-14 MED ORDER — RIVAROXABAN 15 MG PO TABS
15.0000 mg | ORAL_TABLET | Freq: Every day | ORAL | Status: DC
Start: 2024-01-15 — End: 2024-01-19
  Administered 2024-01-15 – 2024-01-18 (×4): 15 mg via ORAL
  Filled 2024-01-14 (×4): qty 1

## 2024-01-14 MED ORDER — EMPAGLIFLOZIN 10 MG PO TABS
10.0000 mg | ORAL_TABLET | Freq: Every day | ORAL | Status: DC
  Administered 2024-01-15 – 2024-01-19 (×5): 10 mg via ORAL
  Filled 2024-01-14 (×5): qty 1

## 2024-01-14 MED ORDER — ACETAMINOPHEN 650 MG RE SUPP: 650.0000 mg | Freq: Four times a day (QID) | RECTAL | Status: DC | PRN

## 2024-01-14 MED ORDER — ISOSORBIDE DINITRATE 10 MG PO TABS
30.0000 mg | ORAL_TABLET | Freq: Three times a day (TID) | ORAL | Status: DC
  Administered 2024-01-14 – 2024-01-19 (×14): 30 mg via ORAL
  Filled 2024-01-14 (×14): qty 3

## 2024-01-14 MED ORDER — SODIUM CHLORIDE 0.9% FLUSH
3.0000 mL | Freq: Two times a day (BID) | INTRAVENOUS | Status: DC
  Administered 2024-01-14 – 2024-01-19 (×10): 3 mL via INTRAVENOUS

## 2024-01-14 MED ORDER — HYDRALAZINE HCL 50 MG PO TABS
100.0000 mg | ORAL_TABLET | Freq: Three times a day (TID) | ORAL | Status: DC
  Administered 2024-01-14 – 2024-01-19 (×14): 100 mg via ORAL
  Filled 2024-01-14 (×14): qty 2

## 2024-01-14 MED ORDER — FUROSEMIDE 10 MG/ML IJ SOLN
40.0000 mg | Freq: Once | INTRAMUSCULAR | Status: AC
  Administered 2024-01-14: 40 mg via INTRAVENOUS
  Filled 2024-01-14: qty 4

## 2024-01-14 MED ORDER — FUROSEMIDE 10 MG/ML IJ SOLN
40.0000 mg | Freq: Two times a day (BID) | INTRAMUSCULAR | Status: DC
  Administered 2024-01-14: 40 mg via INTRAVENOUS
  Filled 2024-01-14: qty 4

## 2024-01-14 MED ORDER — ONDANSETRON HCL 4 MG PO TABS: 4.0000 mg | ORAL_TABLET | Freq: Four times a day (QID) | ORAL | Status: DC | PRN

## 2024-01-14 MED ORDER — ACETAMINOPHEN 325 MG PO TABS
650.0000 mg | ORAL_TABLET | Freq: Four times a day (QID) | ORAL | Status: DC | PRN
Start: 2024-01-14 — End: 2024-01-19
  Filled 2024-01-14: qty 2

## 2024-01-14 MED ORDER — SENNOSIDES-DOCUSATE SODIUM 8.6-50 MG PO TABS: 1.0000 | ORAL_TABLET | Freq: Every evening | ORAL | Status: DC | PRN

## 2024-01-14 MED ORDER — PINDOLOL 5 MG PO TABS
5.0000 mg | ORAL_TABLET | Freq: Two times a day (BID) | ORAL | Status: DC
  Administered 2024-01-15 – 2024-01-19 (×9): 5 mg via ORAL
  Filled 2024-01-14 (×10): qty 1

## 2024-01-14 NOTE — Care Plan (Signed)
 Pt received to unit w/o incident. Aox3, no c/o pain. VSS on room air. Telemetry verified. Abd rounded, tender, soft. Skin warm, dry, intact. BUE redness, bruising. BLE +4 edema. Safety precautions in place, call light within reach. Family at bedside.

## 2024-01-14 NOTE — Plan of Care (Signed)
 Plan of Care Note for accepted transfer   Patient name: Shannon [name] FMW:[id number] DOB: Mar 10, 1931  Facility requesting transfer: [facility] ED Requesting Provider: PA Smoot, Sarah  Reason for transfer: CHF exacerbation Facility course: 89 year old female with history of chronic HFpEF, hypertension, permanent A-fib on anticoagulation, CKD stage IIIb, gout, hypertension, hyperlipidemia, mild mitral regurgitation presented to the ED for evaluation of shortness of breath.  Troponin negative x 2.  proBNP 2973 and chest x-ray showing pulmonary edema.  Creatinine at baseline and patient was given IV Lasix  40 mg.  Oxygen saturation in the low 90s on room air.  Plan of care: The patient is accepted for admission to Telemetry unit at [facility].  [facility] will assume care on arrival to accepting facility. Until arrival, care as per EDP. However, [facility] available 24/7 for questions and assistance.  Check [email or web address] for on-call coverage.  Nursing staff, please call [facility] Admits & Consults System-Wide number under Amion on patient's arrival so appropriate admitting provider can evaluate the pt.

## 2024-01-14 NOTE — ED Triage Notes (Signed)
 Pt c/o SHOB onset approx 1wk ago, concern for fluid overload. Advises clearing throat-type cough, denies increase in swelling.  Hx CHF, Afib, CKD

## 2024-01-14 NOTE — Hospital Course (Signed)
 Shannon [name] is a 89 y.o. female with medical history significant for permanent atrial fibrillation on Xarelto , chronic HFpEF, CKD stage IIIb, HTN, HLD who is admitted with acute on chronic HFpEF.

## 2024-01-14 NOTE — H&P (Signed)
 History and Physical    Shannon [name] FMW:[id number] DOB: 1931/01/08 DOA: 01/14/2024  PCP: Verdia Lombard, MD  Patient coming from: Home  I have personally briefly reviewed patient's old medical records in [facility]  Chief Complaint: Shortness of breath  HPI: Shannon [name] is a 89 y.o. female with medical history significant for permanent atrial fibrillation on Xarelto , chronic HFpEF, CKD stage IIIb, HTN, HLD who presented to the ED for evaluation of shortness of breath.  Patient reports progressive shortness of breath over the last week.  Dyspnea occurs at rest and is worse with exertion.  She has had increased swelling of both lower extremities.  She has had orthopnea.  She has been taking her torsemide  20 mg twice daily.  She has not had any chest pain.  [facility] ED Course  Labs/Imaging on admission: I have personally reviewed following labs and imaging studies.  Initial vitals showed BP 167/95, pulse 85, RR 20, temp 97.8 F, SpO2 94% on room air.  Labs showed proBNP 2973, troponin T <15, sodium 142, potassium 4.3, bicarb 24, BUN 21, creatinine 1.53, serum glucose 95, WBC 7.3, hemoglobin 12.5, platelets 179.  Portable chest x-ray showed mild to moderate pulmonary vascular congestion with hilar and lower lobe predominance.  Bilateral small to moderate layering pleural effusions.  Patient was given IV Lasix  40 mg and the hospitalist service was consulted to admit.  Review of Systems: All systems reviewed and are negative except as documented in history of present illness above.   Past Medical History:  Diagnosis Date   Acute exacerbation of CHF (congestive heart failure) (HCC) 09/24/2017   Arthritis    all over   Atrial fibrillation (HCC)    Chronic diastolic CHF (congestive heart failure) (HCC) 09/24/2017   Chronic kidney disease    Dyspnea    Gout    Hypercholesteremia    Hypertension    Mitral regurgitation     Past Surgical History:   Procedure Laterality Date   BREAST BIOPSY Left 1984   CARDIOVERSION N/A 10/27/2016   Procedure: CARDIOVERSION;  Surgeon: Ladona Heinz, MD;  Location: [facility] ENDOSCOPY;  Service: Cardiovascular;  Laterality: N/A;   CATARACT EXTRACTION, BILATERAL Bilateral 2000's   RENAL ANGIOGRAM Bilateral 07/31/2014   Procedure: RENAL ANGIOGRAM;  Surgeon: Erick JONELLE Ladona, MD;  Location[facility] C[facility] LAB;  Service: Cardiovascular;  Laterality: Bilateral;    Social History: Social History   Tobacco Use   Smoking status: Never   Smokeless tobacco: Former    Types: Snuff   Tobacco comments:    quit using snuff in the 1960's  Vaping Use   Vaping status: Never Used  Substance Use Topics   Alcohol use: No   Drug use: No   Allergies  Allergen Reactions   Amlodipine  Swelling    Intolerance, LE edema    Zestoretic [Lisinopril-Hydrochlorothiazide] Itching and Rash    Family History  Problem Relation Age of Onset   Heart attack Father    Breast cancer Neg Hx      Prior to Admission medications   Medication Sig Start Date End Date Taking? Authorizing Provider  gabapentin (NEURONTIN) 100 MG capsule Take 100 mg by mouth at bedtime. 12/28/23  Yes [provider]  Vitamin D, Ergocalciferol, (DRISDOL) 1.25 MG (50000 UNIT) CAPS capsule Take 50,000 Units by mouth once a week. 12/28/23  Yes [provider]  empagliflozin  (JARDIANCE ) 10 MG TABS tablet Take 1 tablet (10 mg total) by mouth daily. 06/30/23   Ladona,  Gordy, MD  hydrALAZINE  (APRESOLINE ) 100 MG tablet Take 100 mg by mouth 3 (three) times daily. 05/27/20   [provider]  isosorbide  dinitrate (ISORDIL ) 30 MG tablet TAKE 1 TABLET(30 MG) BY MOUTH THREE TIMES DAILY 04/08/22   Ladona Gordy, MD  pindolol  (VISKEN ) 5 MG tablet TAKE 1 TABLET(5 MG) BY MOUTH TWICE DAILY 03/25/20   Ladona Gordy, MD  Potassium Chloride  ER 20 MEQ TBCR Take 1 tablet by mouth daily. 04/21/22   [provider]  torsemide  (DEMADEX ) 20 MG tablet TAKE 1 TABLET(20  MG) BY MOUTH TWICE DAILY AT 10 AM AND AT 4 PM 09/20/23   Ladona Gordy, MD  XARELTO  15 MG TABS tablet TAKE 1 TABLET BY MOUTH EVERY EVENING AFTER DINNER 12/01/19   Ladona Gordy, MD    Physical Exam: Vitals:   01/14/24 1930 01/14/24 2000 01/14/24 2030 01/14/24 2126  BP: (!) 165/82 (!) 141/80 (!) 143/65 (!) 162/94  Pulse: 81 85 72 80  Resp: (!) 27 (!) 25 (!) 22 (!) 23  Temp:    98.1 F (36.7 C)  TempSrc:    Oral  SpO2: 91% 93% 92% 95%  Weight:    100.5 kg  Height:    5' 9 (1.753 m)   Constitutional: Resting in bed, NAD, calm, comfortable Eyes: EOMI, lids and conjunctivae normal ENMT: Mucous membranes are moist. Posterior pharynx clear of any exudate or lesions.Normal dentition.  Neck: normal, supple, no masses. Respiratory: Faint bibasilar inspiratory crackles. Normal respiratory effort. No accessory muscle use.  Cardiovascular: Irregularly irregular, no murmurs / rubs / gallops.  +3 bilateral lower extremity edema. 2+ pedal pulses. Abdomen: no tenderness, no masses palpated. Musculoskeletal: no clubbing / cyanosis. No joint deformity upper and lower extremities. Good ROM, no contractures. Normal muscle tone.  Skin: no rashes, lesions, ulcers. No induration Neurologic: Sensation intact. Strength 5/5 in all 4.  Psychiatric: Normal judgment and insight. Alert and oriented x 3. Normal mood.   EKG: Personally reviewed. Atrial fibrillation, rate 84, low voltage, QTc 506.  Assessment/Plan Principal Problem:   Acute on chronic heart failure with preserved ejection fraction (HFpEF, >= 50%) (HCC) Active Problems:   Permanent atrial fibrillation (HCC)   Hypertension   Chronic kidney disease, stage 3b (HCC)   Shannon [name] is a 89 y.o. female with medical history significant for permanent atrial fibrillation on Xarelto , chronic HFpEF, CKD stage IIIb, HTN, HLD who is admitted with acute on chronic HFpEF.  Assessment and Plan: Acute on chronic HFpEF: Patient presenting with dyspnea,  orthopnea, lower extremity edema.  CXR consistent with pulmonary edema and mild pleural effusions.  proBNP 2973. TTE 08/19/2023 showed EF 60-65%, mild concentric LVH severely dilated atria. - Continue IV Lasix  40 mg twice daily - Continue pindolol , Isordil , hydralazine  - Continue Jardiance   Permanent atrial fibrillation: Remains in atrial fibrillation, controlled rate.  Continue pindolol  and Xarelto .  CKD stage IIIb: Renal function stable.  Monitor with diuresis.  Hypertension: Continue hydralazine , Isordil , pindolol .   DVT prophylaxis:  Rivaroxaban  (XARELTO ) tablet 15 mg  Code Status: Full code, confirmed with patient on admission Family Communication: Daughters at bedside Disposition Plan: From home and likely discharge to home pending clinical progress Consults called: None Severity of Illness: The appropriate patient status for this patient is OBSERVATION. Observation status is judged to be reasonable and necessary in order to provide the required intensity of service to ensure the patient's safety. The patient's presenting symptoms, physical exam findings, and initial radiographic and laboratory data in the context of their  medical condition is felt to place them at decreased risk for further clinical deterioration. Furthermore, it is anticipated that the patient will be medically stable for discharge from the hospital within 2 midnights of admission.   Jorie Blanch MD [facility]  If 7PM-7AM, please contact night-coverage [email or web address]  01/14/2024, 11:12 PM

## 2024-01-15 DIAGNOSIS — Z7984 Long term (current) use of oral hypoglycemic drugs: Secondary | ICD-10-CM | POA: Diagnosis not present

## 2024-01-15 DIAGNOSIS — Z8249 Family history of ischemic heart disease and other diseases of the circulatory system: Secondary | ICD-10-CM | POA: Diagnosis not present

## 2024-01-15 DIAGNOSIS — E876 Hypokalemia: Secondary | ICD-10-CM | POA: Diagnosis present

## 2024-01-15 DIAGNOSIS — I340 Nonrheumatic mitral (valve) insufficiency: Secondary | ICD-10-CM | POA: Diagnosis present

## 2024-01-15 DIAGNOSIS — R0902 Hypoxemia: Secondary | ICD-10-CM | POA: Diagnosis present

## 2024-01-15 DIAGNOSIS — Z79899 Other long term (current) drug therapy: Secondary | ICD-10-CM | POA: Diagnosis not present

## 2024-01-15 DIAGNOSIS — Z9842 Cataract extraction status, left eye: Secondary | ICD-10-CM | POA: Diagnosis not present

## 2024-01-15 DIAGNOSIS — I5033 Acute on chronic diastolic (congestive) heart failure: Secondary | ICD-10-CM | POA: Diagnosis not present

## 2024-01-15 DIAGNOSIS — Z888 Allergy status to other drugs, medicaments and biological substances status: Secondary | ICD-10-CM | POA: Diagnosis not present

## 2024-01-15 DIAGNOSIS — Z87891 Personal history of nicotine dependence: Secondary | ICD-10-CM | POA: Diagnosis not present

## 2024-01-15 DIAGNOSIS — I4821 Permanent atrial fibrillation: Secondary | ICD-10-CM | POA: Diagnosis present

## 2024-01-15 DIAGNOSIS — I509 Heart failure, unspecified: Secondary | ICD-10-CM

## 2024-01-15 DIAGNOSIS — R0602 Shortness of breath: Secondary | ICD-10-CM | POA: Diagnosis present

## 2024-01-15 DIAGNOSIS — Z7901 Long term (current) use of anticoagulants: Secondary | ICD-10-CM | POA: Diagnosis not present

## 2024-01-15 DIAGNOSIS — M1990 Unspecified osteoarthritis, unspecified site: Secondary | ICD-10-CM | POA: Diagnosis present

## 2024-01-15 DIAGNOSIS — Z9841 Cataract extraction status, right eye: Secondary | ICD-10-CM | POA: Diagnosis not present

## 2024-01-15 DIAGNOSIS — E7800 Pure hypercholesterolemia, unspecified: Secondary | ICD-10-CM | POA: Diagnosis present

## 2024-01-15 DIAGNOSIS — N1832 Chronic kidney disease, stage 3b: Secondary | ICD-10-CM | POA: Diagnosis present

## 2024-01-15 DIAGNOSIS — I130 Hypertensive heart and chronic kidney disease with heart failure and stage 1 through stage 4 chronic kidney disease, or unspecified chronic kidney disease: Secondary | ICD-10-CM | POA: Diagnosis present

## 2024-01-15 LAB — BASIC METABOLIC PANEL WITH GFR
Anion gap: 10 (ref 5–15)
BUN: 20 mg/dL (ref 8–23)
CO2: 27 mmol/L (ref 22–32)
Calcium: 8.7 mg/dL — ABNORMAL LOW (ref 8.9–10.3)
Chloride: 104 mmol/L (ref 98–111)
Creatinine, Ser: 1.46 mg/dL — ABNORMAL HIGH (ref 0.44–1.00)
GFR, Estimated: 34 mL/min — ABNORMAL LOW (ref >60)
Glucose, Bld: 102 mg/dL — ABNORMAL HIGH (ref 70–99)
Potassium: 3.3 mmol/L — ABNORMAL LOW (ref 3.5–5.1)
Sodium: 141 mmol/L (ref 135–145)

## 2024-01-15 LAB — CBC
HCT: 37.9 % (ref 36.0–46.0)
Hemoglobin: 11.8 g/dL — ABNORMAL LOW (ref 12.0–15.0)
MCH: 30.1 pg (ref 26.0–34.0)
MCHC: 31.1 g/dL (ref 30.0–36.0)
MCV: 96.7 fL (ref 80.0–100.0)
Platelets: 180 K/uL (ref 150–400)
RBC: 3.92 MIL/uL (ref 3.87–5.11)
RDW: 16.9 % — ABNORMAL HIGH (ref 11.5–15.5)
WBC: 6.6 K/uL (ref 4.0–10.5)
nRBC: 0 % (ref 0.0–0.2)

## 2024-01-15 LAB — MAGNESIUM: Magnesium: 2.1 mg/dL (ref 1.7–2.4)

## 2024-01-15 MED ORDER — PNEUMOCOCCAL 20-VAL CONJ VACC 0.5 ML IM SUSY: 0.5000 mL | PREFILLED_SYRINGE | INTRAMUSCULAR | Status: DC

## 2024-01-15 MED ORDER — FUROSEMIDE 10 MG/ML IJ SOLN
40.0000 mg | Freq: Two times a day (BID) | INTRAMUSCULAR | Status: DC
  Administered 2024-01-15 – 2024-01-19 (×9): 40 mg via INTRAVENOUS
  Filled 2024-01-15 (×8): qty 4

## 2024-01-15 MED ORDER — POTASSIUM CHLORIDE CRYS ER 20 MEQ PO TBCR
40.0000 meq | EXTENDED_RELEASE_TABLET | Freq: Once | ORAL | Status: AC
  Administered 2024-01-15: 40 meq via ORAL
  Filled 2024-01-15: qty 2

## 2024-01-15 NOTE — Plan of Care (Signed)

## 2024-01-15 NOTE — Progress Notes (Signed)
 Progress Note   Patient: Shannon [name] FMW:[id number] DOB: [birth year] DOA: 01/14/2024     0 DOS: the patient was seen and examined on 01/15/2024   Brief hospital course: Shannon [name] is a 89 y.o. female with medical history significant for permanent atrial fibrillation on Xarelto , chronic HFpEF, CKD stage IIIb, HTN, HLD who is admitted with acute on chronic HFpEF.  Assessment and Plan:  Acute on chronic HFpEF: Patient presenting with dyspnea, orthopnea, lower extremity edema.  CXR consistent with pulmonary edema and mild pleural effusions.  proBNP 2973. TTE 08/19/2023 showed EF 60-65%, mild concentric LVH severely dilated atria. - Continue IV Lasix  40 mg twice daily - Continue pindolol , Isordil , hydralazine  - Continue Jardiance   Hypokalemia - Replete as needed.   Permanent atrial fibrillation: Remains in atrial fibrillation, controlled rate.   -Continue pindolol  and Xarelto .   CKD stage IIIb: Renal function stable.   - Monitor with diuresis.   Hypertension: - Continue hydralazine , Isordil , pindolol .      Subjective: Patient states she is feeling better.  She is less short of breath.  Records indicate she is on torsemide  at home.  Physical Exam: Vitals:   01/15/24 0426 01/15/24 0500 01/15/24 0802 01/15/24 1126  BP: 137/74   (!) 147/79  Pulse: 79  80   Resp: 19     Temp: 98.3 F (36.8 C)  98.2 F (36.8 C) 98.3 F (36.8 C)  TempSrc: Oral  Oral Oral  SpO2: 94%  95%   Weight:  100.5 kg    Height:       Physical Exam   General: Alert, oriented X3  Eyes: Pupils equal, reactive  Oral cavity: moist mucous membranes  Head: Atraumatic, normocephalic  Neck: supple  Chest: Bilateral basal crackles CVS: S1,S2 RRR. No murmurs  Abd: No distention, soft, non-tender. No masses palpable  Extr: Bilateral pedal edema MSK: No joint deformities or swelling  Neurological: Grossly intact.    Data Reviewed:     Latest Ref Rng & Units 01/15/2024    2:53 AM 01/14/2024     2:37 PM 09/01/2023   10:05 PM  CBC  WBC 4.0 - 10.5 K/uL 6.6  7.3  7.2   Hemoglobin 12.0 - 15.0 g/dL 88.1  87.4  87.1   Hematocrit 36.0 - 46.0 % 37.9  40.2  39.4   Platelets 150 - 400 K/uL 180  179  237       Latest Ref Rng & Units 01/15/2024    2:53 AM 01/14/2024    2:37 PM 09/01/2023   10:05 PM  BMP  Glucose 70 - 99 mg/dL 897  95  885   BUN 8 - 23 mg/dL 20  21  22    Creatinine 0.44 - 1.00 mg/dL 8.53  8.46  8.45   Sodium 135 - 145 mmol/L 141  142  140   Potassium 3.5 - 5.1 mmol/L 3.3  4.3  3.8   Chloride 98 - 111 mmol/L 104  103  106   CO2 22 - 32 mmol/L 27  24  23    Calcium  8.9 - 10.3 mg/dL 8.7  9.5  8.6      Family Communication: Spoke with family member in room  Disposition: Status is: Observation The patient will require care spanning > 2 midnights and should be moved to inpatient because: She is requiring diuresis with IV Lasix  and requires hemodynamic monitoring and monitoring of electrolytes while on Lasix .  Planned Discharge Destination: Home    Time spent: 35 minutes  Author: MDALA-GAUSI, Ytzel Gubler AGATHA, MD 01/15/2024 1:41 PM  For on call review [email or web address].

## 2024-01-15 NOTE — ED Provider Notes (Signed)
 [facility] 3E HF PCU Provider Note   CSN: [id number] Arrival date & time: 01/14/24  1339     Patient presents with: No chief complaint on file.   Shannon [name] is a 89 y.o. female.   Patient with history of CHF, A-fib on Xarelto , CKD, hypertension, hyperlipidemia presents today with complaints of shortness of breath.  Reports that she has been symptomatic for the last 1 week.  Reports that she feels like she is fluid overloaded.  She notes swelling in her bilateral legs as well as her abdomen.  She reports a cough as well.  No fevers or chills.  Denies any chest pain.  She has been compliant with her torsemide .  Of note, patient does live at home with her daughter.  She does walk with a walker on occasion but reports that she mostly walks unassisted.  The history is provided by the patient. No language interpreter was used.       Prior to Admission medications   Medication Sig Start Date End Date Taking? Authorizing Provider  gabapentin  (NEURONTIN ) 100 MG capsule Take 100 mg by mouth at bedtime. 12/28/23  Yes [provider]  Vitamin D, Ergocalciferol, (DRISDOL) 1.25 MG (50000 UNIT) CAPS capsule Take 50,000 Units by mouth once a week. 12/28/23  Yes [provider]  empagliflozin  (JARDIANCE ) 10 MG TABS tablet Take 1 tablet (10 mg total) by mouth daily. 06/30/23   Ladona Heinz, MD  hydrALAZINE  (APRESOLINE ) 100 MG tablet Take 100 mg by mouth 3 (three) times daily. 05/27/20   [provider]  isosorbide  dinitrate (ISORDIL ) 30 MG tablet TAKE 1 TABLET(30 MG) BY MOUTH THREE TIMES DAILY 04/08/22   Ladona Heinz, MD  pindolol  (VISKEN ) 5 MG tablet TAKE 1 TABLET(5 MG) BY MOUTH TWICE DAILY 03/25/20   Ladona Heinz, MD  Potassium Chloride  ER 20 MEQ TBCR Take 1 tablet by mouth daily. 04/21/22   [provider]  torsemide  (DEMADEX ) 20 MG tablet TAKE 1 TABLET(20 MG) BY MOUTH TWICE DAILY AT 10 AM AND AT 4 PM 09/20/23   Ladona Heinz, MD  XARELTO  15 MG TABS tablet TAKE 1  TABLET BY MOUTH EVERY EVENING AFTER DINNER 12/01/19   Ladona Heinz, MD    Allergies: Amlodipine  and Zestoretic [lisinopril-hydrochlorothiazide]    Review of Systems  Respiratory:  Positive for shortness of breath.   Cardiovascular:  Positive for leg swelling.  All other systems reviewed and are negative.   Updated Vital Signs BP 117/62 (BP Location: Right Arm)   Pulse 72   Temp 98.1 F (36.7 C) (Oral)   Resp 20   Ht 5' 9 (1.753 m)   Wt 100.5 kg   SpO2 92%   BMI 32.71 kg/m   Physical Exam Vitals and nursing note reviewed.  Constitutional:      General: She is not in acute distress.    Appearance: Normal appearance. She is normal weight. She is not ill-appearing, toxic-appearing or diaphoretic.  HENT:     Head: Normocephalic and atraumatic.  Cardiovascular:     Rate and Rhythm: Normal rate and regular rhythm.     Heart sounds: Normal heart sounds.  Pulmonary:     Effort: Pulmonary effort is normal. No respiratory distress.  Abdominal:     General: Abdomen is flat. There is distension.     Palpations: Abdomen is soft.     Tenderness: There is no abdominal tenderness.  Musculoskeletal:        General: Normal range of motion.  Cervical back: Normal range of motion.     Right lower leg: No edema.     Left lower leg: No edema.     Comments: BLE 3+ pitting edema.  No significant tenderness or palpable cord.  No erythema or warmth.  Pedal pulses intact and 2+  Skin:    General: Skin is warm and dry.  Neurological:     General: No focal deficit present.     Mental Status: She is alert.  Psychiatric:        Mood and Affect: Mood normal.        Behavior: Behavior normal.     (all labs ordered are listed, but only abnormal results are displayed) Labs Reviewed  CBC WITH DIFFERENTIAL/PLATELET - Abnormal; Notable for the following components:      Result Value   RDW 17.1 (*)    Abs Immature Granulocytes 0.10 (*)    All other components within normal limits   COMPREHENSIVE METABOLIC PANEL WITH GFR - Abnormal; Notable for the following components:   Creatinine, Ser 1.53 (*)    Total Bilirubin 1.8 (*)    GFR, Estimated 32 (*)    All other components within normal limits  PRO BRAIN NATRIURETIC PEPTIDE - Abnormal; Notable for the following components:   Pro Brain Natriuretic Peptide 2,973.0 (*)    All other components within normal limits  MAGNESIUM   CBC  BASIC METABOLIC PANEL WITH GFR  TROPONIN T, HIGH SENSITIVITY  TROPONIN T, HIGH SENSITIVITY    EKG: EKG Interpretation Date/Time:  Friday January 14 2024 13:48:53 EDT Ventricular Rate:  84 PR Interval:    QRS Duration:  84 QT Interval:  423 QTC Calculation: 506 R Axis:   257  Text Interpretation: Atrial fibrillation LAD, consider left anterior fascicular block Abnormal lateral Q waves Anterior infarct, old Minimal ST depression, inferior leads Prolonged QT interval Confirmed by Cottie Cough [phone]) on 01/14/2024 2:16:36 PM  Radiology: [facility] Chest Port 1 View Result Date: 01/14/2024 CLINICAL DATA:  Shortness of breath. EXAM: PORTABLE CHEST 1 VIEW COMPARISON:  11/14/2021. FINDINGS: Low lung volume. There is mild-to-moderate pulmonary vascular congestion with hilar and lower lobe predominance. There are bilateral small-to-moderate layering pleural effusions, left more than right. There are probable atelectatic changes at the lung bases. Stable mildly enlarged cardio-mediastinal silhouette. No acute osseous abnormalities. The soft tissues are within normal limits. IMPRESSION: Findings favor congestive heart failure/pulmonary edema. Electronically Signed   By: Ree Molt M.D.   On: 01/14/2024 14:46     Procedures   Medications Ordered in the ED  furosemide  (LASIX ) injection 40 mg (40 mg Intravenous Given 01/14/24 2251)  sodium chloride  flush (NS) 0.9 % injection 3 mL (3 mLs Intravenous Given 01/14/24 2334)  acetaminophen  (TYLENOL ) tablet 650 mg (has no administration in time range)    Or   acetaminophen  (TYLENOL ) suppository 650 mg (has no administration in time range)  ondansetron  (ZOFRAN ) tablet 4 mg (has no administration in time range)    Or  ondansetron  (ZOFRAN ) injection 4 mg (has no administration in time range)  senna-docusate (Senokot-S) tablet 1 tablet (has no administration in time range)  empagliflozin  (JARDIANCE ) tablet 10 mg (has no administration in time range)  gabapentin  (NEURONTIN ) capsule 100 mg (100 mg Oral Given 01/14/24 2330)  hydrALAZINE  (APRESOLINE ) tablet 100 mg (100 mg Oral Given 01/14/24 2330)  isosorbide  dinitrate (ISORDIL ) tablet 30 mg (30 mg Oral Given 01/14/24 2330)  pindolol  (VISKEN ) tablet 5 mg (has no administration in time range)  potassium chloride  SA (  KLOR-CON  M) CR tablet 20 mEq (has no administration in time range)  Rivaroxaban  (XARELTO ) tablet 15 mg (has no administration in time range)  furosemide  (LASIX ) injection 40 mg (40 mg Intravenous Given 01/14/24 1622)                                    Medical Decision Making Amount and/or Complexity of Data Reviewed Labs: ordered. Radiology: ordered.  Risk Prescription drug management. Decision regarding hospitalization.   This patient is a 89 y.o. female who presents to the ED for concern of shortness of breath, this involves an extensive number of treatment options, and is a complaint that carries with it a high risk of complications and morbidity. The emergent differential diagnosis prior to evaluation includes, but is not limited to,  CHF, pericardial effusion/tamponade, arrhythmias, ACS, COPD, asthma, bronchitis, pneumonia, pneumothorax, PE, anemia  This is not an exhaustive differential.   Past Medical History / Co-morbidities / Social History:  has a past medical history of Acute exacerbation of CHF (congestive heart failure) (HCC) (09/24/2017), Arthritis, Atrial fibrillation (HCC), Chronic diastolic CHF (congestive heart failure) (HCC) (09/24/2017), Chronic kidney disease, Dyspnea,  Gout, Hypercholesteremia, Hypertension, and Mitral regurgitation.  Additional history: Chart reviewed. Pertinent results include: Patient takes torsemide  20 mg twice daily for her CHF.  Last echo in 3/25 showed EF 60 to 65%  Physical Exam: Physical exam performed. The pertinent findings include: Clinically appears fluid overloaded, BLE 3+ pitting edema and abdominal distention as well.  Speaking in complete sentences on room air, however does have increased work of breathing.  Lab Tests: I ordered, and personally interpreted labs.  The pertinent results include: Delta troponin WNL, creatinine 1.53 consistent with previous, pro BNP 2,973   Imaging Studies: I ordered imaging studies including CXR. I independently visualized and interpreted imaging which showed   Findings favor congestive heart failure/pulmonary edema.   I agree with the radiologist interpretation.   Cardiac Monitoring:  The patient was maintained on a cardiac monitor. Cardiac monitor showed an underlying rhythm of: afib with rate 84. I agree with this interpretation.   Medications: I ordered medication including IV lasix   for CHF. Reevaluation of the patient after these medicines showed that the patient improved. I have reviewed the patients home medicines and have made adjustments as needed.   Disposition: After consideration of the diagnostic results and the patients response to treatment, I feel that patient will require admission for CHF exacerbation.  Given IV Lasix  with some improvement, however she is still experiencing dyspnea and has significant fluid overload and therefore will need to be admitted for additional diuresis and management.  She continues to be satting in the low 90s on room air.  Discussed plan with patient is understanding and in agreement with this.  Discussed patient with hospitalist Dr. Alfornia who accepts patient for admission.  Findings and plan of care discussed with supervising physician Dr.  Doretha who is in agreement.   Final diagnoses:  Acute on chronic congestive heart failure, unspecified heart failure type [facility])    ED Discharge Orders     None        Nora Lauraine DELENA DEVONNA 01/15/24 0107    Doretha Folks, MD 01/17/24 [phone]

## 2024-01-15 NOTE — Plan of Care (Signed)
   Problem: Education: Goal: Knowledge of General Education information will improve Description Including pain rating scale, medication(s)/side effects and non-pharmacologic comfort measures Outcome: Progressing

## 2024-01-16 DIAGNOSIS — I5033 Acute on chronic diastolic (congestive) heart failure: Secondary | ICD-10-CM | POA: Diagnosis not present

## 2024-01-16 LAB — BASIC METABOLIC PANEL WITH GFR
Anion gap: 11 (ref 5–15)
BUN: 19 mg/dL (ref 8–23)
CO2: 26 mmol/L (ref 22–32)
Calcium: 8.6 mg/dL — ABNORMAL LOW (ref 8.9–10.3)
Chloride: 103 mmol/L (ref 98–111)
Creatinine, Ser: 1.4 mg/dL — ABNORMAL HIGH (ref 0.44–1.00)
GFR, Estimated: 36 mL/min — ABNORMAL LOW (ref >60)
Glucose, Bld: 112 mg/dL — ABNORMAL HIGH (ref 70–99)
Potassium: 3.7 mmol/L (ref 3.5–5.1)
Sodium: 140 mmol/L (ref 135–145)

## 2024-01-16 LAB — MAGNESIUM: Magnesium: 2.1 mg/dL (ref 1.7–2.4)

## 2024-01-16 NOTE — Progress Notes (Addendum)
 Progress Note   Patient: Shannon [name] FMW:[id number] DOB: January 06, 1931 DOA: 01/14/2024     1 DOS: the patient was seen and examined on 01/16/2024   Brief hospital course: Shannon [name] is a 89 y.o. female with medical history significant for permanent atrial fibrillation on Xarelto , chronic HFpEF, CKD stage IIIb, HTN, HLD who is admitted with acute on chronic HFpEF.  Assessment and Plan:  Acute on chronic HFpEF: Patient presenting with dyspnea, orthopnea, lower extremity edema.  CXR consistent with pulmonary edema and mild pleural effusions.  proBNP 2973. TTE 08/19/2023 showed EF 60-65%, mild concentric LVH severely dilated atria. - Continue IV Lasix  40 mg twice daily - Continue pindolol , Isordil , hydralazine  - Continue Jardiance  - Heart failure team consult.  Hypokalemia - Replete as needed.   Permanent atrial fibrillation: Remains in atrial fibrillation, controlled rate.   -Continue pindolol  and Xarelto .   CKD stage IIIb: Renal function stable.   - Monitor with diuresis.   Hypertension: - Continue hydralazine , Isordil , pindolol .  Deconditioning Family states patient has been struggling in ADLs. She does have family around her at home. - PT/OT evaluation.      Subjective: Patient states she is feeling better.  She is less short of breath.  Edema is improving.  Patient states she is compliant with her torsemide  at home.    Physical Exam: Vitals:   01/16/24 0425 01/16/24 1015 01/16/24 1059 01/16/24 1126  BP: (!) 156/91 (!) 142/122 (!) 170/87 (!) 170/87  Pulse: 85   85  Resp: 20     Temp: 98.3 F (36.8 C) 97.9 F (36.6 C)    TempSrc: Oral Oral    SpO2: 92% 94%    Weight: 99.6 kg     Height:       Physical Exam   General: Alert, oriented X3  Eyes: Pupils equal, reactive  Oral cavity: moist mucous membranes  Head: Atraumatic, normocephalic  Neck: supple  Chest: Bilateral basal crackles CVS: S1,S2 RRR. No murmurs  Abd: No distention, soft, non-tender. No  masses palpable  Extr: Bilateral pedal edema MSK: No joint deformities or swelling  Neurological: Grossly intact.    Data Reviewed:     Latest Ref Rng & Units 01/15/2024    2:53 AM 01/14/2024    2:37 PM 09/01/2023   10:05 PM  CBC  WBC 4.0 - 10.5 K/uL 6.6  7.3  7.2   Hemoglobin 12.0 - 15.0 g/dL 88.1  87.4  87.1   Hematocrit 36.0 - 46.0 % 37.9  40.2  39.4   Platelets 150 - 400 K/uL 180  179  237       Latest Ref Rng & Units 01/16/2024    2:47 AM 01/15/2024    2:53 AM 01/14/2024    2:37 PM  BMP  Glucose 70 - 99 mg/dL 887  897  95   BUN 8 - 23 mg/dL 19  20  21    Creatinine 0.44 - 1.00 mg/dL 8.59  8.53  8.46   Sodium 135 - 145 mmol/L 140  141  142   Potassium 3.5 - 5.1 mmol/L 3.7  3.3  4.3   Chloride 98 - 111 mmol/L 103  104  103   CO2 22 - 32 mmol/L 26  27  24    Calcium  8.9 - 10.3 mg/dL 8.6  8.7  9.5      Family Communication: Spoke with daughter at bedside  Disposition: Status is: Inpatient.    Planned Discharge Destination: Home DVT ppx: Systemic anticoagulation with  Xarelto .    Time spent: 35 minutes  Author: MDALA-GAUSI, Markeem Noreen AGATHA, MD 01/16/2024 12:39 PM  For on call review [email or web address].

## 2024-01-16 NOTE — Plan of Care (Signed)

## 2024-01-17 DIAGNOSIS — I5033 Acute on chronic diastolic (congestive) heart failure: Secondary | ICD-10-CM | POA: Diagnosis not present

## 2024-01-17 LAB — MAGNESIUM: Magnesium: 2 mg/dL (ref 1.7–2.4)

## 2024-01-17 LAB — CBC
HCT: 37.6 % (ref 36.0–46.0)
Hemoglobin: 11.5 g/dL — ABNORMAL LOW (ref 12.0–15.0)
MCH: 30.3 pg (ref 26.0–34.0)
MCHC: 30.6 g/dL (ref 30.0–36.0)
MCV: 98.9 fL (ref 80.0–100.0)
Platelets: 159 K/uL (ref 150–400)
RBC: 3.8 MIL/uL — ABNORMAL LOW (ref 3.87–5.11)
RDW: 17 % — ABNORMAL HIGH (ref 11.5–15.5)
WBC: 6.8 K/uL (ref 4.0–10.5)
nRBC: 0 % (ref 0.0–0.2)

## 2024-01-17 LAB — BASIC METABOLIC PANEL WITH GFR
Anion gap: 10 (ref 5–15)
BUN: 19 mg/dL (ref 8–23)
CO2: 29 mmol/L (ref 22–32)
Calcium: 8.6 mg/dL — ABNORMAL LOW (ref 8.9–10.3)
Chloride: 103 mmol/L (ref 98–111)
Creatinine, Ser: 1.33 mg/dL — ABNORMAL HIGH (ref 0.44–1.00)
GFR, Estimated: 38 mL/min — ABNORMAL LOW (ref >60)
Glucose, Bld: 90 mg/dL (ref 70–99)
Potassium: 3.8 mmol/L (ref 3.5–5.1)
Sodium: 142 mmol/L (ref 135–145)

## 2024-01-17 MED ORDER — HYDROCORTISONE 1 % EX CREA: TOPICAL_CREAM | Freq: Four times a day (QID) | CUTANEOUS | Status: DC | PRN

## 2024-01-17 NOTE — Progress Notes (Signed)
 Progress Note   Patient: Shannon [name] FMW:[id number] DOB: Sep 16, 1930 DOA: 01/14/2024     2 DOS: the patient was seen and examined on 01/17/2024   Brief hospital course: Shannon [name] is a 89 y.o. female with medical history significant for permanent atrial fibrillation on Xarelto , chronic HFpEF, CKD stage IIIb, HTN, HLD who is admitted with acute on chronic HFpEF.  Assessment and Plan:  Acute on chronic HFpEF: Patient presenting with dyspnea, orthopnea, lower extremity edema.  CXR consistent with pulmonary edema and mild pleural effusions.  proBNP 2973. TTE 08/19/2023 showed EF 60-65%, mild concentric LVH severely dilated atria. - Continue IV Lasix  40 mg twice daily - Continue pindolol , Isordil , hydralazine  - Continue Jardiance  - Heart failure team consulted.  Hypokalemia - Replete as needed.   Permanent atrial fibrillation: Remains in atrial fibrillation, controlled rate.   -Continue pindolol  and Xarelto .   CKD stage IIIb: Renal function stable.   - Monitor with diuresis.   Hypertension: - Continue hydralazine , Isordil , pindolol .  Deconditioning Family states patient has been struggling in ADLs. She does have family around her at home. Patient has been seen by PT and OT. - For home health PT and OT at discharge.      Subjective: Patient is feeling all right.  She remains volume overloaded.  Creatinine is improving. She was seen by physical therapy and Occupational Therapy.  HH PT/OT recommended.  Physical Exam: Vitals:   01/17/24 0356 01/17/24 0804 01/17/24 0825 01/17/24 0826  BP:  134/62  (!) 128/55  Pulse:  79  81  Resp:  17 17 17   Temp:  98 F (36.7 C)  98 F (36.7 C)  TempSrc:  Oral  Oral  SpO2:  95%  95%  Weight: 98.8 kg     Height:       Physical Exam   General: Alert, oriented X3  Eyes: Pupils equal, reactive  Oral cavity: moist mucous membranes  Head: Atraumatic, normocephalic  Neck: supple  Chest: Bilateral basal crackles CVS: S1,S2 RRR.  No murmurs  Abd: No distention, soft, non-tender. No masses palpable  Extr: Bilateral pedal edema MSK: No joint deformities or swelling  Neurological: Grossly intact.    Data Reviewed:     Latest Ref Rng & Units 01/17/2024    2:59 AM 01/15/2024    2:53 AM 01/14/2024    2:37 PM  CBC  WBC 4.0 - 10.5 K/uL 6.8  6.6  7.3   Hemoglobin 12.0 - 15.0 g/dL 88.4  88.1  87.4   Hematocrit 36.0 - 46.0 % 37.6  37.9  40.2   Platelets 150 - 400 K/uL 159  180  179       Latest Ref Rng & Units 01/17/2024    2:59 AM 01/16/2024    2:47 AM 01/15/2024    2:53 AM  BMP  Glucose 70 - 99 mg/dL 90  887  897   BUN 8 - 23 mg/dL 19  19  20    Creatinine 0.44 - 1.00 mg/dL 8.66  8.59  8.53   Sodium 135 - 145 mmol/L 142  140  141   Potassium 3.5 - 5.1 mmol/L 3.8  3.7  3.3   Chloride 98 - 111 mmol/L 103  103  104   CO2 22 - 32 mmol/L 29  26  27    Calcium  8.9 - 10.3 mg/dL 8.6  8.6  8.7      Family Communication: N/A  Disposition: Status is: Inpatient.    Planned Discharge Destination: Home  DVT ppx: Systemic anticoagulation with Xarelto .    Time spent: 35 minutes  Author: MDALA-GAUSI, Shannon Rorrer AGATHA, MD 01/17/2024 1:40 PM  For on call review [email or web address].

## 2024-01-17 NOTE — Evaluation (Signed)
 Physical Therapy Evaluation Patient Details Name: Shannon [name] MRN: [id number] DOB: 1931/05/29 Today's Date: 01/17/2024  History of Present Illness  The pt is a 89yo female presenting 8/1 with dyspnea, orthopnea, lower extremity edema. CXR consistent with pulmonary edema and mild pleural effusion, admitted with acute on chronic HRpEF. PMHx: permanent atrial fibrillation on Xarelto , chronic HFpEF, CKD stage IIIb, HTN, HLD   Clinical Impression  Pt in bed upon arrival of PT, agreeable to evaluation at this time. Prior to admission the pt was mobilizing with use of RW, she reports largely sedentary in recliner throughout the day at home, and assist from family for sit-stand transfers. The pt required modA to power up to standing despite cues for use of UE and momentum, pt unable to clear hips from chair without assist. The pt was limited to ~12 ft ambulation with RW and chair follow due to rapid fatigue. BP stable, SpO2 to 88% on RA after exertion and pt required 2L to recover to 90s. The pt will continue to benefit from skilled PT acutely and after d/c, recommend DME below to improve safety with mobility at home as well as HHPT to progress functional strength and endurance after return home with family assist.     If plan is discharge home, recommend the following: A little help with walking and/or transfers;A little help with bathing/dressing/bathroom;Assistance with cooking/housework;Direct supervision/assist for medications management;Direct supervision/assist for financial management;Assist for transportation;Help with stairs or ramp for entrance;Supervision due to cognitive status   Can travel by private vehicle        Equipment Recommendations Wheelchair (measurements PT);Wheelchair cushion (measurements PT);BSC/3in1  Recommendations for Other Services       Functional Status Assessment Patient has had a recent decline in their functional status and demonstrates the ability to make  significant improvements in function in a reasonable and predictable amount of time.     Precautions / Restrictions Precautions Precautions: Fall Recall of Precautions/Restrictions: Intact Precaution/Restrictions Comments: watch O2, on 2L Restrictions Weight Bearing Restrictions Per Provider Order: No      Mobility  Bed Mobility               General bed mobility comments: pt OOB upon arrival    Transfers Overall transfer level: Needs assistance Equipment used: Rolling walker (2 wheels) Transfers: Sit to/from Stand, Bed to chair/wheelchair/BSC Sit to Stand: Mod assist   Step pivot transfers: Contact guard assist       General transfer comment: poor power in LE and UE, even with use of BUE on armrests unable to rise without modA. attempted to encourage use of momentum with little impact    Ambulation/Gait Ambulation/Gait assistance: Contact guard assist Gait Distance (Feet): 12 Feet Assistive device: Rolling walker (2 wheels) Gait Pattern/deviations: Step-through pattern, Decreased stride length, Trunk flexed, Shuffle Gait velocity: decreased Gait velocity interpretation: <1.31 ft/sec, indicative of household ambulator   General Gait Details: pt with small steps, minimal clearance and heavy dependence on UE. no buckling but poor endurance. reports fatigued, SpO2 to 88% on RA after exertion, placed on 2L to recover to 90s     Balance Overall balance assessment: Needs assistance Sitting-balance support: Feet supported Sitting balance-Leahy Scale: Good     Standing balance support: Bilateral upper extremity supported, During functional activity Standing balance-Leahy Scale: Poor Standing balance comment: heavy dependence on BUE support  Pertinent Vitals/Pain Pain Assessment Pain Assessment: No/denies pain    Home Living Family/patient expects to be discharged to:: Private residence Living Arrangements: Children;Other  relatives Available Help at Discharge: Family;Available 24 hours/day Type of Home: House Home Access: Level entry       Home Layout: One level Home Equipment: Agricultural consultant (2 wheels);Cane - single point;Shower seat;Grab bars - tub/shower;Wheelchair - manual Additional Comments: no O2 at baseline    Prior Function Prior Level of Function : Independent/Modified Independent             Mobility Comments: RW, denies falls. reports limited activity, assist to stand from chair at home. spends entire day in recliner ADLs Comments: pt reports family present to assist with IADLs     Extremity/Trunk Assessment   Upper Extremity Assessment Upper Extremity Assessment: Defer to OT evaluation    Lower Extremity Assessment Lower Extremity Assessment: Generalized weakness (grossly 4-/5 to MMT)    Cervical / Trunk Assessment Cervical / Trunk Assessment: Kyphotic (mild)  Communication   Communication Communication: Impaired Factors Affecting Communication: Hearing impaired    Cognition Arousal: Alert Behavior During Therapy: Flat affect   PT - Cognitive impairments: Problem solving, Safety/Judgement, Initiation                       PT - Cognition Comments: no family present to confirm baseline, pt able to follow cues but intermittently with self-limiting behaviors Following commands: Impaired Following commands impaired: Only follows one step commands consistently     Cueing Cueing Techniques: Verbal cues     General Comments General comments (skin integrity, edema, etc.): BP stable, SpO2 to 88% after gait on RA, returned to 2L with SpO2 97%    Exercises     Assessment/Plan    PT Assessment Patient needs continued PT services  PT Problem List Cardiopulmonary status limiting activity;Decreased strength;Decreased activity tolerance;Decreased balance;Decreased mobility       PT Treatment Interventions DME instruction;Gait training;Stair training;Functional  mobility training;Therapeutic activities;Therapeutic exercise;Balance training;Patient/family education    PT Goals (Current goals can be found in the Care Plan section)  Acute Rehab PT Goals Patient Stated Goal: return home, be able to transfer on her own PT Goal Formulation: With patient Time For Goal Achievement: 01/31/24 Potential to Achieve Goals: Good    Frequency Min 2X/week        AM-PAC PT 6 Clicks Mobility  Outcome Measure Help needed turning from your back to your side while in a flat bed without using bedrails?: A Little Help needed moving from lying on your back to sitting on the side of a flat bed without using bedrails?: A Little Help needed moving to and from a bed to a chair (including a wheelchair)?: A Lot Help needed standing up from a chair using your arms (e.g., wheelchair or bedside chair)?: A Lot Help needed to walk in hospital room?: Total (<20 ft) Help needed climbing 3-5 steps with a railing? : Total 6 Click Score: 12    End of Session Equipment Utilized During Treatment: Gait belt;Oxygen Activity Tolerance: Patient tolerated treatment well;Patient limited by fatigue Patient left: in chair;with call bell/phone within reach;with chair alarm set Nurse Communication: Mobility status PT Visit Diagnosis: Unsteadiness on feet (R26.81);Other abnormalities of gait and mobility (R26.89);Muscle weakness (generalized) (M62.81)    Time: 9164-9099 PT Time Calculation (min) (ACUTE ONLY): 25 min   Charges:   PT Evaluation $PT Eval Low Complexity: 1 Low PT Treatments $Therapeutic Exercise: 8-22 mins PT General Charges $$  ACUTE PT VISIT: 1 Visit         Izetta Call, PT, DPT   Acute Rehabilitation Department Office [phone] Secure Chat Communication Preferred  Izetta JULIANNA Call 01/17/2024, 10:25 AM

## 2024-01-17 NOTE — Progress Notes (Signed)
 Mobility Specialist Progress Note:    01/17/24 1440  Mobility  Activity Pivoted/transferred from chair to bed  Level of Assistance Moderate assist, patient does 50-74%  Assistive Device Front wheel walker  Distance Ambulated (ft) 3 ft  Activity Response Tolerated poorly  Mobility Referral Yes  Mobility visit 1 Mobility  Mobility Specialist Start Time (ACUTE ONLY) 1440  Mobility Specialist Stop Time (ACUTE ONLY) 1450  Mobility Specialist Time Calculation (min) (ACUTE ONLY) 10 min   Received pt from recliner. Pt c/o of fatigue and minor pain. Pt needing total mod to max to stand using RW. Once on feet, pt able to take small steps to get back to the bed. Pt able to stand for >3 mins to allow NT to clean. Left pt in bed w/ all needs met and daughter in room.   Shannon [name] Mobility Specialist Please Neurosurgeon or Rehab Office at [phone]

## 2024-01-17 NOTE — Progress Notes (Signed)
 CN rounded on patient after oxygen saturation maintained 86-88% while CN was assessing the monitor at the nurses station. Patient asleep with daughter at bedside. CN placed 2L [place].

## 2024-01-17 NOTE — Progress Notes (Signed)
    Durable Medical Equipment  (From admission, onward)           Start     Ordered   01/17/24 1821  For home use only DME lightweight manual wheelchair with seat cushion  Once       Comments: Patient suffers from weakness which impairs their ability to perform daily activities like bathing, dressing, grooming, and toileting in the home.  A cane or walker will not resolve  issue with performing activities of daily living. A wheelchair will allow patient to safely perform daily activities. Patient is not able to propel themselves in the home using a standard weight wheelchair due to general weakness. Patient can self propel in the lightweight wheelchair. Length of need Lifetime. Accessories: elevating leg rests (ELRs), wheel locks, extensions and anti-tippers.   01/17/24 1821   01/17/24 1820  For home use only DME Bedside commode  Once       Question:  Patient needs a bedside commode to treat with the following condition  Answer:  Weakness   01/17/24 1820

## 2024-01-17 NOTE — Evaluation (Signed)
 Occupational Therapy Evaluation Patient Details Name: Shannon [name] MRN: [id number] DOB: 03/01/1931 Today's Date: 01/17/2024   History of Present Illness   Shannon [name] is a 89 yo female  presenting with dyspnea, orthopnea, lower extremity edema. CXR consistent with pulmonary edema and mild pleural effusion, admitted with acute on chronic HRpEF. PMHx: permanent atrial fibrillation on Xarelto , chronic HFpEF, CKD stage IIIb, HTN, HLD     Clinical Impressions [name] was evaluated s/p the above admission list. She is mod I with RW at baseline, her daughter lives with her and she has other supportive family members nearby. Upon evaluation the pt was limited by generalized weakness, decreased activity tolerance, new O2 demand and unsteady gait. Overall she needed mod A for bed mobility, min A to stand from the EOB and CGA to take pivotal steps with the RW. Pt on RA during transfer, SpO2 dropped to 89%, recovered to 92% with PLB. 3L O2 returned at the end of the session, RN notified. Due to the deficits listed below the pt also needs up to mod A for LB ADLs and set up A for UB ADLs in sitting. Pt will benefit from continued acute OT services and HHOT with increased support from family as needed.      If plan is discharge home, recommend the following:   A little help with walking and/or transfers;A lot of help with bathing/dressing/bathroom;Assistance with cooking/housework;Assist for transportation;Help with stairs or ramp for entrance     Functional Status Assessment   Patient has had a recent decline in their functional status and demonstrates the ability to make significant improvements in function in a reasonable and predictable amount of time.     Equipment Recommendations   None recommended by OT      Precautions/Restrictions   Precautions Precautions: Fall Recall of Precautions/Restrictions: Intact Precaution/Restrictions Comments: watch O2 Restrictions Weight  Bearing Restrictions Per Provider Order: No     Mobility Bed Mobility Overal bed mobility: Needs Assistance Bed Mobility: Supine to Sit     Supine to sit: Mod assist          Transfers Overall transfer level: Needs assistance Equipment used: Rolling walker (2 wheels) Transfers: Sit to/from Stand, Bed to chair/wheelchair/BSC Sit to Stand: Min assist     Step pivot transfers: Contact guard assist     General transfer comment: Min A to power up, CGA for pivotal stepping      Balance Overall balance assessment: Needs assistance Sitting-balance support: Feet supported Sitting balance-Leahy Scale: Good     Standing balance support: Bilateral upper extremity supported, During functional activity Standing balance-Leahy Scale: Poor                             ADL either performed or assessed with clinical judgement   ADL Overall ADL's : Needs assistance/impaired Eating/Feeding: Independent   Grooming: Set up;Sitting   Upper Body Bathing: Set up;Sitting   Lower Body Bathing: Moderate assistance;Sit to/from stand   Upper Body Dressing : Set up;Sitting   Lower Body Dressing: Moderate assistance;Sit to/from stand   Toilet Transfer: Contact guard assist;Ambulation;Rolling walker (2 wheels);BSC/3in1   Toileting- Clothing Manipulation and Hygiene: Moderate assistance       Functional mobility during ADLs: Contact guard assist;Rolling walker (2 wheels) General ADL Comments: increased assist needed for LB ADLs due to generalized weakness and SOB     Vision Baseline Vision/History: 0 No visual deficits Vision Assessment?: No apparent visual deficits  Perception Perception: Not tested       Praxis Praxis: Not tested       Pertinent Vitals/Pain Pain Assessment Pain Assessment: No/denies pain     Extremity/Trunk Assessment Upper Extremity Assessment Upper Extremity Assessment: Generalized weakness   Lower Extremity Assessment Lower  Extremity Assessment: Defer to PT evaluation   Cervical / Trunk Assessment Cervical / Trunk Assessment: Kyphotic (mild)   Communication Communication Communication: Impaired Factors Affecting Communication: Hearing impaired   Cognition Arousal: Alert Behavior During Therapy: Flat affect Cognition: No apparent impairments             OT - Cognition Comments: A&O, followed simple 1 step commands. HOH                 Following commands: Impaired Following commands impaired: Only follows one step commands consistently     Cueing  General Comments   Cueing Techniques: Verbal cues  BP stable. SpO2 dropped to 89% on RA, 3L returned at the end of the session           Home Living Family/patient expects to be discharged to:: Private residence Living Arrangements: Children;Other relatives Available Help at Discharge: Family;Available 24 hours/day Type of Home: House Home Access: Level entry     Home Layout: One level     Bathroom Shower/Tub: Producer, television/film/video: Standard     Home Equipment: Agricultural consultant (2 wheels);Cane - single point;Shower seat;Grab bars - tub/shower;Wheelchair - manual          Prior Functioning/Environment Prior Level of Function : Independent/Modified Independent             Mobility Comments: RW, denies falls ADLs Comments: mod I    OT Problem List: Decreased activity tolerance;Impaired balance (sitting and/or standing);Cardiopulmonary status limiting activity   OT Treatment/Interventions: Self-care/ADL training;Therapeutic exercise;Energy conservation;DME and/or AE instruction;Therapeutic activities;Balance training;Patient/family education      OT Goals(Current goals can be found in the care plan section)   Acute Rehab OT Goals Patient Stated Goal: to feel better OT Goal Formulation: With patient Time For Goal Achievement: 01/31/24 Potential to Achieve Goals: Good ADL Goals Pt Will Perform Lower Body  Dressing: with supervision;sit to/from stand Pt Will Transfer to Toilet: with modified independence;ambulating Additional ADL Goal #1: Pt will complete bed mobility with superivsion A as a precursor to ADLs   OT Frequency:  Min 2X/week       AM-PAC OT 6 Clicks Daily Activity     Outcome Measure Help from another person eating meals?: None Help from another person taking care of personal grooming?: A Little Help from another person toileting, which includes using toliet, bedpan, or urinal?: A Little Help from another person bathing (including washing, rinsing, drying)?: A Lot Help from another person to put on and taking off regular upper body clothing?: A Little Help from another person to put on and taking off regular lower body clothing?: A Lot 6 Click Score: 17   End of Session Equipment Utilized During Treatment: Gait belt;Rolling walker (2 wheels);Oxygen Nurse Communication: Mobility status  Activity Tolerance: Patient tolerated treatment well Patient left: in chair;with call bell/phone within reach;with family/visitor present  OT Visit Diagnosis: Other abnormalities of gait and mobility (R26.89);Unsteadiness on feet (R26.81);Muscle weakness (generalized) (M62.81)                Time: 9189-9170 OT Time Calculation (min): 19 min Charges:  OT General Charges $OT Visit: 1 Visit OT Evaluation $OT Eval Moderate Complexity: 1 Mod  Lucie Kendall, OTR/L Acute Rehabilitation Services Office [phone] Secure Chat Communication Preferred   Lucie JONETTA Kendall 01/17/2024, 8:50 AM

## 2024-01-17 NOTE — Plan of Care (Signed)

## 2024-01-17 NOTE — TOC CM/SW Note (Addendum)
 Transition of Care [facility]) - Inpatient Brief Assessment   Patient Details  Name: Cynthia Cogle MRN: [id number] Date of Birth: Aug 19, 1930  Transition of Care [facility]) CM/SW Contact:    Waddell Barnie Rama, RN Phone Number: 01/17/2024, 6:16 PM   Clinical Narrative: From home with daughter,  has PCP and insurance on file, states has no HH services in place at this time , has walker /cane at home.  States family member will transport them home at [employer] and family is support system, states gets medications from [place] on [address]. [employer] in [place].  Pta self ambulatory with cane/walker.  Patient gives this NCM permission to speak with daughters.  Per pt/ot eval rec HHPT, HHOT.  NCM offered choice, chose [facility], [facility] and [facility].  NCM made referral to [facility] , [place] was able to take referral.  Soc will begin 24 to 48 hrs post dc.  Patient will also need an w/chair and a 3 n 1.  No preference of the agency.    NCM made referral to Jermaine with [employer].     Transition of Care Asessment: Insurance and Status: Insurance coverage has been reviewed Patient has primary care physician: Yes Home environment has been reviewed: home with daughter Prior level of function:: ambulatory with walker/cane Prior/Current Home Services: Current home services (walker/cane) Social Drivers of Health Review: SDOH reviewed no interventions necessary Readmission risk has been reviewed: Yes Transition of care needs: transition of care needs identified, TOC will continue to follow

## 2024-01-18 DIAGNOSIS — I5033 Acute on chronic diastolic (congestive) heart failure: Secondary | ICD-10-CM | POA: Diagnosis not present

## 2024-01-18 LAB — BASIC METABOLIC PANEL WITH GFR
Anion gap: 7 (ref 5–15)
BUN: 24 mg/dL — ABNORMAL HIGH (ref 8–23)
CO2: 31 mmol/L (ref 22–32)
Calcium: 8.5 mg/dL — ABNORMAL LOW (ref 8.9–10.3)
Chloride: 102 mmol/L (ref 98–111)
Creatinine, Ser: 1.45 mg/dL — ABNORMAL HIGH (ref 0.44–1.00)
GFR, Estimated: 34 mL/min — ABNORMAL LOW (ref >60)
Glucose, Bld: 96 mg/dL (ref 70–99)
Potassium: 3.9 mmol/L (ref 3.5–5.1)
Sodium: 140 mmol/L (ref 135–145)

## 2024-01-18 LAB — MAGNESIUM: Magnesium: 2.2 mg/dL (ref 1.7–2.4)

## 2024-01-18 NOTE — Plan of Care (Signed)

## 2024-01-18 NOTE — Progress Notes (Signed)
 Mobility Specialist Progress Note:    01/18/24 1433  Mobility  Activity Dangled on edge of bed;Stood at bedside (3x STS, 5x Leg Curl, Ext.)  Level of Assistance Minimal assist, patient does 75% or more  Assistive Device Front wheel walker  Activity Response Tolerated fair  Mobility Referral Yes  Mobility visit 1 Mobility  Mobility Specialist Start Time (ACUTE ONLY) 1433  Mobility Specialist Stop Time (ACUTE ONLY) 1440  Mobility Specialist Time Calculation (min) (ACUTE ONLY) 7 min   Pt in bed tired but willing to do bed mobility. Requiring MinA to sup>sit and sit>stand. C/o fatigue. Left pt in bed w/all needs met and family in room.   Venetia Keel Mobility Specialist Please Neurosurgeon or Rehab Office at [phone]

## 2024-01-18 NOTE — Progress Notes (Signed)
 Progress Note   Patient: Shannon [name] FMW:[id number] DOB: 1930-08-27 DOA: 01/14/2024     3 DOS: the patient was seen and examined on 01/18/2024   Brief hospital course: Shannon [name] is a 89 y.o. female with medical history significant for permanent atrial fibrillation on Xarelto , chronic HFpEF, CKD stage IIIb, HTN, HLD who is admitted with acute on chronic HFpEF.  Assessment and Plan:  Acute on chronic HFpEF: Patient presenting with dyspnea, orthopnea, lower extremity edema.  CXR consistent with pulmonary edema and mild pleural effusions.  proBNP 2973. TTE 08/19/2023 showed EF 60-65%, mild concentric LVH severely dilated atria. - Continue IV Lasix  40 mg twice daily - Continue pindolol , Isordil , hydralazine  - Continue Jardiance  - Heart failure team consulted.  Hypoxia Due to CHF exacerbation. - Continue supplemental oxygen. - Wean off oxygen as tolerated. - For ambulatory oxygen testing in AM.  Hypokalemia - Replete as needed.   Permanent atrial fibrillation: Remains in atrial fibrillation, controlled rate.   -Continue pindolol  and Xarelto .   CKD stage IIIb: Renal function stable.   - Monitor with diuresis.   Hypertension: - Continue hydralazine , Isordil , pindolol .  Deconditioning Family states patient has been struggling in ADLs. She does have family around her at home. Patient has been seen by PT and OT. - For home health PT and OT at discharge.      Subjective: Patient continues to make progress.  Still has some pedal edema, and desaturated overnight.   Physical Exam: Vitals:   01/18/24 0015 01/18/24 0421 01/18/24 0728 01/18/24 1105  BP: (!) 109/48 (!) 121/53 (!) 119/57 (!) 114/57  Pulse: 73 99 67 75  Resp: 18 20 18 20   Temp: 98.3 F (36.8 C) 98.3 F (36.8 C) 98.3 F (36.8 C) 97.9 F (36.6 C)  TempSrc: Oral Oral Oral Oral  SpO2: 97% 97% 100% 99%  Weight:  98 kg    Height:       Physical Exam   General: Alert, oriented X3  Eyes: Pupils equal,  reactive  Oral cavity: moist mucous membranes  Head: Atraumatic, normocephalic  Neck: supple  Chest: Faint basal crackles CVS: S1,S2 RRR. No murmurs  Abd: No distention, soft, non-tender. No masses palpable  Extr: Bilateral pedal edema, improving MSK: No joint deformities or swelling  Neurological: Grossly intact.    Data Reviewed:     Latest Ref Rng & Units 01/17/2024    2:59 AM 01/15/2024    2:53 AM 01/14/2024    2:37 PM  CBC  WBC 4.0 - 10.5 K/uL 6.8  6.6  7.3   Hemoglobin 12.0 - 15.0 g/dL 88.4  88.1  87.4   Hematocrit 36.0 - 46.0 % 37.6  37.9  40.2   Platelets 150 - 400 K/uL 159  180  179       Latest Ref Rng & Units 01/18/2024    2:58 AM 01/17/2024    2:59 AM 01/16/2024    2:47 AM  BMP  Glucose 70 - 99 mg/dL 96  90  887   BUN 8 - 23 mg/dL 24  19  19    Creatinine 0.44 - 1.00 mg/dL 8.54  8.66  8.59   Sodium 135 - 145 mmol/L 140  142  140   Potassium 3.5 - 5.1 mmol/L 3.9  3.8  3.7   Chloride 98 - 111 mmol/L 102  103  103   CO2 22 - 32 mmol/L 31  29  26    Calcium  8.9 - 10.3 mg/dL 8.5  8.6  8.6      Family Communication: N/A  Disposition: Status is: Inpatient.    Planned Discharge Destination: Home DVT ppx: Systemic anticoagulation with Xarelto .    Time spent: 35 minutes  Author: MDALA-GAUSI, Shannon Brar AGATHA, MD 01/18/2024 12:57 PM  For on call review [email or web address].

## 2024-01-19 DIAGNOSIS — I5033 Acute on chronic diastolic (congestive) heart failure: Secondary | ICD-10-CM | POA: Diagnosis not present

## 2024-01-19 LAB — BASIC METABOLIC PANEL WITH GFR
Anion gap: 9 (ref 5–15)
BUN: 29 mg/dL — ABNORMAL HIGH (ref 8–23)
CO2: 29 mmol/L (ref 22–32)
Calcium: 8.7 mg/dL — ABNORMAL LOW (ref 8.9–10.3)
Chloride: 102 mmol/L (ref 98–111)
Creatinine, Ser: 1.49 mg/dL — ABNORMAL HIGH (ref 0.44–1.00)
GFR, Estimated: 33 mL/min — ABNORMAL LOW (ref >60)
Glucose, Bld: 96 mg/dL (ref 70–99)
Potassium: 4 mmol/L (ref 3.5–5.1)
Sodium: 140 mmol/L (ref 135–145)

## 2024-01-19 LAB — MAGNESIUM: Magnesium: 2.1 mg/dL (ref 1.7–2.4)

## 2024-01-19 NOTE — Plan of Care (Signed)

## 2024-01-19 NOTE — Discharge Summary (Signed)
 Physician Discharge Summary   Patient: Shannon [name] MRN: [id number] DOB: 19-Mar-1931  Admit date:     01/14/2024  Discharge date: 01/19/24  Discharge Physician: MDALA-GAUSI, GOLDEN PILLOW   PCP: Verdia Lombard, MD   Recommendations at discharge:   Follow-up with cardiology and with PCP  Discharge Diagnoses: Principal Problem:   Acute on chronic heart failure with preserved ejection fraction (HFpEF, >= 50%) (HCC) Active Problems:   Permanent atrial fibrillation (HCC)   Hypertension   Chronic kidney disease, stage 3b (HCC)   CHF exacerbation [facility])  Hospital Course:  89 y.o. woman with PMH of permanent atrial fibrillation on Xarelto , chronic HFpEF, CKD stage IIIb, HTN, HLD who was admitted with acute on chronic HFpEF.   Assessment and Plan:  Acute on chronic HFpEF: Patient presented with dyspnea, orthopnea, lower extremity edema.  CXR consistent with pulmonary edema and mild pleural effusions.  proBNP 2973. TTE 08/19/2023 showed EF 60-65%, mild concentric LVH severely dilated atria. She was diuresed with IV Lasix . Home pindolol , Isordil , hydralazine  and Jardiance  were continued. Her volume status improved and she was close to euvolemia by the day of discharge. Home torsemide  was resumed at discharge.   Hypoxia Due to CHF exacerbation. She required 2 L/min supplemental oxygen. She was able to be weaned off oxygen prior to discharge. She underwent ambulatory oxygen testing prior to discharge and did not qualify for home oxygen.   Hypokalemia Potassium was repleted as needed.   Permanent atrial fibrillation: Home pindolol  and Xarelto  were continued.   CKD stage IIIb Renal function stable.   Creatinine remained at baseline of approximately 1.4.   Hypertension Continued hydralazine , Isordil , pindolol .   Deconditioning Family states patient has been struggling in ADLs. She does have family around her at home. Patient was seen by PT and OT. For home health PT and  OT at discharge.       Consultants: n/a Procedures performed: n/a  Disposition: Home Diet recommendation:  Discharge Diet Orders (From admission, onward)     Start     Ordered   01/19/24 0000  Diet - low sodium heart healthy        01/19/24 1330           Cardiac diet DISCHARGE MEDICATION: Allergies as of 01/19/2024       Reactions   Amlodipine  Swelling   Intolerance, LE edema    Zestoretic [lisinopril-hydrochlorothiazide] Itching, Rash        Medication List     TAKE these medications    empagliflozin  10 MG Tabs tablet Commonly known as: Jardiance  Take 1 tablet (10 mg total) by mouth daily.   gabapentin  100 MG capsule Commonly known as: NEURONTIN  Take 100 mg by mouth at bedtime.   hydrALAZINE  100 MG tablet Commonly known as: APRESOLINE  Take 100 mg by mouth 3 (three) times daily.   isosorbide  dinitrate 30 MG tablet Commonly known as: ISORDIL  TAKE 1 TABLET(30 MG) BY MOUTH THREE TIMES DAILY   pindolol  5 MG tablet Commonly known as: VISKEN  TAKE 1 TABLET(5 MG) BY MOUTH TWICE DAILY   Potassium Chloride  ER 20 MEQ Tbcr Take 1 tablet by mouth daily.   torsemide  20 MG tablet Commonly known as: DEMADEX  TAKE 1 TABLET(20 MG) BY MOUTH TWICE DAILY AT 10 AM AND AT 4 PM   Vitamin D (Ergocalciferol) 1.25 MG (50000 UNIT) Caps capsule Commonly known as: DRISDOL Take 50,000 Units by mouth once a week.   Xarelto  15 MG Tabs tablet Generic drug: Rivaroxaban  TAKE 1 TABLET BY MOUTH  EVERY EVENING AFTER DINNER               Durable Medical Equipment  (From admission, onward)           Start     Ordered   01/17/24 1821  For home use only DME lightweight manual wheelchair with seat cushion  Once       Comments: Patient suffers from weakness which impairs their ability to perform daily activities like bathing, dressing, grooming, and toileting in the home.  A cane or walker will not resolve  issue with performing activities of daily living. A wheelchair will  allow patient to safely perform daily activities. Patient is not able to propel themselves in the home using a standard weight wheelchair due to general weakness. Patient can self propel in the lightweight wheelchair. Length of need Lifetime. Accessories: elevating leg rests (ELRs), wheel locks, extensions and anti-tippers.   01/17/24 1821   01/17/24 1820  For home use only DME Bedside commode  Once       Question:  Patient needs a bedside commode to treat with the following condition  Answer:  Weakness   01/17/24 1820            Follow-up Information     [facility] Follow up.   Why: Agency will call you to set up apt times Contact information: [address] [place] [address] [phone]         [facility] Follow up.   Why: light weight w/chair and 3 n 1 Contact information: [phone]               Discharge Exam: Filed Weights   01/17/24 0356 01/18/24 0421 01/19/24 0436  Weight: 98.8 kg 98 kg 97.5 kg   Physical Exam on Day of Discharge   General: Alert, cheerful, oriented X3  Oral cavity: moist mucous membranes  Neck: supple  Chest: diminished breath sounds CVS: S1,S2 RRR. No murmurs  Abd: No distention, soft, non-tender. No masses palpable  Extr: Trace pedal edema    Condition at discharge: stable  The results of significant diagnostics from this hospitalization (including imaging, microbiology, ancillary and laboratory) are listed below for reference.   Imaging Studies: DG Chest Port 1 View Result Date: 01/14/2024 CLINICAL DATA:  Shortness of breath. EXAM: PORTABLE CHEST 1 VIEW COMPARISON:  11/14/2021. FINDINGS: Low lung volume. There is mild-to-moderate pulmonary vascular congestion with hilar and lower lobe predominance. There are bilateral small-to-moderate layering pleural effusions, left more than right. There are probable atelectatic changes at the lung bases. Stable mildly enlarged cardio-mediastinal silhouette. No acute  osseous abnormalities. The soft tissues are within normal limits. IMPRESSION: Findings favor congestive heart failure/pulmonary edema. Electronically Signed   By: Ree Molt M.D.   On: 01/14/2024 14:46    Microbiology: Results for orders placed or performed during the hospital encounter of 07/07/20  SARS Coronavirus 2 by RT PCR (hospital order, performed in [facility] hospital lab) Nasopharyngeal Nasopharyngeal Swab     Status: Abnormal   Collection Time: 07/07/20  8:16 AM   Specimen: Nasopharyngeal Swab  Result Value Ref Range Status   SARS Coronavirus 2 POSITIVE (A) NEGATIVE Final    Comment: RESULT CALLED TO, READ BACK BY AND VERIFIED WITH: RN DARICE SAILOR [phone] MLM (NOTE) SARS-CoV-2 target nucleic acids are DETECTED  SARS-CoV-2 RNA is generally detectable in upper respiratory specimens  during the acute phase of infection.  Positive results are indicative  of the presence  of the identified virus, but do not rule out bacterial infection or co-infection with other pathogens not detected by the test.  Clinical correlation with patient history and  other diagnostic information is necessary to determine patient infection status.  The expected result is negative.  Fact Sheet for Patients:   BoilerBrush.com.cy   Fact Sheet for Healthcare Providers:   https://pope.com/    This test is not yet approved or cleared by the [place]  FDA and  has been authorized for detection and/or diagnosis of SARS-CoV-2 by FDA under an Emergency Use Authorization (EUA).  This EUA will remain in effect (meaning this test can b e used) for the duration of  the COVID-19 declaration under Section 564(b)(1) of the Act, 21 U.S.C. section 360-bbb-3(b)(1), unless the authorization is terminated or revoked sooner.  Performed at [facility], [address], [place], [place] [address]   Blood Culture (routine x 2)     Status: None   Collection  Time: 07/07/20 10:19 AM   Specimen: BLOOD  Result Value Ref Range Status   Specimen Description BLOOD BLOOD RIGHT FOREARM  Final   Special Requests   Final    BOTTLES DRAWN AEROBIC AND ANAEROBIC Blood Culture results may not be optimal due to an inadequate volume of blood received in culture bottles   Culture   Final    NO GROWTH 5 DAYS Performed at [facility], [address], [place], [place] [address]    Report Status 07/12/2020 FINAL  Final  Blood Culture (routine x 2)     Status: None   Collection Time: 07/07/20 10:24 AM   Specimen: BLOOD  Result Value Ref Range Status   Specimen Description BLOOD LEFT ANTECUBITAL  Final   Special Requests   Final    BOTTLES DRAWN AEROBIC AND ANAEROBIC Blood Culture adequate volume   Culture   Final    NO GROWTH 5 DAYS Performed at [facility], [address], [place], [place] [address]    Report Status 07/12/2020 FINAL  Final    Labs: CBC: Recent Labs  Lab 01/14/24 1437 01/15/24 0253 01/17/24 0259  WBC 7.3 6.6 6.8  NEUTROABS 4.7  --   --   HGB 12.5 11.8* 11.5*  HCT 40.2 37.9 37.6  MCV 96.4 96.7 98.9  PLT 179 180 159   Basic Metabolic Panel: Recent Labs  Lab 01/15/24 0253 01/16/24 0247 01/17/24 0259 01/18/24 0258 01/19/24 0222  NA 141 140 142 140 140  K 3.3* 3.7 3.8 3.9 4.0  CL 104 103 103 102 102  CO2 27 26 29 31 29   GLUCOSE 102* 112* 90 96 96  BUN 20 19 19  24* 29*  CREATININE 1.46* 1.40* 1.33* 1.45* 1.49*  CALCIUM  8.7* 8.6* 8.6* 8.5* 8.7*  MG 2.1 2.1 2.0 2.2 2.1   Liver Function Tests: Recent Labs  Lab 01/14/24 1437  AST 28  ALT <5  ALKPHOS 84  BILITOT 1.8*  PROT 7.6  ALBUMIN 3.6   CBG: No results for input(s): GLUCAP in the last 168 hours.  Discharge time spent: greater than 30 minutes.  Signed: MDALA-GAUSI, Shannon [name] AGATHA, MD [facility] 01/19/2024

## 2024-01-19 NOTE — Progress Notes (Signed)
 Mobility Specialist Progress Note:    01/19/24 1055  Mobility  Activity Stood at bedside;Pivoted/transferred from bed to chair  Level of Assistance Minimal assist, patient does 75% or more  Assistive Device Front wheel walker  Distance Ambulated (ft) 3 ft  Activity Response Tolerated fair  Mobility Referral Yes  Mobility visit 1 Mobility  Mobility Specialist Start Time (ACUTE ONLY) 1055  Mobility Specialist Stop Time (ACUTE ONLY) 1114  Mobility Specialist Time Calculation (min) (ACUTE ONLY) 19 min   Pt agreeable to session. Pt req' minA to sup>sit, scoot to EOB, and stand w/ RW. Once up, pt able to take steps over to chair. Pt able to sit herself in a controlled manor. Pt left in recliner on pillow to be elevated w/ all needs met.   Venetia Keel Mobility Specialist Please Neurosurgeon or Rehab Office at [phone]

## 2024-01-19 NOTE — Progress Notes (Signed)
 Physical Therapy Treatment Patient Details Name: Shannon [name] MRN: [id number] DOB: [birth year] Today's Date: 01/19/2024   History of Present Illness The pt is a 89yo female presenting 8/1 with dyspnea, orthopnea, lower extremity edema. CXR consistent with pulmonary edema and mild pleural effusion, admitted with acute on chronic HRpEF. PMHx: permanent atrial fibrillation on Xarelto , chronic HFpEF, CKD stage IIIb, HTN, HLD    PT Comments  Pt agreeable to session with focus on gait progression and activity tolerance. She was able to progress ambulation distance, but benefits from CGA and close chair follow due to poor endurance and fatigue. SpO2 90% on RA initially after exertion, low of 89% from which pt recovered to 94% after cues for PLB and 3 min seated rest. Pt then challenged by repeated sit-stand from recliner, continued poor power in LE and UE to rise without min-modA, but with repeated transfers pt demos improved carryover of cues and was able to complete with minA. Recommendations remain appropriate, will continue to follow acutely to progress strength and endurance prior to anticipated return home with family assist.    If plan is discharge home, recommend the following: A little help with walking and/or transfers;A little help with bathing/dressing/bathroom;Assistance with cooking/housework;Direct supervision/assist for medications management;Direct supervision/assist for financial management;Assist for transportation;Help with stairs or ramp for entrance;Supervision due to cognitive status   Can travel by private vehicle        Equipment Recommendations  Wheelchair (measurements PT);Wheelchair cushion (measurements PT);BSC/3in1    Recommendations for Other Services       Precautions / Restrictions Precautions Precautions: Fall Recall of Precautions/Restrictions: Intact Precaution/Restrictions Comments: watch O2, on RA Restrictions Weight Bearing Restrictions Per Provider  Order: No     Mobility  Bed Mobility               General bed mobility comments: pt OOB upon arrival    Transfers Overall transfer level: Needs assistance Equipment used: Rolling walker (2 wheels) Transfers: Sit to/from Stand, Bed to chair/wheelchair/BSC Sit to Stand: Mod assist, Min assist   Step pivot transfers: Contact guard assist       General transfer comment: poor power in LE and UE, even with use of BUE on armrests. cues for continued use of momentum, when completing x5 in a row, pt with improved carryover of UE placement and progressed to minA    Ambulation/Gait Ambulation/Gait assistance: Contact guard assist Gait Distance (Feet): 18 Feet Assistive device: Rolling walker (2 wheels) Gait Pattern/deviations: Step-through pattern, Decreased stride length, Trunk flexed, Shuffle Gait velocity: decreased Gait velocity interpretation: <1.31 ft/sec, indicative of household ambulator   General Gait Details: pt with small steps, minimal clearance and heavy dependence on UE. no buckling but poor endurance. reports fatigued, SpO2 to 90% initially after exertion, low of 89% which pt recovered to 94% after cues for PLB and 3 min seated rest.     Balance Overall balance assessment: Needs assistance Sitting-balance support: Feet supported Sitting balance-Leahy Scale: Good     Standing balance support: Bilateral upper extremity supported, During functional activity Standing balance-Leahy Scale: Poor Standing balance comment: heavy dependence on BUE support                            Communication Communication Communication: Impaired Factors Affecting Communication: Hearing impaired  Cognition Arousal: Alert Behavior During Therapy: Flat affect   PT - Cognitive impairments: Problem solving, Safety/Judgement, Initiation  PT - Cognition Comments: no family present to confirm baseline, pt able to follow cues but  intermittently with self-limiting behaviors Following commands: Impaired Following commands impaired: Only follows one step commands consistently    Cueing Cueing Techniques: Verbal cues  Exercises Other Exercises Other Exercises: 5x sit-stand with minA    General Comments General comments (skin integrity, edema, etc.): SpO2 96% on RA at start of session, low of 89% after gait which recovered with seated rest and cues for PLB       PT Goals (current goals can now be found in the care plan section) Acute Rehab PT Goals Patient Stated Goal: return home, be able to transfer on her own PT Goal Formulation: With patient Time For Goal Achievement: 01/31/24 Potential to Achieve Goals: Good Progress towards PT goals: Progressing toward goals    Frequency    Min 2X/week       AM-PAC PT 6 Clicks Mobility   Outcome Measure  Help needed turning from your back to your side while in a flat bed without using bedrails?: A Little Help needed moving from lying on your back to sitting on the side of a flat bed without using bedrails?: A Little Help needed moving to and from a bed to a chair (including a wheelchair)?: A Lot Help needed standing up from a chair using your arms (e.g., wheelchair or bedside chair)?: A Lot Help needed to walk in hospital room?: Total (<20 ft) Help needed climbing 3-5 steps with a railing? : Total 6 Click Score: 12    End of Session Equipment Utilized During Treatment: Gait belt;Oxygen Activity Tolerance: Patient tolerated treatment well;Patient limited by fatigue Patient left: in chair;with call bell/phone within reach;with chair alarm set Nurse Communication: Mobility status PT Visit Diagnosis: Unsteadiness on feet (R26.81);Other abnormalities of gait and mobility (R26.89);Muscle weakness (generalized) (M62.81)     Time: 8757-8692 PT Time Calculation (min) (ACUTE ONLY): 25 min  Charges:    $Gait Training: 8-22 mins $Therapeutic Exercise: 8-22  mins PT General Charges $$ ACUTE PT VISIT: 1 Visit                     Izetta Call, PT, DPT   Acute Rehabilitation Department Office [phone] Secure Chat Communication Preferred   Izetta JULIANNA Call 01/19/2024, 1:21 PM

## 2024-01-19 NOTE — Care Management Important Message (Signed)
 Important Message  Patient Details  Name: Shannon [name] MRN: [id number] Date of Birth: 1930/10/03   Important Message Given:  Yes - Medicare IM     Vonzell Arrie Sharps 01/19/2024, 11:25 AM

## 2024-01-19 NOTE — Progress Notes (Signed)
 Heart Failure Navigator Progress Note  Assessed for Heart & Vascular TOC clinic readiness.  Patient does not meet criteria due to has a scheduled [facility] appointment on 02/09/2024. No HF TOC . SABRA   Navigator  will sign off at this time.   Stephane Haddock, BSN, Scientist, clinical (histocompatibility and immunogenetics) Only

## 2024-01-19 NOTE — TOC Transition Note (Signed)
 Transition of Care [facility]) - Discharge Note   Patient Details  Name: Shannon [name] MRN: [id number] Date of Birth: 1930/11/21  [facility] CM/SW Contact:  Waddell Barnie Rama, RN Phone Number: 01/19/2024, 1:38 PM   Clinical Narrative:    For dc today, she has the w/chair at bedside and 3 n 1,  NCM notified Cheryl with [facility] of the dc.  Patient has transport home.          Patient Goals and CMS Choice            Discharge Placement                       Discharge Plan and Services Additional resources added to the After Visit Summary for                                       Social Drivers of Health (SDOH) Interventions SDOH Screenings   Food Insecurity: No Food Insecurity (01/14/2024)  Housing: Low Risk  (01/14/2024)  Transportation Needs: No Transportation Needs (01/14/2024)  Utilities: Not At Risk (01/14/2024)  Social Connections: Unknown (01/14/2024)  Tobacco Use: Medium Risk (01/14/2024)     Readmission Risk Interventions    01/17/2024    6:12 PM  Readmission Risk Prevention Plan  Medication Screening Complete  Transportation Screening Complete

## 2024-01-20 ENCOUNTER — Encounter (HOSPITAL_COMMUNITY): Payer: Self-pay

## 2024-01-20 ENCOUNTER — Emergency Department (HOSPITAL_COMMUNITY)

## 2024-01-20 ENCOUNTER — Telehealth: Payer: Self-pay

## 2024-01-20 ENCOUNTER — Other Ambulatory Visit: Payer: Self-pay

## 2024-01-20 ENCOUNTER — Emergency Department (HOSPITAL_COMMUNITY)
Admission: EM | Admit: 2024-01-20 | Discharge: 2024-01-20 | Disposition: A | Discharge status: Home / Self Care | Attending: Emergency Medicine | Admitting: Emergency Medicine

## 2024-01-20 DIAGNOSIS — J90 Pleural effusion, not elsewhere classified: Secondary | ICD-10-CM | POA: Diagnosis not present

## 2024-01-20 DIAGNOSIS — I4891 Unspecified atrial fibrillation: Secondary | ICD-10-CM | POA: Insufficient documentation

## 2024-01-20 DIAGNOSIS — R0602 Shortness of breath: Secondary | ICD-10-CM | POA: Diagnosis not present

## 2024-01-20 DIAGNOSIS — I130 Hypertensive heart and chronic kidney disease with heart failure and stage 1 through stage 4 chronic kidney disease, or unspecified chronic kidney disease: Secondary | ICD-10-CM | POA: Insufficient documentation

## 2024-01-20 DIAGNOSIS — R0989 Other specified symptoms and signs involving the circulatory and respiratory systems: Secondary | ICD-10-CM | POA: Diagnosis not present

## 2024-01-20 DIAGNOSIS — Z79899 Other long term (current) drug therapy: Secondary | ICD-10-CM | POA: Diagnosis not present

## 2024-01-20 DIAGNOSIS — R609 Edema, unspecified: Secondary | ICD-10-CM | POA: Diagnosis not present

## 2024-01-20 DIAGNOSIS — I5032 Chronic diastolic (congestive) heart failure: Secondary | ICD-10-CM | POA: Diagnosis not present

## 2024-01-20 DIAGNOSIS — N1832 Chronic kidney disease, stage 3b: Secondary | ICD-10-CM | POA: Insufficient documentation

## 2024-01-20 DIAGNOSIS — Z7901 Long term (current) use of anticoagulants: Secondary | ICD-10-CM | POA: Diagnosis not present

## 2024-01-20 DIAGNOSIS — R531 Weakness: Secondary | ICD-10-CM | POA: Diagnosis not present

## 2024-01-20 DIAGNOSIS — I517 Cardiomegaly: Secondary | ICD-10-CM | POA: Diagnosis not present

## 2024-01-20 LAB — CBC
HCT: 42.5 % (ref 36.0–46.0)
Hemoglobin: 12.8 g/dL (ref 12.0–15.0)
MCH: 30.1 pg (ref 26.0–34.0)
MCHC: 30.1 g/dL (ref 30.0–36.0)
MCV: 100 fL (ref 80.0–100.0)
Platelets: 190 K/uL (ref 150–400)
RBC: 4.25 MIL/uL (ref 3.87–5.11)
RDW: 17 % — ABNORMAL HIGH (ref 11.5–15.5)
WBC: 6.2 K/uL (ref 4.0–10.5)
nRBC: 0 % (ref 0.0–0.2)

## 2024-01-20 LAB — BASIC METABOLIC PANEL WITH GFR
Anion gap: 11 (ref 5–15)
BUN: 24 mg/dL — ABNORMAL HIGH (ref 8–23)
CO2: 27 mmol/L (ref 22–32)
Calcium: 8.9 mg/dL (ref 8.9–10.3)
Chloride: 100 mmol/L (ref 98–111)
Creatinine, Ser: 1.34 mg/dL — ABNORMAL HIGH (ref 0.44–1.00)
GFR, Estimated: 37 mL/min — ABNORMAL LOW (ref >60)
Glucose, Bld: 115 mg/dL — ABNORMAL HIGH (ref 70–99)
Potassium: 4.4 mmol/L (ref 3.5–5.1)
Sodium: 138 mmol/L (ref 135–145)

## 2024-01-20 NOTE — Transitions of Care (Post Inpatient/ED Visit) (Signed)
 01/20/2024  Name: Shannon [name] MRN: [id number] DOB: 03/10/1931  Today's TOC FU Call Status: Today's TOC FU Call Status:: Successful TOC FU Call Completed TOC FU Call Complete Date: 01/19/24 Patient's Name and Date of Birth confirmed.  Transition Care Management Follow-up Telephone Call Date of Discharge: 01/19/24 Discharge Facility: [facility]) Type of Discharge: Inpatient Admission Primary Inpatient Discharge Diagnosis:: Acute on chronic heart failure with preserved ejection fraction How have you been since you were released from the hospital?: Better  Items Reviewed: Did you receive and understand the discharge instructions provided?: Yes Medications obtained,verified, and reconciled?: Yes (Medications Reviewed) Any new allergies since your discharge?: No Dietary orders reviewed?: Yes Type of Diet Ordered:: Low sodium heart healthy Do you have support at home?: Yes People in Home [RPT]: child(ren), adult, spouse Name of Support/Comfort Primary Source: Benita-daughter  Medications Reviewed Today: Medications Reviewed Today     Reviewed by Lindey Renzulli, RN (Case Manager) on 01/20/24 at 1039  Med List Status: <None>   Medication Order Taking? Sig Documenting Provider Last Dose Status Informant  empagliflozin  (JARDIANCE ) 10 MG TABS tablet [id number] Yes Take 1 tablet (10 mg total) by mouth daily. Ladona Heinz, MD  Active Self, Child  gabapentin  (NEURONTIN ) 100 MG capsule [id number] Yes Take 100 mg by mouth at bedtime. [provider]  Active Self, Child  hydrALAZINE  (APRESOLINE ) 100 MG tablet [id number] Yes Take 100 mg by mouth 3 (three) times daily. [provider]  Active Self, Child           Med Note ISA, CECILIA   Thu Mar 05, 2022 11:02 AM)    isosorbide  dinitrate (ISORDIL ) 30 MG tablet [id number] Yes TAKE 1 TABLET(30 MG) BY MOUTH THREE TIMES DAILY Ladona Heinz, MD  Active Self, Child  pindolol  (VISKEN ) 5 MG tablet [id number] Yes TAKE 1 TABLET(5 MG) BY  MOUTH TWICE DAILY Ladona Heinz, MD  Active Self, Child           Med Note ISA, CECILIA   Thu Mar 05, 2022 11:03 AM)    Potassium Chloride  ER 20 MEQ TBCR [id number] Yes Take 1 tablet by mouth daily. [provider]  Active Self, Child  torsemide  (DEMADEX ) 20 MG tablet [id number] Yes TAKE 1 TABLET(20 MG) BY MOUTH TWICE DAILY AT 10 AM AND AT 4 PM Ladona Heinz, MD  Active Self, Child  Vitamin D, Ergocalciferol, (DRISDOL) 1.25 MG (50000 UNIT) CAPS capsule [id number] Yes Take 50,000 Units by mouth once a week. [provider]  Active Self, Child           Med Note (SATTERFIELD, TEENA BRAVO   Sun Jan 16, 2024  5:59 PM) No specific day   XARELTO  15 MG TABS tablet [id number] Yes TAKE 1 TABLET BY MOUTH EVERY EVENING AFTER DINNER Ganji, Jay, MD  Active Self, Child           Med Note (SATTERFIELD, TEENA BRAVO   Sun Jan 16, 2024  6:00 PM) @1800             Home Care and Equipment/Supplies: Were Home Health Services Ordered?: Yes Name of Home Health Agency:: [facility] Has Agency set up a time to come to your home?: No EMR reviewed for Home Health Orders:  (daughter has contact information for [employer])  Functional Questionnaire: Do you need assistance with bathing/showering or dressing?: Yes (daughter assists with ADL's) Do you need assistance with meal preparation?: Yes Do you need assistance with eating?: No Do you have difficulty maintaining continence: Yes (wears  pads) Do you need assistance with getting out of bed/getting out of a chair/moving?: Yes (family assists) Do you have difficulty managing or taking your medications?: Yes (benita manages medication)  Follow up appointments reviewed: PCP Follow-up appointment confirmed?: No (daughter Hardin to call) MD Provider Line [phone] Given: No Specialist Hospital Follow-up appointment confirmed?: Yes Date of Specialist follow-up appointment?: 02/09/24 Follow-Up Specialty Provider:: Callie Goodrich Do you need transportation  to your follow-up appointment?: No Do you understand care options if your condition(s) worsen?: Yes-patient verbalized understanding (Report to your doctor weight gain of 2 -3 pounds in a day or 5 pounds in a week  Please weight daily  Limit salt intake  Monitor for shortness of breath, swelling of feet, ankles or abdomen and weight gain.  Never use the saltshaker.)  SDOH Interventions Today    Flowsheet Row Most Recent Value  SDOH Interventions   Food Insecurity Interventions Intervention Not Indicated  Housing Interventions Intervention Not Indicated  Transportation Interventions Intervention Not Indicated  Utilities Interventions Intervention Not Indicated    Chanceler Pullin J. Trice Aspinall RN, MSN [facility]  [facility], [facility] RN Care Manager Direct Dial: [phone]  Fax: [phone] Website: [email or web address]

## 2024-01-20 NOTE — ED Notes (Signed)
 Pt ambulated in room to best of patients ability.  O2 saturation decreased by 1% to a value of 91%.

## 2024-01-20 NOTE — Discharge Instructions (Signed)
 Thank you for allowing us  to care for you today.  During your evaluation we performed a chest x-ray which was stable, laboratory studies which show improvement compared to when you are hospitalized and you have not required oxygen during your evaluation.  At this time we believe you are safe to be discharged home.  Please be sure to follow-up with your primary care provider to ensure improvement in your symptoms.  Please return to the emergency department if you experience shortness of breath, chest pain, passout or believe need emergent medical care.  We hope you feel better soon

## 2024-01-20 NOTE — ED Provider Notes (Signed)
 [facility] Provider Note   CSN: [id number] Arrival date & time: 01/20/24  1746     Patient presents with: SOB and Weakness   Shannon [name] is a 89 y.o. female PMH of permanent atrial fibrillation on Xarelto , chronic HFpEF, CKD stage IIIb, HTN, HLD who was admitted with acute on chronic HFpEF recently and discharged yesterday, 8/6 presenting today with family concerns for shortness of breath.  Patient states that she was feeling well at discharge however had an episode of shortness of breath that concerned her family.  Family is at bedside and states that they are concerned that she needs to be placed on supplemental oxygen.  Patient denies current shortness of breath, chest pain, abdominal pain, nausea, vomiting.  Patient denies associated fevers.  Patient states that she feels much better than when she was in the hospital.    Weakness      Prior to Admission medications   Medication Sig Start Date End Date Taking? Authorizing Provider  empagliflozin  (JARDIANCE ) 10 MG TABS tablet Take 1 tablet (10 mg total) by mouth daily. 06/30/23   Ladona Heinz, MD  gabapentin  (NEURONTIN ) 100 MG capsule Take 100 mg by mouth at bedtime. 12/28/23   [provider]  hydrALAZINE  (APRESOLINE ) 100 MG tablet Take 100 mg by mouth 3 (three) times daily. 05/27/20   [provider]  isosorbide  dinitrate (ISORDIL ) 30 MG tablet TAKE 1 TABLET(30 MG) BY MOUTH THREE TIMES DAILY 04/08/22   Ladona Heinz, MD  pindolol  (VISKEN ) 5 MG tablet TAKE 1 TABLET(5 MG) BY MOUTH TWICE DAILY 03/25/20   Ladona Heinz, MD  Potassium Chloride  ER 20 MEQ TBCR Take 1 tablet by mouth daily. 04/21/22   [provider]  torsemide  (DEMADEX ) 20 MG tablet TAKE 1 TABLET(20 MG) BY MOUTH TWICE DAILY AT 10 AM AND AT 4 PM 09/20/23   Ladona Heinz, MD  Vitamin D, Ergocalciferol, (DRISDOL) 1.25 MG (50000 UNIT) CAPS capsule Take 50,000 Units by mouth once a week. 12/28/23   [provider]  XARELTO  15 MG TABS tablet TAKE 1 TABLET BY MOUTH EVERY EVENING AFTER DINNER 12/01/19   Ladona Heinz, MD    Allergies: Amlodipine  and Zestoretic [lisinopril-hydrochlorothiazide]    Review of Systems  Neurological:  Positive for weakness.    Updated Vital Signs BP (!) 170/81   Pulse 90   Temp 98.1 F (36.7 C)   Resp (!) 30   SpO2 91%   Physical Exam Vitals reviewed.  Constitutional:      General: She is not in acute distress.    Appearance: She is not ill-appearing.  HENT:     Head: Normocephalic and atraumatic.  Cardiovascular:     Rate and Rhythm: Normal rate and regular rhythm.     Pulses:          Dorsalis pedis pulses are 2+ on the right side and 2+ on the left side.     Heart sounds: Normal heart sounds. No murmur heard. Pulmonary:     Effort: Pulmonary effort is normal. No tachypnea.     Breath sounds: Normal breath sounds. No decreased breath sounds, wheezing, rhonchi or rales.  Abdominal:     General: There is no distension.     Palpations: Abdomen is soft.     Tenderness: There is no abdominal tenderness.  Musculoskeletal:     Right lower leg: No edema.     Left lower leg: No edema.     Comments: Spontaneous movement  of bilateral upper and lower extremity  Neurological:     Mental Status: She is alert.  Psychiatric:        Behavior: Behavior is cooperative.     (all labs ordered are listed, but only abnormal results are displayed) Labs Reviewed  BASIC METABOLIC PANEL WITH GFR - Abnormal; Notable for the following components:      Result Value   Glucose, Bld 115 (*)    BUN 24 (*)    Creatinine, Ser 1.34 (*)    GFR, Estimated 37 (*)    All other components within normal limits  CBC - Abnormal; Notable for the following components:   RDW 17.0 (*)    All other components within normal limits    EKG: EKG Interpretation Date/Time:  Thursday January 20 2024 17:57:50 EDT Ventricular Rate:  87 PR Interval:    QRS Duration:  78 QT  Interval:  420 QTC Calculation: 505 R Axis:   125  Text Interpretation: Atrial fibrillation with premature ventricular or aberrantly conducted complexes Low voltage QRS Possible Anterolateral infarct , age undetermined Abnormal ECG No significant change since last tracing Confirmed by Emil Share [id number]) on 01/20/2024 6:24:10 PM  Radiology: [facility] Chest 2 View Result Date: 01/20/2024 CLINICAL DATA:  Shortness of breath. EXAM: CHEST - 2 VIEW COMPARISON:  Chest radiograph dated 01/14/2024. FINDINGS: Bilateral pleural effusions, left greater than right with associated bibasilar atelectasis. Pneumonia is not excluded. No pneumothorax. There is cardiomegaly with vascular congestion. Atherosclerotic calcification of the aorta. No acute osseous pathology. IMPRESSION: 1. Cardiomegaly with vascular congestion. 2. Bilateral pleural effusions, left greater than right. Electronically Signed   By: Vanetta Chou M.D.   On: 01/20/2024 18:32     Procedures   Medications Ordered in the ED - No data to display  Clinical Course as of 01/20/24 2314  Thu Jan 20, 2024  1838 EKG reviewed by me: Atrial fibrillation without evidence of acute ischemic change [AG]    Clinical Course User Index [AG] Nada Chroman, DO                                 Medical Decision Making Amount and/or Complexity of Data Reviewed Labs: ordered. Radiology: ordered.   On initial evaluation patient is hemodynamically stable, afebrile and not in acute distress.  Patient was initially placed on 4 L nasal cannula however this was removed as patient saturating 100%.  Patient saturating greater than 96% on room air.  Based upon patient's history differential diagnosis include HFpEF exacerbation, pneumonia, pneumothorax, pulmonary embolism, anemia.  On reevaluation patient continues to be saturating well on room air with no evidence of tachypnea or respiratory distress.  Laboratory studies with improvement compared to initial studies  performed at admission and discharge.  Chest x-ray without evidence of pneumothorax, pneumonia and is stable compared to prior chest x-ray.  Patient's presentation less consistent with pulmonary embolism as patient is saturating well on room air, is hemodynamically stable without tachycardia and has no evidence of right heart strain on EKG without clinical signs of DVT.  Patient is ambulatory without episodes of hypoxia.  At this time believe patient is safe to be discharged home as she remains hemodynamically stable not in acute respiratory distress and without need for supplemental oxygen.  Patient and patient's family members were updated, given strict return precautions and recommended to follow-up in the outpatient setting.  Patient agrees with and understood plan of care at  time of discharge     Final diagnoses:  Shortness of breath    ED Discharge Orders     None       Lavanda Bolster DO Emergency Medicine PGY2   Bolster Lavanda, DO 01/20/24 2314    Emil Share, DO 01/20/24 2315

## 2024-01-20 NOTE — ED Triage Notes (Signed)
 Pt BIB [facility] from home d/t exertional SOB, weakness & unable to care for herself since d/c from hospital yesterday d/t CHF exacerbation & pulmonary edema. EMS reports LS clear, 95% on RA & 100% on 2L O2 via n/c, 181//103, 90 bpm, 20 resp,137 cbg, 12L good.

## 2024-01-20 NOTE — Patient Instructions (Signed)
 Visit Information  Thank you for taking time to visit with me today. Please don't hesitate to contact me if I can be of assistance to you before our next scheduled telephone appointment.  Our next appointment is by telephone on 01/28/24 at 1100 am  Following is a copy of your care plan:   Goals Addressed             This Visit's Progress    VBCI Transitions of Care (TOC) Care Plan       Problems:  Recent Hospitalization for treatment of CHF Home Health services barrier: Home health has not called but daughter will call if no call within 24-48 hours, Knowledge Deficit Related to heart failure, and No Hospital Follow Up Provider appointment daughter states she will call PCP today for follow up appointment  Goal:  Over the next 30 days, the patient will not experience hospital readmission  Interventions:  Transitions of Care: Doctor Visits  - discussed the importance of doctor visits  Heart Failure Interventions: Provided education on low sodium diet Assessed need for readable accurate scales in home Provided education about placing scale on hard, flat surface Advised patient to weigh each morning after emptying bladder Discussed importance of daily weight and advised patient to weigh and record daily Discussed the importance of keeping all appointments with provider   Patient Self Care Activities:  Call pharmacy for medication refills 3-7 days in advance of running out of medications Call provider office for new concerns or questions  Notify RN Care Manager of TOC call rescheduling needs Participate in Transition of Care Program/Attend TOC scheduled calls Take medications as prescribed   call office if I gain more than 2 pounds in one day or 5 pounds in one week keep legs up while sitting track weight in diary use salt in moderation watch for swelling in feet, ankles and legs every day weigh myself daily begin a heart failure diary  Plan:  The patient has been provided  with contact information for the care management team and has been advised to call with any health related questions or concerns.         The patient verbalized understanding of instructions, educational materials, and care plan provided today and DECLINED offer to receive copy of patient instructions, educational materials, and care plan.   The patient has been provided with contact information for the care management team and has been advised to call with any health related questions or concerns.   Please call the care guide team at [phone] if you need to cancel or reschedule your appointment.   Please call the Suicide and Crisis Lifeline: 988 if you are experiencing a Mental Health or Behavioral Health Crisis or need someone to talk to.  Latandra Loureiro J. Addasyn Mcbreen RN, MSN [facility], [facility] RN Care Manager Direct Dial: [phone]  Fax: [phone] Website: [email or web address]

## 2024-01-24 DIAGNOSIS — R0602 Shortness of breath: Secondary | ICD-10-CM | POA: Diagnosis not present

## 2024-01-28 ENCOUNTER — Other Ambulatory Visit: Payer: Self-pay

## 2024-01-28 NOTE — Patient Instructions (Signed)
 Visit Information  Thank you for taking time to visit with me today. Please don't hesitate to contact me if I can be of assistance to you before our next scheduled telephone appointment.  Our next appointment is by telephone on 02/04/24 at 1100  Following is a copy of your care plan:   Goals Addressed             This Visit's Progress    VBCI Transitions of Care (TOC) Care Plan       Problems:  Recent Hospitalization for treatment of CHF Knowledge Deficit Related to heart failure PCP appointment set for 8/26  Goal:  Over the next 30 days, the patient will not experience hospital readmission  Interventions:  Transitions of Care: Doctor Visits  - discussed the importance of doctor visits  Heart Failure Interventions: Provided education on low sodium diet Provided education about placing scale on hard, flat surface Advised patient to weigh each morning after emptying bladder Discussed importance of daily weight and advised patient to weigh and record daily Discussed the importance of keeping all appointments with provider Patient weight down to 201.4 lbs Discussed also upcoming cardiology appointment 8/27  Patient Self Care Activities:  Call pharmacy for medication refills 3-7 days in advance of running out of medications Call provider office for new concerns or questions  Notify RN Care Manager of TOC call rescheduling needs Participate in Transition of Care Program/Attend TOC scheduled calls Take medications as prescribed   call office if I gain more than 2 pounds in one day or 5 pounds in one week keep legs up while sitting track weight in diary use salt in moderation watch for swelling in feet, ankles and legs every day weigh myself daily begin a heart failure diary  Plan:  The patient has been provided with contact information for the care management team and has been advised to call with any health related questions or concerns.         The patient  verbalized understanding of instructions, educational materials, and care plan provided today and DECLINED offer to receive copy of patient instructions, educational materials, and care plan.   The patient has been provided with contact information for the care management team and has been advised to call with any health related questions or concerns.   Please call the care guide team at [phone] if you need to cancel or reschedule your appointment.   Please call the Suicide and Crisis Lifeline: 988 if you are experiencing a Mental Health or Behavioral Health Crisis or need someone to talk to.  Noela Brothers J. Mayjor Ager RN, MSN [facility], [facility] RN Care Manager Direct Dial: [phone]  Fax: [phone] Website: [email or web address]

## 2024-01-28 NOTE — Transitions of Care (Post Inpatient/ED Visit) (Signed)
 Transition of Care week 2  Visit Note  01/28/2024  Name: Shannon [name] MRN: [id number]          DOB: 06-12-1931  Situation: Patient enrolled in [facility] 30-day program. Visit completed with daughter Hardin by telephone.   Background:   Initial Transition Care Management Follow-up Telephone Call    Past Medical History:  Diagnosis Date   Acute exacerbation of CHF (congestive heart failure) (HCC) 09/24/2017   Arthritis    all over   Atrial fibrillation (HCC)    Chronic diastolic CHF (congestive heart failure) (HCC) 09/24/2017   Chronic kidney disease    Dyspnea    Gout    Hypercholesteremia    Hypertension    Mitral regurgitation     Assessment: Patient Reported Symptoms: Cognitive Cognitive Status: Alert and oriented to person, place, and time (some forgetfulness at time per daughter)      Neurological Neurological Review of Symptoms: No symptoms reported    HEENT HEENT Symptoms Reported: No symptoms reported      Cardiovascular Cardiovascular Symptoms Reported: No symptoms reported Weight: 201 lb 6.4 oz (91.4 kg) Cardiovascular Self-Management Outcome: 4 (good)  Respiratory Respiratory Symptoms Reported: Shortness of breath Other Respiratory Symptoms: Patient did have an episode of Shortness of breath on 8/7 and went to ED. nothing found and shortness of breath resolved. Patient has been doing great since that ED visit.    Endocrine Endocrine Symptoms Reported: No symptoms reported Is patient diabetic?: No    Gastrointestinal Gastrointestinal Symptoms Reported: No symptoms reported      Genitourinary Genitourinary Symptoms Reported: Incontinence Genitourinary Management Strategies: Incontinence garment/pad  Integumentary Integumentary Symptoms Reported: No symptoms reported    Musculoskeletal Musculoskelatal Symptoms Reviewed: Weakness Additional Musculoskeletal Details: Uses walker active with PT Musculoskeletal Management Strategies:  Exercise Musculoskeletal Self-Management Outcome: 4 (good)      Psychosocial           There were no vitals filed for this visit.  Medications Reviewed Today     Reviewed by Lizza Huffaker, RN (Case Manager) on 01/28/24 at 1213  Med List Status: <None>   Medication Order Taking? Sig Documenting Provider Last Dose Status Informant  empagliflozin  (JARDIANCE ) 10 MG TABS tablet [id number] Yes Take 1 tablet (10 mg total) by mouth daily. Ladona Heinz, MD  Active Self, Child  gabapentin  (NEURONTIN ) 100 MG capsule [id number] Yes Take 100 mg by mouth at bedtime. [provider]  Active Self, Child  hydrALAZINE  (APRESOLINE ) 100 MG tablet [id number] Yes Take 100 mg by mouth 3 (three) times daily. [provider]  Active Self, Child           Med Note ISA, CECILIA   Thu Mar 05, 2022 11:02 AM)    isosorbide  dinitrate (ISORDIL ) 30 MG tablet [id number] Yes TAKE 1 TABLET(30 MG) BY MOUTH THREE TIMES DAILY Ganji, Jay, MD  Active Self, Child  pindolol  (VISKEN ) 5 MG tablet [id number] Yes TAKE 1 TABLET(5 MG) BY MOUTH TWICE DAILY Ladona Heinz, MD  Active Self, Child           Med Note ISA, CECILIA   Thu Mar 05, 2022 11:03 AM)    Potassium Chloride  ER 20 MEQ TBCR [id number] Yes Take 1 tablet by mouth daily. [provider]  Active Self, Child  torsemide  (DEMADEX ) 20 MG tablet [id number] Yes TAKE 1 TABLET(20 MG) BY MOUTH TWICE DAILY AT 10 AM AND AT 4 PM Ladona Heinz, MD  Active Self, Child  Vitamin D, Ergocalciferol, (DRISDOL) 1.25 MG (50000 UNIT)  CAPS capsule [id number] Yes Take 50,000 Units by mouth once a week. [provider]  Active Self, Child           Med Note (SATTERFIELD, TEENA BRAVO   Sun Jan 16, 2024  5:59 PM) No specific day   XARELTO  15 MG TABS tablet [id number] Yes TAKE 1 TABLET BY MOUTH EVERY EVENING AFTER JOAQUIN Ladona Heinz, MD  Active Self, Child           Med Note (SATTERFIELD, TEENA BRAVO   Sun Jan 16, 2024  6:00 PM) @1800             Goals Addressed              This Visit's Progress    VBCI Transitions of Care (TOC) Care Plan       Problems:  Recent Hospitalization for treatment of CHF Knowledge Deficit Related to heart failure PCP appointment set for 8/26  Goal:  Over the next 30 days, the patient will not experience hospital readmission  Interventions:  Transitions of Care: Doctor Visits  - discussed the importance of doctor visits  Heart Failure Interventions: Provided education on low sodium diet Provided education about placing scale on hard, flat surface Advised patient to weigh each morning after emptying bladder Discussed importance of daily weight and advised patient to weigh and record daily Discussed the importance of keeping all appointments with provider Patient weight down to 201.4 lbs Discussed also upcoming cardiology appointment 8/27  Patient Self Care Activities:  Call pharmacy for medication refills 3-7 days in advance of running out of medications Call provider office for new concerns or questions  Notify RN Care Manager of TOC call rescheduling needs Participate in Transition of Care Program/Attend TOC scheduled calls Take medications as prescribed   call office if I gain more than 2 pounds in one day or 5 pounds in one week keep legs up while sitting track weight in diary use salt in moderation watch for swelling in feet, ankles and legs every day weigh myself daily begin a heart failure diary  Plan:  The patient has been provided with contact information for the care management team and has been advised to call with any health related questions or concerns.          Recommendation:   Continue Current Plan of Care  Follow Up Plan:   Telephone follow-up in 1 week  Faust Thorington J. Kataya Guimont RN, MSN [facility], [facility] RN Care Manager Direct Dial: [phone]  Fax: [phone] Website: [email or web address]

## 2024-01-31 NOTE — Progress Notes (Deleted)
 Cardiology Office Note:    Date:  01/31/2024   ID:  Shannon [name], DOB 22-Oct-1930, MRN [id number]  PCP:  Verdia Lombard, MD  Cardiologist:  Gordy Bergamo, MD { Click to update primary MD,subspecialty MD or APP then REFRESH:1}    Referring MD: Verdia Lombard, MD   Chief Complaint: hospital follow-up of CHF  History of Present Illness:    Shannon [name] is a 89 y.o. female with a history of chronic HFpEF, permanent atrial fibrillation on Xarelto , frequent PVCs, hypertension, hyperlipidemia, CKD stage III, gout, and obesity who is followed by Dr. Bergamo and presents today for hospital follow-up of CHF.  Patient is primarily followed by Cardiology for CHF and atrial fibrillation. She was seen by Dr. Bergamo on 07/28/2023 at which time she reported worsening edema after a family get together where she consumed more salt. Her weight was up 11 lbs. She had tried to increase her Torsemide  with no significant improvement. Pro-BNP was elevated at 1,463. She was prescribed Metolazone  and instructed to take once daily for 1 week. She was seen by Dr. Bergamo again later that month and was noted to have had significant improvement in edema. Echo in 08/2023 showed LVEF of 60-65% with normal wall motion and mild LVH, normal RV function, severe biatrial enlargement, mild MR, and moderately elevated PASP of 54.2 mmHg.   She was recently admitted from 01/14/2024 to 01/19/2024 for acute on chronic CHF after presenting with dyspnea, orthopnea, and edema. Pro-BNP was elevated at 2,973. Chest x-ray showed pulmonary edema and mild pleural effusions. He was diuresed with IV Lasix  and then transitioned back to home Torsemide  She presented back to the ED on 01/20/2024  for further evaluation of one episode of shortness of breath that concerned her family. Family was concerned that she needed supplemental O2 but she did not meet criteria for this prior to recent discharge. Overall, patient was feeling much better. She was  able to ambulate without episodes of hypoxia and was felt to be stable for discharge.  Patient presents today for follow-up. ***  Chronic HFpEF Recently admitted with acute on chronic CHF earlier this month. Last Echo in 08/2023 showed LVEF of 60-65% with normal wall motion and mild LVH, normal RV function, severe biatrial enlargement, mild MR, and moderately elevated PASP of 54.2 mmHg. She was diuresed with IV Lasix  and then transitioned back to home Torsemide .  - Euvolemic on exam. *** - Continue Torsemide  20mg  twice daily (KCl 20 mEq daily).  - Continue Jardiance  10mg  daily.  - Discussed importance of daily weights and sodium/ fluid restrictions.  - Will repeat BMET today. ***  Permanent Atrial Fibrillation Rate controlled.  - Continue Pindolol  5mg  twice daily.  - Continue Xarelto  15mg  daily. Reduced dose due to CrCl <51.   Of note, QTc was automatically read as 505 ms on EKG from 01/20/2024 However, I got 409 ms when I personally calculated it using the Bazett formula.   Hypertension BP *** - Continue current medications: Pindolol  5mg  twice daily, Hydralazine  100mg  three times daily, and Isordil  30mg  three times daily.    Hyperlipidemia History of hyperlipidemia listed in chart but not on any medications. ***  CKD Stage III Baseline creatinine around 1.3 to 1.5.   EKGs/Labs/Other Studies Reviewed:    The following studies were reviewed:  Echocardiogram 08/19/2023: Impressions:  1. Left ventricular ejection fraction, by estimation, is 60 to 65%. The  left ventricle has normal function. The left ventricle has no regional  wall motion  abnormalities. There is mild concentric left ventricular  hypertrophy. Left ventricular diastolic  function could not be evaluated.   2. Right ventricular systolic function is normal. The right ventricular  size is normal. There is moderately elevated pulmonary artery systolic  pressure. The estimated right ventricular systolic pressure is 54.2  mmHg.   3. Left atrial size was severely dilated.   4. Right atrial size was severely dilated.   5. The mitral valve is normal in structure. Mild mitral valve  regurgitation. No evidence of mitral stenosis.   6. The aortic valve is tricuspid. There is mild calcification of the  aortic valve. Aortic valve regurgitation is trivial. Aortic valve  sclerosis/calcification is present, without any evidence of aortic  stenosis.   7. The inferior vena cava is dilated in size with <50% respiratory  variability, suggesting right atrial pressure of 15 mmHg.    EKG:  EKG not ordered today.  Recent Labs: 01/14/2024: ALT <5; Pro Brain Natriuretic Peptide 2,973.0 01/19/2024: Magnesium  2.1 01/20/2024: BUN 24; Creatinine, Ser 1.34; Hemoglobin 12.8; Platelets 190; Potassium 4.4; Sodium 138  Recent Lipid Panel    Component Value Date/Time   TRIG 91 07/07/2020 1058    Physical Exam:    Vital Signs: There were no vitals taken for this visit.    Wt Readings from Last 3 Encounters:  01/28/24 201 lb 6.4 oz (91.4 kg)  01/19/24 215 lb (97.5 kg)  09/01/23 224 lb 13.9 oz (102 kg)     General: 89 y.o. female in no acute distress. HEENT: Normocephalic and atraumatic. Sclera clear.  Neck: Supple. No carotid bruits. No JVD. Heart: *** RRR. Distinct S1 and S2. No murmurs, gallops, or rubs.  Lungs: No increased work of breathing. Clear to ausculation bilaterally. No wheezes, rhonchi, or rales.  Abdomen: Soft, non-distended, and non-tender to palpation.  Extremities: No lower extremity edema.  Radial and distal pedal pulses 2+ and equal bilaterally. Skin: Warm and dry. Neuro: No focal deficits. Psych: Normal affect. Responds appropriately.   Assessment:    No diagnosis found.  Plan:     Disposition: Follow up in ***   Signed, Kampbell Holaway E Aleia Larocca, PA-C  01/31/2024 4:32 PM    [facility]

## 2024-02-01 DIAGNOSIS — I25118 Atherosclerotic heart disease of native coronary artery with other forms of angina pectoris: Secondary | ICD-10-CM | POA: Diagnosis not present

## 2024-02-01 DIAGNOSIS — E782 Mixed hyperlipidemia: Secondary | ICD-10-CM | POA: Diagnosis not present

## 2024-02-01 DIAGNOSIS — I5033 Acute on chronic diastolic (congestive) heart failure: Secondary | ICD-10-CM | POA: Diagnosis not present

## 2024-02-01 DIAGNOSIS — I130 Hypertensive heart and chronic kidney disease with heart failure and stage 1 through stage 4 chronic kidney disease, or unspecified chronic kidney disease: Secondary | ICD-10-CM | POA: Diagnosis not present

## 2024-02-01 DIAGNOSIS — M6281 Muscle weakness (generalized): Secondary | ICD-10-CM | POA: Diagnosis not present

## 2024-02-01 DIAGNOSIS — I4821 Permanent atrial fibrillation: Secondary | ICD-10-CM | POA: Diagnosis not present

## 2024-02-01 DIAGNOSIS — Z7901 Long term (current) use of anticoagulants: Secondary | ICD-10-CM | POA: Diagnosis not present

## 2024-02-01 DIAGNOSIS — I701 Atherosclerosis of renal artery: Secondary | ICD-10-CM | POA: Diagnosis not present

## 2024-02-01 DIAGNOSIS — N184 Chronic kidney disease, stage 4 (severe): Secondary | ICD-10-CM | POA: Diagnosis not present

## 2024-02-01 DIAGNOSIS — R7303 Prediabetes: Secondary | ICD-10-CM | POA: Diagnosis not present

## 2024-02-01 DIAGNOSIS — R5381 Other malaise: Secondary | ICD-10-CM | POA: Diagnosis not present

## 2024-02-02 DIAGNOSIS — R5381 Other malaise: Secondary | ICD-10-CM | POA: Diagnosis not present

## 2024-02-02 DIAGNOSIS — I5033 Acute on chronic diastolic (congestive) heart failure: Secondary | ICD-10-CM | POA: Diagnosis not present

## 2024-02-02 DIAGNOSIS — I701 Atherosclerosis of renal artery: Secondary | ICD-10-CM | POA: Diagnosis not present

## 2024-02-02 DIAGNOSIS — M6281 Muscle weakness (generalized): Secondary | ICD-10-CM | POA: Diagnosis not present

## 2024-02-02 DIAGNOSIS — Z7901 Long term (current) use of anticoagulants: Secondary | ICD-10-CM | POA: Diagnosis not present

## 2024-02-02 DIAGNOSIS — R7303 Prediabetes: Secondary | ICD-10-CM | POA: Diagnosis not present

## 2024-02-02 DIAGNOSIS — E782 Mixed hyperlipidemia: Secondary | ICD-10-CM | POA: Diagnosis not present

## 2024-02-02 DIAGNOSIS — I4821 Permanent atrial fibrillation: Secondary | ICD-10-CM | POA: Diagnosis not present

## 2024-02-02 DIAGNOSIS — I25118 Atherosclerotic heart disease of native coronary artery with other forms of angina pectoris: Secondary | ICD-10-CM | POA: Diagnosis not present

## 2024-02-02 DIAGNOSIS — N184 Chronic kidney disease, stage 4 (severe): Secondary | ICD-10-CM | POA: Diagnosis not present

## 2024-02-04 ENCOUNTER — Telehealth: Payer: Self-pay

## 2024-02-07 ENCOUNTER — Telehealth: Payer: Self-pay

## 2024-02-07 NOTE — Transitions of Care (Post Inpatient/ED Visit) (Signed)
 Transition of Care week 3  Visit Note  02/07/2024  Name: Shannon [name] MRN: [id number]          DOB: June 05, 1931  Situation: Patient enrolled in [facility] 30-day program. Visit completed with daughter Shannon [name] by telephone.   Background:   Initial Transition Care Management Follow-up Telephone Call    Past Medical History:  Diagnosis Date   Acute exacerbation of CHF (congestive heart failure) (HCC) 09/24/2017   Arthritis    all over   Atrial fibrillation (HCC)    Chronic diastolic CHF (congestive heart failure) (HCC) 09/24/2017   Chronic kidney disease    Dyspnea    Gout    Hypercholesteremia    Hypertension    Mitral regurgitation     Assessment: Patient Reported Symptoms: Cognitive Cognitive Status: Alert and oriented to person, place, and time (some forgetfulness per daughter)      Neurological Neurological Review of Symptoms: No symptoms reported    HEENT HEENT Symptoms Reported: No symptoms reported      Cardiovascular Cardiovascular Symptoms Reported: No symptoms reported Weight: 196 lb (88.9 kg) Cardiovascular Self-Management Outcome: 4 (good)  Respiratory Respiratory Symptoms Reported: No symptoms reported    Endocrine Endocrine Symptoms Reported: No symptoms reported Is patient diabetic?: No    Gastrointestinal Gastrointestinal Symptoms Reported: No symptoms reported      Genitourinary Genitourinary Symptoms Reported: Incontinence Genitourinary Management Strategies: Incontinence garment/pad Genitourinary Self-Management Outcome: 4 (good)  Integumentary      Musculoskeletal Musculoskelatal Symptoms Reviewed: Weakness Additional Musculoskeletal Details: Uses walker PT active Musculoskeletal Management Strategies: Exercise Musculoskeletal Self-Management Outcome: 4 (good)      Psychosocial Psychosocial Symptoms Reported: Not assessed Additional Psychological Details: daughter Shannon [name] completes assessment         There were no vitals filed for this  visit.  Medications Reviewed Today     Reviewed by Lucee Brissett, RN (Case Manager) on 02/07/24 at 1115  Med List Status: <None>   Medication Order Taking? Sig Documenting Provider Last Dose Status Informant  empagliflozin  (JARDIANCE ) 10 MG TABS tablet [id number] Yes Take 1 tablet (10 mg total) by mouth daily. Ladona Heinz, MD  Active Self, Child  gabapentin  (NEURONTIN ) 100 MG capsule [id number] Yes Take 100 mg by mouth at bedtime. [provider]  Active Self, Child  hydrALAZINE  (APRESOLINE ) 100 MG tablet [id number] Yes Take 100 mg by mouth 3 (three) times daily. [provider]  Active Self, Child           Med Note ISA, CECILIA   Thu Mar 05, 2022 11:02 AM)    isosorbide  dinitrate (ISORDIL ) 30 MG tablet [id number] Yes TAKE 1 TABLET(30 MG) BY MOUTH THREE TIMES DAILY Ladona Heinz, MD  Active Self, Child  pindolol  (VISKEN ) 5 MG tablet [id number] Yes TAKE 1 TABLET(5 MG) BY MOUTH TWICE DAILY Ladona Heinz, MD  Active Self, Child           Med Note ISA, CECILIA   Thu Mar 05, 2022 11:03 AM)    Potassium Chloride  ER 20 MEQ TBCR [id number] Yes Take 1 tablet by mouth daily. [provider]  Active Self, Child  torsemide  (DEMADEX ) 20 MG tablet [id number] Yes TAKE 1 TABLET(20 MG) BY MOUTH TWICE DAILY AT 10 AM AND AT 4 PM Ladona Heinz, MD  Active Self, Child  Vitamin D, Ergocalciferol, (DRISDOL) 1.25 MG (50000 UNIT) CAPS capsule [id number] Yes Take 50,000 Units by mouth once a week. [provider]  Active Self, Child  Med Note (SATTERFIELD, TEENA BRAVO   Sun Jan 16, 2024  5:59 PM) No specific day   XARELTO  15 MG TABS tablet [id number] Yes TAKE 1 TABLET BY MOUTH EVERY EVENING AFTER JOAQUIN Ladona Heinz, MD  Active Self, Child           Med Note (SATTERFIELD, TEENA BRAVO   Sun Jan 16, 2024  6:00 PM) @1800             Goals Addressed             This Visit's Progress    VBCI Transitions of Care (TOC) Care Plan       Problems:  Recent Hospitalization for treatment of  CHF Knowledge Deficit Related to heart failure PCP appointment set for 8/26  Goal:  Over the next 30 days, the patient will not experience hospital readmission  Interventions:  Transitions of Care: Doctor Visits  - discussed the importance of doctor visits  Heart Failure Interventions: Provided education on low sodium diet Provided education about placing scale on hard, flat surface Advised patient to weigh each morning after emptying bladder Discussed importance of daily weight and advised patient to weigh and record daily Discussed the importance of keeping all appointments with provider Patient weight down to 196 lbs Discussed also upcoming cardiology appointment 8/27  Patient Self Care Activities:  Call pharmacy for medication refills 3-7 days in advance of running out of medications Call provider office for new concerns or questions  Notify RN Care Manager of TOC call rescheduling needs Participate in Transition of Care Program/Attend TOC scheduled calls Take medications as prescribed   call office if I gain more than 2 pounds in one day or 5 pounds in one week keep legs up while sitting track weight in diary use salt in moderation watch for swelling in feet, ankles and legs every day weigh myself daily begin a heart failure diary  Plan:  The patient has been provided with contact information for the care management team and has been advised to call with any health related questions or concerns.         Recommendation:   Continue Current Plan of Care  Follow Up Plan:   Telephone follow-up in 1 week  Nakeem Murnane J. Barney Russomanno RN, MSN [facility], [facility] RN Care Manager Direct Dial: [phone]  Fax: [phone] Website: [email or web address]

## 2024-02-07 NOTE — Patient Instructions (Signed)
 Visit Information  Thank you for taking time to visit with me today. Please don't hesitate to contact me if I can be of assistance to you before our next scheduled telephone appointment.  Our next appointment is by telephone on 02/15/24 at 1100 am  Following is a copy of your care plan:   Goals Addressed             This Visit's Progress    VBCI Transitions of Care (TOC) Care Plan       Problems:  Recent Hospitalization for treatment of CHF Knowledge Deficit Related to heart failure PCP appointment set for 8/26  Goal:  Over the next 30 days, the patient will not experience hospital readmission  Interventions:  Transitions of Care: Doctor Visits  - discussed the importance of doctor visits  Heart Failure Interventions: Provided education on low sodium diet Provided education about placing scale on hard, flat surface Advised patient to weigh each morning after emptying bladder Discussed importance of daily weight and advised patient to weigh and record daily Discussed the importance of keeping all appointments with provider Patient weight down to 196 lbs Discussed also upcoming cardiology appointment 8/27  Patient Self Care Activities:  Call pharmacy for medication refills 3-7 days in advance of running out of medications Call provider office for new concerns or questions  Notify RN Care Manager of TOC call rescheduling needs Participate in Transition of Care Program/Attend TOC scheduled calls Take medications as prescribed   call office if I gain more than 2 pounds in one day or 5 pounds in one week keep legs up while sitting track weight in diary use salt in moderation watch for swelling in feet, ankles and legs every day weigh myself daily begin a heart failure diary  Plan:  The patient has been provided with contact information for the care management team and has been advised to call with any health related questions or concerns.         Patient verbalizes  understanding of instructions and care plan provided today and agrees to view in MyChart. Active MyChart status and patient understanding of how to access instructions and care plan via MyChart confirmed with patient.     The patient has been provided with contact information for the care management team and has been advised to call with any health related questions or concerns.   Please call the care guide team at [phone] if you need to cancel or reschedule your appointment.   Please call the Suicide and Crisis Lifeline: 988 if you are experiencing a Mental Health or Behavioral Health Crisis or need someone to talk to.  Slayden Mennenga J. Inioluwa Baris RN, MSN [facility], [name] RN Care Manager Direct Dial: [phone]  Fax: [phone] Website: [email or web address]

## 2024-02-08 DIAGNOSIS — Z09 Encounter for follow-up examination after completed treatment for conditions other than malignant neoplasm: Secondary | ICD-10-CM | POA: Diagnosis not present

## 2024-02-08 DIAGNOSIS — I25118 Atherosclerotic heart disease of native coronary artery with other forms of angina pectoris: Secondary | ICD-10-CM | POA: Diagnosis not present

## 2024-02-08 DIAGNOSIS — I480 Paroxysmal atrial fibrillation: Secondary | ICD-10-CM | POA: Diagnosis not present

## 2024-02-08 DIAGNOSIS — R7309 Other abnormal glucose: Secondary | ICD-10-CM | POA: Diagnosis not present

## 2024-02-08 DIAGNOSIS — I701 Atherosclerosis of renal artery: Secondary | ICD-10-CM | POA: Diagnosis not present

## 2024-02-08 DIAGNOSIS — E782 Mixed hyperlipidemia: Secondary | ICD-10-CM | POA: Diagnosis not present

## 2024-02-08 DIAGNOSIS — I5032 Chronic diastolic (congestive) heart failure: Secondary | ICD-10-CM | POA: Diagnosis not present

## 2024-02-08 DIAGNOSIS — N1832 Chronic kidney disease, stage 3b: Secondary | ICD-10-CM | POA: Diagnosis not present

## 2024-02-08 DIAGNOSIS — I130 Hypertensive heart and chronic kidney disease with heart failure and stage 1 through stage 4 chronic kidney disease, or unspecified chronic kidney disease: Secondary | ICD-10-CM | POA: Diagnosis not present

## 2024-02-08 DIAGNOSIS — I10 Essential (primary) hypertension: Secondary | ICD-10-CM | POA: Diagnosis not present

## 2024-02-08 DIAGNOSIS — D6869 Other thrombophilia: Secondary | ICD-10-CM | POA: Diagnosis not present

## 2024-02-08 NOTE — Progress Notes (Addendum)
 Cardiology Office Note:  .   Date:  02/09/2024  ID:  Shannon [name], DOB 1931/03/03, MRN [id number] PCP: Verdia Lombard, MD  [facility] Providers Cardiologist:  Gordy Bergamo, MD {  History of Present Illness: .   Shannon [name] is a 89 y.o. female  with PMHx of permanent A. Fib, hypertension (No RAS by angio in 2016), chronic HFpEF (EF 50-55% in 2019 and 45-50% in 2023 with most recent 60-65% in 08/2023), stage IIIa-b chronic kidney disease, hyperlipidemia, obesity, frequent PVCs by electrocardiogram and history of gout who reports to [facility] office for follow up.   Last seen in heartcare 07/2023 by Dr. Bergamo for follow after recent episode of fluid overload. Reported improvement in LE edema following a period of bed rest and diuretic medications, however still some residual swelling in one leg. D/C metolazone  after 1 week of use to avoid renal dysfunction. Continued on Jardiance  10 mg daily, hydralazine  100 mg TID, Imdur 30mg  daily, Pindolol  5 mg BID, KCL 20 MEQ daily, Xarelto  15 mg daily, and Torsemide  20mg  BID. ECHO was already pending.   ECHO 08/2023 showed EF 60-65%, mild LVH, moderately PASP, severely dilated LA/RA, mild MV regurgitations, trivial AI, mild calcifications w/o AS, dilated IVC.   Recent hospitalization 8/1-11/2023 for dyspnea, orthopnea and LE edema and subsequently admitted for acute on chronic HFpEF. CXR consistent with pulmonary edema and mild pleural effusions. proBNP 2973. Diuresed with IV Lasix  and noted close to euvolemic on day of discharge with weight of 215 pounds. Hospital course complicated by hypoxia requiring O2 2L/min, underwent ambulatory O2 testing prior to discharge and did not require home O2. Also complicated by hypokalemia, which was resolved and AKI with Cr 1.53 >1.34. Continued on same medications as above.   Patient presented again to ED on 8/7 for SOB.  Workup without significant change from recent admission.  Repeat CXR continued to show  congestion and bilateral pleural effusion with no comment on changes, however based on my interpretation this CXR pleural effusion looks worse.  Discharged home with no changes.  Today, reports improvement with SOB, but still having SOB with minimal exertion and relieved with rest.  Notes no significant change in lower extremity edema, however patient also experiencing gout flareup as well.  Reports ongoing unchanged orthopnea for years, sleeping with bed elevated and 1 pillow.  Notes good urine output.  Weight in office today 205 pounds.  Denies CP, dizziness, syncope, palpitations. Reports compliance with medications.  Endorses healthy low-sodium diet. Denies tobacco use/alcohol/drug use    ROS: 10 point review of system has been reviewed and considered negative except ones been listed in the HPI.   Studies Reviewed: SABRA   EKG Interpretation Date/Time:  Wednesday February 09 2024 09:55:47 EDT Ventricular Rate:  77 PR Interval:    QRS Duration:  80 QT Interval:  390 QTC Calculation: 441 R Axis:   113  Text Interpretation: Atrial fibrillation Low voltage QRS Septal infarct (cited on or before 20-Jan-2024) Possible Lateral infarct (cited on or before 20-Jan-2024) When compared with ECG of 20-Jan-2024 17:57, QT has shortened Confirmed by Sheron Hallmark ([id number]) on 02/09/2024 10:17:56 AM   ECHO IMPRESSIONS 08/2023  1. Left ventricular ejection fraction, by estimation, is 60 to 65%. The  left ventricle has normal function. The left ventricle has no regional  wall motion abnormalities. There is mild concentric left ventricular  hypertrophy. Left ventricular diastolic  function could not be evaluated.   2. Right ventricular systolic function is normal. The  right ventricular  size is normal. There is moderately elevated pulmonary artery systolic  pressure. The estimated right ventricular systolic pressure is 54.2 mmHg.   3. Left atrial size was severely dilated.   4. Right atrial size was severely  dilated.   5. The mitral valve is normal in structure. Mild mitral valve  regurgitation. No evidence of mitral stenosis.   6. The aortic valve is tricuspid. There is mild calcification of the  aortic valve. Aortic valve regurgitation is trivial. Aortic valve  sclerosis/calcification is present, without any evidence of aortic  stenosis.   7. The inferior vena cava is dilated in size with <50% respiratory  variability, suggesting right atrial pressure of 15 mmHg.   Risk Assessment/Calculations:    CHA2DS2-VASc Score = 5  This indicates a 7.2% annual risk of stroke. The patient's score is based upon: CHF History: 1 HTN History: 1 Diabetes History: 0 Stroke History: 0 Vascular Disease History: 0 Age Score: 2 Gender Score: 1  Physical Exam:   VS:  BP (!) 104/58   Pulse (!) 57   Ht 5' 9 (1.753 m)   Wt 205 lb (93 kg)   SpO2 100%   BMI 30.27 kg/m    Wt Readings from Last 3 Encounters:  02/09/24 205 lb (93 kg)  02/07/24 196 lb (88.9 kg)  01/28/24 201 lb 6.4 oz (91.4 kg)    GEN: Well nourished, well developed in no acute distress while sitting in chair.  NECK: No JVD; No carotid bruits CARDIAC: irreg irreg, no murmurs, rubs, gallops RESPIRATORY:  diminished lung bases  ABDOMEN: Soft, non-tender, non-distended EXTREMITIES:  1-2 + pitting edema bilaterally; No deformity   ASSESSMENT AND PLAN: .   Chronic heart failure with preserved ejection fraction (HCC) EF 50-55% in 2019 and 45-50% in 2023 with most recent 60-65% in 08/2023 ECHO 08/2023: EF 60-65%, mild LVH, moderately PASP, mild MV regurgitations, trivial AI, mild calcifications w/o AS, dilated IVC.  Reports some improvement in SOB but still present, no changes in LE edema, and ongoing chronic unchanged orthopnea. Appears volume overloaded on exam with diminished lung bases and 1-2+ pitting edema, however weight decreased 215 to 205 lbs. Suspect possible baseline edema related to venous insufficiency or varicose veins if no  improvement in the future. 01/2024 K 4.4, Cr 1.34  Order proBNP, BMP, CXR, compression socks Based on labs, can decide between increasing torsemide  to 40 mg twice daily and/or metolazone  2 times per week 30 minutes before loop diuretic.  If CXR shows worsening pleural effusion then would recommend referral to IR for possible thoracentesis. Continue on Jardiance  10 mg daily, hydralazine  100 mg TID, Imdur 30mg  daily, KCL 20 MEQ daily, and Torsemide  20 mg BID.  Encouraged compression sock nightly, leg elevation, low sodium diet, fluid restriction <2L, and daily weights.  Educated to contact our office for weight gain of 2 lbs overnight or 5 lbs in one week. ED precautions discussed.    ADDENDUM 02/15/2024: Called to discuss the following with patent and Daughter: Electrolytes are normal. Kidney function is still elevated but has improved to 1.23. Fluid overload enzyme (BNP) was 272, which is lower than previous documented lab of 300's in 2023 but no recent BNP to compare. Patient notes significant improvement in SOB and LE edema swelling. Patient weight has been stable and decreasing with most recent weight of 201 lbs today. Since patient notes improvement in symptoms, would not recommend any med changes at this time. Encouraged to call office if symptoms worsen  or any significant changes in weight. Encouraged to follow up with compression stocking elevation and wear nightly. Continue with Torsemide  20 mg daily at this time. Follow up as scheduled on 03/08/2024.   Primary hypertension Reports well controlled Home SBP 110-120's Encouraged to continue to monitor home BP.  BP this OV well controlled today: 104/58. Denies any Iightheadness or dizziness.  Managed by GDMT as above.  Encourage physical activity for 150 minutes per week and heart healthy low sodium diet. Discussed limiting sodium intake to < 2 grams daily.     Permanent atrial fibrillation (HCC) ECHO 08/2023: EF 60-65%, severely dilated  LA/RA EKG today shows controlled Afib.  On exam noted irregularly irregular.  Denies palpitations or active bleeding.  On AC with CBC WNL on 01/2024 Continue on Pindolol  5 mg BID and Xarelto  15 mg daily (appropriate dose with CrCl as below). Estimated Creatinine Clearance: 32.5 mL/min (A) (by C-G formula based on SCr of 1.34 mg/dL (H)).   Hyperlipidemia, unspecified hyperlipidemia type Noted in history but no documented abnormal lipid panel and not on HLD medications.  Per patient, she is not aware of any history of HLD or being on statins. Will plan to assess fasting lipid panel ar next OV or when HF symptoms more stable.   Stage 3b chronic kidney disease (HCC) Recent hospitalization 01/2024: AKI with Cr 1.53 >1.34 (baseline 1.3-1.5) Order BMP     Dispo: Follow up in 3-4 weeks  Signed, Lorette CINDERELLA Kapur, PA-C

## 2024-02-09 ENCOUNTER — Ambulatory Visit (HOSPITAL_COMMUNITY)
Admission: RE | Admit: 2024-02-09 | Discharge: 2024-02-09 | Disposition: A | Discharge status: Home / Self Care | Source: Ambulatory Visit | Attending: Cardiology | Admitting: Cardiology

## 2024-02-09 ENCOUNTER — Ambulatory Visit: Attending: Cardiology | Admitting: Physician Assistant

## 2024-02-09 ENCOUNTER — Encounter: Payer: Self-pay | Admitting: Student

## 2024-02-09 VITALS — BP 104/58 | HR 57 | Ht 69.0 in | Wt 205.0 lb

## 2024-02-09 DIAGNOSIS — I10 Essential (primary) hypertension: Secondary | ICD-10-CM | POA: Diagnosis not present

## 2024-02-09 DIAGNOSIS — I509 Heart failure, unspecified: Secondary | ICD-10-CM | POA: Diagnosis not present

## 2024-02-09 DIAGNOSIS — E785 Hyperlipidemia, unspecified: Secondary | ICD-10-CM

## 2024-02-09 DIAGNOSIS — I5032 Chronic diastolic (congestive) heart failure: Secondary | ICD-10-CM | POA: Insufficient documentation

## 2024-02-09 DIAGNOSIS — I4821 Permanent atrial fibrillation: Secondary | ICD-10-CM | POA: Diagnosis not present

## 2024-02-09 DIAGNOSIS — R918 Other nonspecific abnormal finding of lung field: Secondary | ICD-10-CM | POA: Diagnosis not present

## 2024-02-09 DIAGNOSIS — R0989 Other specified symptoms and signs involving the circulatory and respiratory systems: Secondary | ICD-10-CM | POA: Diagnosis not present

## 2024-02-09 DIAGNOSIS — N1832 Chronic kidney disease, stage 3b: Secondary | ICD-10-CM | POA: Diagnosis not present

## 2024-02-09 DIAGNOSIS — J90 Pleural effusion, not elsewhere classified: Secondary | ICD-10-CM | POA: Diagnosis not present

## 2024-02-09 NOTE — Patient Instructions (Signed)
 Medication Instructions:  Your physician recommends that you continue on your current medications as directed. Please refer to the Current Medication list given to you today. *If you need a refill on your cardiac medications before your next appointment, please call your pharmacy*  Lab Work: Today at [facility]. BMET & BNP  If you have labs (blood work) drawn today and your tests are completely normal, you will receive your results only by: MyChart Message (if you have MyChart) OR A paper copy in the mail If you have any lab test that is abnormal or we need to change your treatment, we will call you to review the results.  Testing/Procedures: A chest x-ray takes a picture of the organs and structures inside the chest, including the heart, lungs, and blood vessels. This test can show several things, including, whether the heart is enlarges; whether fluid is building up in the lungs; and whether pacemaker / defibrillator leads are still in place.  Follow-Up: At [facility], you and your health needs are our priority.  As part of our continuing mission to provide you with exceptional heart care, our providers are all part of one team.  This team includes your primary Cardiologist (physician) and Advanced Practice Providers or APPs (Physician Assistants and Nurse Practitioners) who all work together to provide you with the care you need, when you need it.  Your next appointment:   4 week(s)  Provider:   One of our Advanced Practice Providers (APPs): Morse Clause, PA-C  Lamarr Satterfield, NP Miriam Shams, NP  Olivia Pavy, PA-C Josefa Beauvais, NP  Leontine Salen, PA-C Orren Fabry, PA-C  [place], PA-C Ernest Dick, NP  Damien Braver, NP Jon Hails, PA-C  Waddell Donath, PA-C    Dayna Dunn, PA-C  Scott Weaver, PA-C Lum Louis, NP Katlyn West, NP Callie Goodrich, PA-C  Evan Williams, PA-C Sheng Haley, PA-C  Xika Zhao, NP Kathleen Johnson, PA-C    We recommend signing up for  the patient portal called MyChart.  Sign up information is provided on this After Visit Summary.  MyChart is used to connect with patients for Virtual Visits (Telemedicine).  Patients are able to view lab/test results, encounter notes, upcoming appointments, etc.  Non-urgent messages can be sent to your provider as well.   To learn more about what you can do with MyChart, go to [email or web address].   Other Instructions

## 2024-02-10 ENCOUNTER — Ambulatory Visit: Payer: Self-pay | Admitting: Physician Assistant

## 2024-02-10 LAB — BASIC METABOLIC PANEL WITH GFR
BUN/Creatinine Ratio: 15 (ref 12–28)
BUN: 19 mg/dL (ref 10–36)
CO2: 21 mmol/L (ref 20–29)
Calcium: 8.7 mg/dL (ref 8.7–10.3)
Chloride: 100 mmol/L (ref 96–106)
Creatinine, Ser: 1.23 mg/dL — ABNORMAL HIGH (ref 0.57–1.00)
Glucose: 87 mg/dL (ref 70–99)
Potassium: 4.2 mmol/L (ref 3.5–5.2)
Sodium: 138 mmol/L (ref 134–144)
eGFR: 41 mL/min/1.73 — ABNORMAL LOW (ref >59)

## 2024-02-10 LAB — BRAIN NATRIURETIC PEPTIDE: BNP: 272.5 pg/mL — ABNORMAL HIGH (ref 0.0–100.0)

## 2024-02-15 ENCOUNTER — Other Ambulatory Visit: Payer: Self-pay

## 2024-02-15 DIAGNOSIS — R7303 Prediabetes: Secondary | ICD-10-CM | POA: Diagnosis not present

## 2024-02-15 DIAGNOSIS — M6281 Muscle weakness (generalized): Secondary | ICD-10-CM | POA: Diagnosis not present

## 2024-02-15 DIAGNOSIS — I130 Hypertensive heart and chronic kidney disease with heart failure and stage 1 through stage 4 chronic kidney disease, or unspecified chronic kidney disease: Secondary | ICD-10-CM | POA: Diagnosis not present

## 2024-02-15 DIAGNOSIS — I25118 Atherosclerotic heart disease of native coronary artery with other forms of angina pectoris: Secondary | ICD-10-CM | POA: Diagnosis not present

## 2024-02-15 DIAGNOSIS — Z7901 Long term (current) use of anticoagulants: Secondary | ICD-10-CM | POA: Diagnosis not present

## 2024-02-15 DIAGNOSIS — E782 Mixed hyperlipidemia: Secondary | ICD-10-CM | POA: Diagnosis not present

## 2024-02-15 DIAGNOSIS — I4821 Permanent atrial fibrillation: Secondary | ICD-10-CM | POA: Diagnosis not present

## 2024-02-15 DIAGNOSIS — N184 Chronic kidney disease, stage 4 (severe): Secondary | ICD-10-CM | POA: Diagnosis not present

## 2024-02-15 DIAGNOSIS — R5381 Other malaise: Secondary | ICD-10-CM | POA: Diagnosis not present

## 2024-02-15 DIAGNOSIS — I701 Atherosclerosis of renal artery: Secondary | ICD-10-CM | POA: Diagnosis not present

## 2024-02-15 DIAGNOSIS — I5033 Acute on chronic diastolic (congestive) heart failure: Secondary | ICD-10-CM | POA: Diagnosis not present

## 2024-02-15 NOTE — Patient Instructions (Signed)
 Visit Information  Thank you for taking time to visit with me today. Please don't hesitate to contact me if I can be of assistance to you before our next scheduled telephone appointment.  Our next appointment is by telephone on 02/22/24 at 1100 am  Following is a copy of your care plan:   Goals Addressed             This Visit's Progress    VBCI Transitions of Care (TOC) Care Plan       Problems:  Recent Hospitalization for treatment of CHF Knowledge Deficit Related to heart failure   Goal:  Over the next 30 days, the patient will not experience hospital readmission  Interventions:  Transitions of Care: Doctor Visits  - discussed the importance of doctor visits  Heart Failure Interventions: Provided education on low sodium diet Provided education about placing scale on hard, flat surface Advised patient to weigh each morning after emptying bladder Discussed importance of daily weight and advised patient to weigh and record daily Discussed the importance of keeping all appointments with provider Patient weight 201.8 lbs. No swelling or shortness of breath   Patient Self Care Activities:  Call pharmacy for medication refills 3-7 days in advance of running out of medications Call provider office for new concerns or questions  Notify RN Care Manager of TOC call rescheduling needs Participate in Transition of Care Program/Attend TOC scheduled calls Take medications as prescribed   call office if I gain more than 2 pounds in one day or 5 pounds in one week keep legs up while sitting track weight in diary use salt in moderation watch for swelling in feet, ankles and legs every day weigh myself daily begin a heart failure diary  Plan:  The patient has been provided with contact information for the care management team and has been advised to call with any health related questions or concerns.         The patient verbalized understanding of instructions, educational  materials, and care plan provided today and DECLINED offer to receive copy of patient instructions, educational materials, and care plan.   The patient has been provided with contact information for the care management team and has been advised to call with any health related questions or concerns.   Please call the care guide team at [phone] if you need to cancel or reschedule your appointment.   Please call the Suicide and Crisis Lifeline: 988 if you are experiencing a Mental Health or Behavioral Health Crisis or need someone to talk to.  Latona Krichbaum J. Chris Narasimhan RN, MSN [facility], [facility] RN Care Manager Direct Dial: [phone]  Fax: [phone] Website: [email or web address]

## 2024-02-15 NOTE — Transitions of Care (Post Inpatient/ED Visit) (Signed)
 Transition of Care week 4  Visit Note  02/15/2024  Name: Shannon [name] MRN: [id number]          DOB: [birth year]  Situation: Patient enrolled in [facility] 30-day program. Visit completed with daughter Hardin by telephone.   Background:   Initial Transition Care Management Follow-up Telephone Call    Past Medical History:  Diagnosis Date   Acute exacerbation of CHF (congestive heart failure) (HCC) 09/24/2017   Arthritis    all over   Atrial fibrillation (HCC)    Chronic diastolic CHF (congestive heart failure) (HCC) 09/24/2017   Chronic kidney disease    Dyspnea    Gout    Hypercholesteremia    Hypertension    Mitral regurgitation     Assessment: Patient Reported Symptoms: Cognitive Cognitive Status: Alert and oriented to person, place, and time, Normal speech and language skills      Neurological Neurological Review of Symptoms: No symptoms reported    HEENT HEENT Symptoms Reported: No symptoms reported      Cardiovascular Cardiovascular Symptoms Reported: No symptoms reported Weight: 201 lb 12.8 oz (91.5 kg) Cardiovascular Self-Management Outcome: 4 (good)  Respiratory      Endocrine Endocrine Symptoms Reported: No symptoms reported    Gastrointestinal Gastrointestinal Symptoms Reported: No symptoms reported      Genitourinary Genitourinary Symptoms Reported: Incontinence Genitourinary Management Strategies: Incontinence garment/pad Genitourinary Self-Management Outcome: 4 (good)  Integumentary Integumentary Symptoms Reported: No symptoms reported    Musculoskeletal Musculoskelatal Symptoms Reviewed: Weakness Additional Musculoskeletal Details: Uses walker-PT active weekly Musculoskeletal Management Strategies: Exercise Musculoskeletal Self-Management Outcome: 4 (good)      Psychosocial           There were no vitals filed for this visit.  Medications Reviewed Today     Reviewed by Amontae Ng, RN (Case Manager) on 02/15/24 at 1113  Med List  Status: <None>   Medication Order Taking? Sig Documenting Provider Last Dose Status Informant  empagliflozin  (JARDIANCE ) 10 MG TABS tablet [id number] Yes Take 1 tablet (10 mg total) by mouth daily. Ladona Heinz, MD  Active Self, Child  gabapentin  (NEURONTIN ) 100 MG capsule [id number] Yes Take 100 mg by mouth at bedtime. [provider]  Active Self, Child  hydrALAZINE  (APRESOLINE ) 100 MG tablet [id number] Yes Take 100 mg by mouth 3 (three) times daily. [provider]  Active Self, Child           Med Note ISA, CECILIA   Thu Mar 05, 2022 11:02 AM)    isosorbide  dinitrate (ISORDIL ) 30 MG tablet [id number] Yes TAKE 1 TABLET(30 MG) BY MOUTH THREE TIMES DAILY Ladona Heinz, MD  Active Self, Child  pindolol  (VISKEN ) 5 MG tablet [id number] Yes TAKE 1 TABLET(5 MG) BY MOUTH TWICE DAILY Ladona Heinz, MD  Active Self, Child           Med Note ISA, CECILIA   Thu Mar 05, 2022 11:03 AM)    Potassium Chloride  ER 20 MEQ TBCR [id number] Yes Take 1 tablet by mouth daily. [provider]  Active Self, Child  torsemide  (DEMADEX ) 20 MG tablet [id number] Yes TAKE 1 TABLET(20 MG) BY MOUTH TWICE DAILY AT 10 AM AND AT 4 PM Ladona Heinz, MD  Active Self, Child  Vitamin D, Ergocalciferol, (DRISDOL) 1.25 MG (50000 UNIT) CAPS capsule [id number] Yes Take 50,000 Units by mouth once a week. [provider]  Active Self, Child           Med Note (SATTERFIELD, TEENA BRAVO   Sun Jan 16, 2024  5:59 PM) No specific day   XARELTO  15 MG TABS tablet [id number] Yes TAKE 1 TABLET BY MOUTH EVERY EVENING AFTER JOAQUIN Ladona Heinz, MD  Active Self, Child           Med Note (SATTERFIELD, TEENA BRAVO   Sun Jan 16, 2024  6:00 PM) @1800             Goals Addressed             This Visit's Progress    VBCI Transitions of Care (TOC) Care Plan       Problems:  Recent Hospitalization for treatment of CHF Knowledge Deficit Related to heart failure   Goal:  Over the next 30 days, the patient will not experience  hospital readmission  Interventions:  Transitions of Care: Doctor Visits  - discussed the importance of doctor visits  Heart Failure Interventions: Provided education on low sodium diet Provided education about placing scale on hard, flat surface Advised patient to weigh each morning after emptying bladder Discussed importance of daily weight and advised patient to weigh and record daily Discussed the importance of keeping all appointments with provider Patient weight 201.8 lbs. No swelling or shortness of breath   Patient Self Care Activities:  Call pharmacy for medication refills 3-7 days in advance of running out of medications Call provider office for new concerns or questions  Notify RN Care Manager of TOC call rescheduling needs Participate in Transition of Care Program/Attend TOC scheduled calls Take medications as prescribed   call office if I gain more than 2 pounds in one day or 5 pounds in one week keep legs up while sitting track weight in diary use salt in moderation watch for swelling in feet, ankles and legs every day weigh myself daily begin a heart failure diary  Plan:  The patient has been provided with contact information for the care management team and has been advised to call with any health related questions or concerns.          Recommendation:   Continue Current Plan of Care  Follow Up Plan:   Telephone follow-up in 1 week  Nassim Cosma J. [place] Lapaglia RN, MSN [facility], [facility] RN Care Manager Direct Dial: [phone]  Fax: [phone] Website: [email or web address]

## 2024-02-16 DIAGNOSIS — I130 Hypertensive heart and chronic kidney disease with heart failure and stage 1 through stage 4 chronic kidney disease, or unspecified chronic kidney disease: Secondary | ICD-10-CM | POA: Diagnosis not present

## 2024-02-16 DIAGNOSIS — I5033 Acute on chronic diastolic (congestive) heart failure: Secondary | ICD-10-CM | POA: Diagnosis not present

## 2024-02-16 DIAGNOSIS — M6281 Muscle weakness (generalized): Secondary | ICD-10-CM | POA: Diagnosis not present

## 2024-02-16 DIAGNOSIS — I701 Atherosclerosis of renal artery: Secondary | ICD-10-CM | POA: Diagnosis not present

## 2024-02-16 DIAGNOSIS — E782 Mixed hyperlipidemia: Secondary | ICD-10-CM | POA: Diagnosis not present

## 2024-02-16 DIAGNOSIS — I25118 Atherosclerotic heart disease of native coronary artery with other forms of angina pectoris: Secondary | ICD-10-CM | POA: Diagnosis not present

## 2024-02-16 DIAGNOSIS — R5381 Other malaise: Secondary | ICD-10-CM | POA: Diagnosis not present

## 2024-02-16 DIAGNOSIS — N184 Chronic kidney disease, stage 4 (severe): Secondary | ICD-10-CM | POA: Diagnosis not present

## 2024-02-16 DIAGNOSIS — Z7901 Long term (current) use of anticoagulants: Secondary | ICD-10-CM | POA: Diagnosis not present

## 2024-02-16 DIAGNOSIS — R7303 Prediabetes: Secondary | ICD-10-CM | POA: Diagnosis not present

## 2024-02-16 DIAGNOSIS — I4821 Permanent atrial fibrillation: Secondary | ICD-10-CM | POA: Diagnosis not present

## 2024-02-17 DIAGNOSIS — Z7901 Long term (current) use of anticoagulants: Secondary | ICD-10-CM | POA: Diagnosis not present

## 2024-02-17 DIAGNOSIS — I25118 Atherosclerotic heart disease of native coronary artery with other forms of angina pectoris: Secondary | ICD-10-CM | POA: Diagnosis not present

## 2024-02-17 DIAGNOSIS — R5381 Other malaise: Secondary | ICD-10-CM | POA: Diagnosis not present

## 2024-02-17 DIAGNOSIS — I5033 Acute on chronic diastolic (congestive) heart failure: Secondary | ICD-10-CM | POA: Diagnosis not present

## 2024-02-17 DIAGNOSIS — E782 Mixed hyperlipidemia: Secondary | ICD-10-CM | POA: Diagnosis not present

## 2024-02-17 DIAGNOSIS — N184 Chronic kidney disease, stage 4 (severe): Secondary | ICD-10-CM | POA: Diagnosis not present

## 2024-02-17 DIAGNOSIS — I130 Hypertensive heart and chronic kidney disease with heart failure and stage 1 through stage 4 chronic kidney disease, or unspecified chronic kidney disease: Secondary | ICD-10-CM | POA: Diagnosis not present

## 2024-02-17 DIAGNOSIS — M6281 Muscle weakness (generalized): Secondary | ICD-10-CM | POA: Diagnosis not present

## 2024-02-17 DIAGNOSIS — I4821 Permanent atrial fibrillation: Secondary | ICD-10-CM | POA: Diagnosis not present

## 2024-02-17 DIAGNOSIS — R7303 Prediabetes: Secondary | ICD-10-CM | POA: Diagnosis not present

## 2024-02-17 DIAGNOSIS — I701 Atherosclerosis of renal artery: Secondary | ICD-10-CM | POA: Diagnosis not present

## 2024-02-22 ENCOUNTER — Other Ambulatory Visit: Payer: Self-pay

## 2024-02-22 NOTE — Transitions of Care (Post Inpatient/ED Visit) (Signed)
 Transition of Care Week 5  Visit Note  02/22/2024  Name: Shannon [name] MRN: [id number]          DOB: 02-04-1931  Situation: Patient enrolled in [facility] 30-day program. Visit completed with daughter Hardin by telephone.   Background:   Initial Transition Care Management Follow-up Telephone Call    Past Medical History:  Diagnosis Date   Acute exacerbation of CHF (congestive heart failure) (HCC) 09/24/2017   Arthritis    all over   Atrial fibrillation (HCC)    Chronic diastolic CHF (congestive heart failure) (HCC) 09/24/2017   Chronic kidney disease    Dyspnea    Gout    Hypercholesteremia    Hypertension    Mitral regurgitation     Assessment: Patient Reported Symptoms: Cognitive Cognitive Status: Alert and oriented to person, place, and time (Per daughter Benita)      Neurological Neurological Review of Symptoms: No symptoms reported    HEENT HEENT Symptoms Reported: No symptoms reported      Cardiovascular Cardiovascular Symptoms Reported: No symptoms reported Weight: 200 lb 3.2 oz (90.8 kg) Cardiovascular Self-Management Outcome: 5 (very good)  Respiratory Respiratory Symptoms Reported: No symptoms reported    Endocrine Endocrine Symptoms Reported: No symptoms reported    Gastrointestinal Gastrointestinal Symptoms Reported: No symptoms reported      Genitourinary Genitourinary Symptoms Reported: Incontinence Genitourinary Management Strategies: Incontinence garment/pad Genitourinary Self-Management Outcome: 4 (good)  Integumentary Integumentary Symptoms Reported: No symptoms reported    Musculoskeletal Musculoskelatal Symptoms Reviewed: Weakness Additional Musculoskeletal Details: Uses walker Musculoskeletal Management Strategies: Exercise Musculoskeletal Self-Management Outcome: 4 (good)      Psychosocial Psychosocial Symptoms Reported: Not assessed         There were no vitals filed for this visit.  Medications Reviewed Today     Reviewed by  Terrilee Dudzik, RN (Case Manager) on 02/22/24 at 1112  Med List Status: <None>   Medication Order Taking? Sig Documenting Provider Last Dose Status Informant  empagliflozin  (JARDIANCE ) 10 MG TABS tablet [id number] Yes Take 1 tablet (10 mg total) by mouth daily. Ladona Heinz, MD  Active Self, Child  gabapentin  (NEURONTIN ) 100 MG capsule [id number] Yes Take 100 mg by mouth at bedtime. [provider]  Active Self, Child  hydrALAZINE  (APRESOLINE ) 100 MG tablet [id number] Yes Take 100 mg by mouth 3 (three) times daily. [provider]  Active Self, Child           Med Note ISA, CECILIA   Thu Mar 05, 2022 11:02 AM)    isosorbide  dinitrate (ISORDIL ) 30 MG tablet [id number] Yes TAKE 1 TABLET(30 MG) BY MOUTH THREE TIMES DAILY Ladona Heinz, MD  Active Self, Child  pindolol  (VISKEN ) 5 MG tablet [id number] Yes TAKE 1 TABLET(5 MG) BY MOUTH TWICE DAILY Ladona Heinz, MD  Active Self, Child           Med Note ISA, CECILIA   Thu Mar 05, 2022 11:03 AM)    Potassium Chloride  ER 20 MEQ TBCR [id number] Yes Take 1 tablet by mouth daily. [provider]  Active Self, Child  torsemide  (DEMADEX ) 20 MG tablet [id number] Yes TAKE 1 TABLET(20 MG) BY MOUTH TWICE DAILY AT 10 AM AND AT 4 PM Ladona Heinz, MD  Active Self, Child  Vitamin D, Ergocalciferol, (DRISDOL) 1.25 MG (50000 UNIT) CAPS capsule [id number] Yes Take 50,000 Units by mouth once a week. [provider]  Active Self, Child           Med Note (SATTERFIELD, DARIUS E  Sun Jan 16, 2024  5:59 PM) No specific day   XARELTO  15 MG TABS tablet [id number] Yes TAKE 1 TABLET BY MOUTH EVERY EVENING AFTER JOAQUIN Ladona Heinz, MD  Active Self, Child           Med Note (SATTERFIELD, TEENA BRAVO   Sun Jan 16, 2024  6:00 PM) @1800             Goals Addressed             This Visit's Progress    COMPLETED: VBCI Transitions of Care (TOC) Care Plan       Problems:  Recent Hospitalization for treatment of CHF Knowledge Deficit Related to heart  failure   Goal:  Over the next 30 days, the patient will not experience hospital readmission  Interventions:  Transitions of Care: Doctor Visits  - discussed the importance of doctor visits  Heart Failure Interventions: Provided education on low sodium diet Provided education about placing scale on hard, flat surface Advised patient to weigh each morning after emptying bladder Discussed importance of daily weight and advised patient to weigh and record daily Discussed the importance of keeping all appointments with provider Patient weight 200.2 lbs. No swelling or shortness of breath   Patient Self Care Activities:  Call pharmacy for medication refills 3-7 days in advance of running out of medications Call provider office for new concerns or questions  Notify RN Care Manager of TOC call rescheduling needs Participate in Transition of Care Program/Attend TOC scheduled calls Take medications as prescribed   call office if I gain more than 2 pounds in one day or 5 pounds in one week keep legs up while sitting track weight in diary use salt in moderation watch for swelling in feet, ankles and legs every day weigh myself daily begin a heart failure diary  Plan:  The patient has been provided with contact information for the care management team and has been advised to call with any health related questions or concerns.         Recommendation:   Close to [facility] program. Patient meeting goals.   Recommended CCM for further care.  Daughter declined  Follow Up Plan:   Closing From:  Transitions of Care Program  Yahsir Wickens DOROTHA Seeds RN, MSN [place]  [facility], [facility] RN Care Manager Direct Dial: [phone]  Fax: [phone] Website: [email or web address]

## 2024-02-22 NOTE — Patient Instructions (Signed)
 Visit Information  Thank you for taking time to visit with me today. Please don't hesitate to contact me if I can be of assistance to you    Following is a copy of your care plan:   Goals Addressed             This Visit's Progress    COMPLETED: VBCI Transitions of Care (TOC) Care Plan       Problems:  Recent Hospitalization for treatment of CHF Knowledge Deficit Related to heart failure   Goal:  Over the next 30 days, the patient will not experience hospital readmission  Interventions:  Transitions of Care: Doctor Visits  - discussed the importance of doctor visits  Heart Failure Interventions: Provided education on low sodium diet Provided education about placing scale on hard, flat surface Advised patient to weigh each morning after emptying bladder Discussed importance of daily weight and advised patient to weigh and record daily Discussed the importance of keeping all appointments with provider Patient weight 200.2 lbs. No swelling or shortness of breath   Patient Self Care Activities:  Call pharmacy for medication refills 3-7 days in advance of running out of medications Call provider office for new concerns or questions  Notify RN Care Manager of TOC call rescheduling needs Participate in Transition of Care Program/Attend TOC scheduled calls Take medications as prescribed   call office if I gain more than 2 pounds in one day or 5 pounds in one week keep legs up while sitting track weight in diary use salt in moderation watch for swelling in feet, ankles and legs every day weigh myself daily begin a heart failure diary  Plan:  The patient has been provided with contact information for the care management team and has been advised to call with any health related questions or concerns.         The patient verbalized understanding of instructions, educational materials, and care plan provided today and DECLINED offer to receive copy of patient instructions,  educational materials, and care plan.   The patient has been provided with contact information for the care management team and has been advised to call with any health related questions or concerns.   Please call the care guide team at [phone] if you need to cancel or reschedule your appointment.   Please call the Suicide and Crisis Lifeline: 988 if you are experiencing a Mental Health or Behavioral Health Crisis or need someone to talk to.  Leanthony Rhett J. Mauro Arps RN, MSN [facility], [facility] RN Care Manager Direct Dial: [phone]  Fax: [phone] Website: [email or web address]

## 2024-02-23 DIAGNOSIS — I701 Atherosclerosis of renal artery: Secondary | ICD-10-CM | POA: Diagnosis not present

## 2024-02-23 DIAGNOSIS — I130 Hypertensive heart and chronic kidney disease with heart failure and stage 1 through stage 4 chronic kidney disease, or unspecified chronic kidney disease: Secondary | ICD-10-CM | POA: Diagnosis not present

## 2024-02-23 DIAGNOSIS — N184 Chronic kidney disease, stage 4 (severe): Secondary | ICD-10-CM | POA: Diagnosis not present

## 2024-02-23 DIAGNOSIS — Z7901 Long term (current) use of anticoagulants: Secondary | ICD-10-CM | POA: Diagnosis not present

## 2024-02-23 DIAGNOSIS — M6281 Muscle weakness (generalized): Secondary | ICD-10-CM | POA: Diagnosis not present

## 2024-02-23 DIAGNOSIS — E782 Mixed hyperlipidemia: Secondary | ICD-10-CM | POA: Diagnosis not present

## 2024-02-23 DIAGNOSIS — I25118 Atherosclerotic heart disease of native coronary artery with other forms of angina pectoris: Secondary | ICD-10-CM | POA: Diagnosis not present

## 2024-02-23 DIAGNOSIS — I4821 Permanent atrial fibrillation: Secondary | ICD-10-CM | POA: Diagnosis not present

## 2024-02-23 DIAGNOSIS — R7303 Prediabetes: Secondary | ICD-10-CM | POA: Diagnosis not present

## 2024-02-23 DIAGNOSIS — R5381 Other malaise: Secondary | ICD-10-CM | POA: Diagnosis not present

## 2024-02-23 DIAGNOSIS — I5033 Acute on chronic diastolic (congestive) heart failure: Secondary | ICD-10-CM | POA: Diagnosis not present

## 2024-02-24 DIAGNOSIS — Z7901 Long term (current) use of anticoagulants: Secondary | ICD-10-CM | POA: Diagnosis not present

## 2024-02-24 DIAGNOSIS — I25118 Atherosclerotic heart disease of native coronary artery with other forms of angina pectoris: Secondary | ICD-10-CM | POA: Diagnosis not present

## 2024-02-24 DIAGNOSIS — M6281 Muscle weakness (generalized): Secondary | ICD-10-CM | POA: Diagnosis not present

## 2024-02-24 DIAGNOSIS — E782 Mixed hyperlipidemia: Secondary | ICD-10-CM | POA: Diagnosis not present

## 2024-02-24 DIAGNOSIS — R5381 Other malaise: Secondary | ICD-10-CM | POA: Diagnosis not present

## 2024-02-24 DIAGNOSIS — I4821 Permanent atrial fibrillation: Secondary | ICD-10-CM | POA: Diagnosis not present

## 2024-02-24 DIAGNOSIS — R7303 Prediabetes: Secondary | ICD-10-CM | POA: Diagnosis not present

## 2024-02-24 DIAGNOSIS — N184 Chronic kidney disease, stage 4 (severe): Secondary | ICD-10-CM | POA: Diagnosis not present

## 2024-02-24 DIAGNOSIS — I130 Hypertensive heart and chronic kidney disease with heart failure and stage 1 through stage 4 chronic kidney disease, or unspecified chronic kidney disease: Secondary | ICD-10-CM | POA: Diagnosis not present

## 2024-02-24 DIAGNOSIS — I701 Atherosclerosis of renal artery: Secondary | ICD-10-CM | POA: Diagnosis not present

## 2024-02-24 DIAGNOSIS — I5033 Acute on chronic diastolic (congestive) heart failure: Secondary | ICD-10-CM | POA: Diagnosis not present

## 2024-02-29 DIAGNOSIS — R5381 Other malaise: Secondary | ICD-10-CM | POA: Diagnosis not present

## 2024-02-29 DIAGNOSIS — E782 Mixed hyperlipidemia: Secondary | ICD-10-CM | POA: Diagnosis not present

## 2024-02-29 DIAGNOSIS — N184 Chronic kidney disease, stage 4 (severe): Secondary | ICD-10-CM | POA: Diagnosis not present

## 2024-02-29 DIAGNOSIS — M6281 Muscle weakness (generalized): Secondary | ICD-10-CM | POA: Diagnosis not present

## 2024-02-29 DIAGNOSIS — I130 Hypertensive heart and chronic kidney disease with heart failure and stage 1 through stage 4 chronic kidney disease, or unspecified chronic kidney disease: Secondary | ICD-10-CM | POA: Diagnosis not present

## 2024-02-29 DIAGNOSIS — I5033 Acute on chronic diastolic (congestive) heart failure: Secondary | ICD-10-CM | POA: Diagnosis not present

## 2024-02-29 DIAGNOSIS — I25118 Atherosclerotic heart disease of native coronary artery with other forms of angina pectoris: Secondary | ICD-10-CM | POA: Diagnosis not present

## 2024-02-29 DIAGNOSIS — Z7901 Long term (current) use of anticoagulants: Secondary | ICD-10-CM | POA: Diagnosis not present

## 2024-02-29 DIAGNOSIS — I4821 Permanent atrial fibrillation: Secondary | ICD-10-CM | POA: Diagnosis not present

## 2024-02-29 DIAGNOSIS — R7303 Prediabetes: Secondary | ICD-10-CM | POA: Diagnosis not present

## 2024-02-29 DIAGNOSIS — I701 Atherosclerosis of renal artery: Secondary | ICD-10-CM | POA: Diagnosis not present

## 2024-03-01 DIAGNOSIS — M6281 Muscle weakness (generalized): Secondary | ICD-10-CM | POA: Diagnosis not present

## 2024-03-01 DIAGNOSIS — I4821 Permanent atrial fibrillation: Secondary | ICD-10-CM | POA: Diagnosis not present

## 2024-03-01 DIAGNOSIS — R5381 Other malaise: Secondary | ICD-10-CM | POA: Diagnosis not present

## 2024-03-01 DIAGNOSIS — I701 Atherosclerosis of renal artery: Secondary | ICD-10-CM | POA: Diagnosis not present

## 2024-03-01 DIAGNOSIS — I130 Hypertensive heart and chronic kidney disease with heart failure and stage 1 through stage 4 chronic kidney disease, or unspecified chronic kidney disease: Secondary | ICD-10-CM | POA: Diagnosis not present

## 2024-03-01 DIAGNOSIS — N184 Chronic kidney disease, stage 4 (severe): Secondary | ICD-10-CM | POA: Diagnosis not present

## 2024-03-01 DIAGNOSIS — E782 Mixed hyperlipidemia: Secondary | ICD-10-CM | POA: Diagnosis not present

## 2024-03-01 DIAGNOSIS — I5033 Acute on chronic diastolic (congestive) heart failure: Secondary | ICD-10-CM | POA: Diagnosis not present

## 2024-03-01 DIAGNOSIS — I25118 Atherosclerotic heart disease of native coronary artery with other forms of angina pectoris: Secondary | ICD-10-CM | POA: Diagnosis not present

## 2024-03-01 DIAGNOSIS — R7303 Prediabetes: Secondary | ICD-10-CM | POA: Diagnosis not present

## 2024-03-01 DIAGNOSIS — Z7901 Long term (current) use of anticoagulants: Secondary | ICD-10-CM | POA: Diagnosis not present

## 2024-03-02 DIAGNOSIS — R5381 Other malaise: Secondary | ICD-10-CM | POA: Diagnosis not present

## 2024-03-02 DIAGNOSIS — N184 Chronic kidney disease, stage 4 (severe): Secondary | ICD-10-CM | POA: Diagnosis not present

## 2024-03-02 DIAGNOSIS — I5033 Acute on chronic diastolic (congestive) heart failure: Secondary | ICD-10-CM | POA: Diagnosis not present

## 2024-03-02 DIAGNOSIS — I701 Atherosclerosis of renal artery: Secondary | ICD-10-CM | POA: Diagnosis not present

## 2024-03-02 DIAGNOSIS — Z7901 Long term (current) use of anticoagulants: Secondary | ICD-10-CM | POA: Diagnosis not present

## 2024-03-02 DIAGNOSIS — I130 Hypertensive heart and chronic kidney disease with heart failure and stage 1 through stage 4 chronic kidney disease, or unspecified chronic kidney disease: Secondary | ICD-10-CM | POA: Diagnosis not present

## 2024-03-02 DIAGNOSIS — M6281 Muscle weakness (generalized): Secondary | ICD-10-CM | POA: Diagnosis not present

## 2024-03-02 DIAGNOSIS — I25118 Atherosclerotic heart disease of native coronary artery with other forms of angina pectoris: Secondary | ICD-10-CM | POA: Diagnosis not present

## 2024-03-02 DIAGNOSIS — I4821 Permanent atrial fibrillation: Secondary | ICD-10-CM | POA: Diagnosis not present

## 2024-03-02 DIAGNOSIS — E782 Mixed hyperlipidemia: Secondary | ICD-10-CM | POA: Diagnosis not present

## 2024-03-02 DIAGNOSIS — R7303 Prediabetes: Secondary | ICD-10-CM | POA: Diagnosis not present

## 2024-03-07 NOTE — Progress Notes (Unsigned)
 Cardiology Office Note   Date:  03/08/2024  ID:  Hilliary, Jock [birth year], MRN [id number] PCP: Verdia Lombard, MD   HeartCare Providers Cardiologist:  Gordy Bergamo, MD    History of Present Illness Shannon [name] is a 89 y.o. female with a past medical history of permanent A-fib, hypertension, chronic HFpEF (EF 50 to 55% in 2019 and 45 to 50% in 2023 with most recent EF 60 to 65% on 08/2023), stage IIIa to be chronic kidney disease, hyperlipidemia, obesity, frequent PVCs by electrocardiogram and history of gout who reports today for follow-up appointment.  Was seen by our team 01/2024.  Had been followed by Dr. Bergamo after recent episode of fluid overload back in February of this year.  Reported improvement in lower extremity edema following a period of bed rest and diuretic management however some residual swelling in 1 leg.  Metolazone  was discontinued after 1 week to avoid renal dysfunction.  She was continued on Jardiance , hydralazine , Imdur, pindolol , and KCl, Xarelto , and furosemide .  Echo back in March of this year showed EF 65%, mild LVH,  severely dilated LA/RA, mild MV regurgitations, trivial AI, mild calcifications without AAS, dilated IVC.  Recent hospitalization August 1 through August 6 for dyspnea, orthopnea, lower extremity edema subsequently admitted for acute on chronic HFpEF.  CXR consistent for pulmonary edema and mild pleural effusions.  proBNP 2973.  Diuresed with IV Lasix  and noted close to euvolemic on the day of discharge and weight of 215 pounds.  Hospital course complicated by hypoxia requiring 2 L of oxygen.  Underwent ambulatory O2 testing prior to discharge and did not require home O2.  Also complicated by hypokalemia which was resolved.  AKI with creatinine 1.53 down to 1.34.  Presented again to the ED 8/7 for SOB.  Workup without significant change from recent admission.  Discharged home with no changes.  Was seen in follow-up by our team  shortly after still having SOB with minimal exertion and relieved with rest.  No significant change in lower extremity edema however patient also experiencing gout flares.  Has had unchanged orthopnea for years sleeping with bed elevated on 1 pillow.  Notes good urine output.  Weight in the office was 205 at that time.  Today, she has been doing well without any cardiovascular issues.  Weight has been decreasing.  She has not noticed any significant swelling in her lower legs.  We discussed her most recent labs including her BNP of 272.  She seems to be tolerating her recent diuretic regimen well without any significant symptoms.  We reviewed her most recent echocardiogram back in March and answered all questions.  Her LDL was 136 and triglycerides were 76.  We discussed initiating a very low-dose cholesterol medicine and patient politely declined at this time.  We can continue to keep an eye on her LDL on an annual basis.  From a rhythm standpoint, she has not experienced any palpitations and did not have any concerns with her atrial fibrillation today.  She is rate controlled and on Xarelto  for stroke prophylaxis  Reports no shortness of breath nor dyspnea on exertion. Reports no chest pain, pressure, or tightness. No edema, orthopnea, PND. Reports no palpitations.     ROS: Pertinent ROS in HPI  Studies Reviewed     Echo 08/19/23 IMPRESSIONS     1. Left ventricular ejection fraction, by estimation, is 60 to 65%. The  left ventricle has normal function. The left ventricle has no regional  wall motion abnormalities. There is mild concentric left ventricular  hypertrophy. Left ventricular diastolic  function could not be evaluated.   2. Right ventricular systolic function is normal. The right ventricular  size is normal. There is moderately elevated pulmonary artery systolic  pressure. The estimated right ventricular systolic pressure is 54.2 mmHg.   3. Left atrial size was severely dilated.    4. Right atrial size was severely dilated.   5. The mitral valve is normal in structure. Mild mitral valve  regurgitation. No evidence of mitral stenosis.   6. The aortic valve is tricuspid. There is mild calcification of the  aortic valve. Aortic valve regurgitation is trivial. Aortic valve  sclerosis/calcification is present, without any evidence of aortic  stenosis.   7. The inferior vena cava is dilated in size with <50% respiratory  variability, suggesting right atrial pressure of 15 mmHg.   Risk Assessment/Calculations  CHA2DS2-VASc Score = 5   This indicates a 7.2% annual risk of stroke. The patient's score is based upon: CHF History: 1 HTN History: 1 Diabetes History: 0 Stroke History: 0 Vascular Disease History: 0 Age Score: 2 Gender Score: 1            Physical Exam VS:  BP 118/62   Pulse 78   Ht 5' 7.5 (1.715 m)   Wt 203 lb 9.6 oz (92.4 kg)   SpO2 98%   BMI 31.42 kg/m        Wt Readings from Last 3 Encounters:  03/08/24 203 lb 9.6 oz (92.4 kg)  02/22/24 200 lb 3.2 oz (90.8 kg)  02/15/24 201 lb 12.8 oz (91.5 kg)    GEN: Well nourished, well developed in no acute distress NECK: No JVD; No carotid bruits CARDIAC: IRIR, no murmurs, rubs, gallops RESPIRATORY:  Clear to auscultation without rales, wheezing or rhonchi  ABDOMEN: Soft, non-tender, non-distended EXTREMITIES:  No edema; No deformity   ASSESSMENT AND PLAN HFpEF -(BNP) was 272, which is lower than previous documented lab of 300's in 2023 but no recent BNP to compare  -Would recommend continuing current medications including Jardiance  10 mg daily, hydralazine  100 mg 3 times daily, Imdur 30 mg daily, KCl 20 mEq daily, and torsemide  20 mg twice daily -Continue to monitor weight daily at the same time of day after using the bathroom before breakfast.  We discussed if she gains 2 to 3 pounds overnight or 5 pounds in a week she is to call our office and let us  know. -Continue a low-sodium, heart  healthy diet -Most recent echocardiogram from March reviewed which showed LVEF 65%, mild LVH, mild MV regurgitation, trivial AI, mild calcification without AAS, dilated IVC -Appears euvolemic on exam with only trace lower extremity edema, no shortness of breath noted  Hypertension -Blood pressure well-controlled today, 118/62 -Would continue current medication regimen -Low-sodium, heart healthy diet was discussed  Permanent atrial fibrillation -She has chronic rate controlled atrial fibrillation and is on chronic anticoagulation with Xarelto  -Rate controlled today and asymptomatic -Continue current medication regimen -Recent CBC showed that she had a normal hemoglobin of 12.8   Hyperlipidemia -LDL slightly above goal and triglycerides at goal -Discussed introducing a low-dose statin medication to further lower LDL -Patient politely declined at this time -We will continue to follow her LDL on annual basis  Stage IIIb chronic kidney disease -Recent lab work completed with serum creatinine at baseline (1.3-1.5)  - Continue to avoid all nephrotoxic medications -She is tolerating twice daily Demadex  without any side effects  Dispo: She can follow-up in 6 months to see Dr. Ladona  Signed, Orren LOISE Fabry, PA-C

## 2024-03-08 ENCOUNTER — Ambulatory Visit: Attending: Cardiology | Admitting: Physician Assistant

## 2024-03-08 ENCOUNTER — Encounter: Payer: Self-pay | Admitting: Physician Assistant

## 2024-03-08 VITALS — BP 118/62 | HR 78 | Ht 67.5 in | Wt 203.6 lb

## 2024-03-08 DIAGNOSIS — I5032 Chronic diastolic (congestive) heart failure: Secondary | ICD-10-CM | POA: Diagnosis not present

## 2024-03-08 DIAGNOSIS — N1832 Chronic kidney disease, stage 3b: Secondary | ICD-10-CM

## 2024-03-08 DIAGNOSIS — R600 Localized edema: Secondary | ICD-10-CM | POA: Diagnosis not present

## 2024-03-08 DIAGNOSIS — I10 Essential (primary) hypertension: Secondary | ICD-10-CM | POA: Diagnosis not present

## 2024-03-08 DIAGNOSIS — I4821 Permanent atrial fibrillation: Secondary | ICD-10-CM | POA: Diagnosis not present

## 2024-03-08 DIAGNOSIS — E785 Hyperlipidemia, unspecified: Secondary | ICD-10-CM

## 2024-03-08 DIAGNOSIS — I482 Chronic atrial fibrillation, unspecified: Secondary | ICD-10-CM

## 2024-03-08 DIAGNOSIS — I5033 Acute on chronic diastolic (congestive) heart failure: Secondary | ICD-10-CM | POA: Diagnosis not present

## 2024-03-08 MED ORDER — PINDOLOL 5 MG PO TABS: 5.0000 mg | ORAL_TABLET | Freq: Two times a day (BID) | ORAL | 3 refills | Status: AC

## 2024-03-08 MED ORDER — HYDRALAZINE HCL 100 MG PO TABS: 100.0000 mg | ORAL_TABLET | Freq: Three times a day (TID) | ORAL | 3 refills | Status: AC

## 2024-03-08 MED ORDER — RIVAROXABAN 15 MG PO TABS: 15.0000 mg | ORAL_TABLET | Freq: Every day | ORAL | 3 refills | Status: AC

## 2024-03-08 MED ORDER — ROSUVASTATIN CALCIUM 20 MG PO TABS: 20.0000 mg | ORAL_TABLET | Freq: Every day | ORAL | 3 refills | Status: AC

## 2024-03-08 MED ORDER — ISOSORBIDE DINITRATE 30 MG PO TABS: ORAL_TABLET | ORAL | 3 refills | Status: AC

## 2024-03-08 MED ORDER — EMPAGLIFLOZIN 10 MG PO TABS: 10.0000 mg | ORAL_TABLET | Freq: Every day | ORAL | 3 refills | Status: AC

## 2024-03-08 MED ORDER — TORSEMIDE 20 MG PO TABS: 20.0000 mg | ORAL_TABLET | Freq: Two times a day (BID) | ORAL | 3 refills | Status: AC

## 2024-03-08 NOTE — Patient Instructions (Signed)
 Medication Instructions:  Your physician recommends that you continue on your current medications as directed. Please refer to the Current Medication list given to you today.  *If you need a refill on your cardiac medications before your next appointment, please call your pharmacy*  Lab Work: None ordered  If you have labs (blood work) drawn today and your tests are completely normal, you will receive your results only by: MyChart Message (if you have MyChart) OR A paper copy in the mail If you have any lab test that is abnormal or we need to change your treatment, we will call you to review the results.  Testing/Procedures: None ordered  Follow-Up: At [facility], you and your health needs are our priority.  As part of our continuing mission to provide you with exceptional heart care, our providers are all part of one team.  This team includes your primary Cardiologist (physician) and Advanced Practice Providers or APPs (Physician Assistants and Nurse Practitioners) who all work together to provide you with the care you need, when you need it.  Your next appointment:   6 month(s)  Provider:   Gordy Bergamo, MD    We recommend signing up for the patient portal called MyChart.  Sign up information is provided on this After Visit Summary.  MyChart is used to connect with patients for Virtual Visits (Telemedicine).  Patients are able to view lab/test results, encounter notes, upcoming appointments, etc.  Non-urgent messages can be sent to your provider as well.   To learn more about what you can do with MyChart, go to [email or web address].   Other Instructions

## 2024-03-15 DIAGNOSIS — R5381 Other malaise: Secondary | ICD-10-CM | POA: Diagnosis not present

## 2024-03-15 DIAGNOSIS — I130 Hypertensive heart and chronic kidney disease with heart failure and stage 1 through stage 4 chronic kidney disease, or unspecified chronic kidney disease: Secondary | ICD-10-CM | POA: Diagnosis not present

## 2024-03-15 DIAGNOSIS — E782 Mixed hyperlipidemia: Secondary | ICD-10-CM | POA: Diagnosis not present

## 2024-03-15 DIAGNOSIS — I5033 Acute on chronic diastolic (congestive) heart failure: Secondary | ICD-10-CM | POA: Diagnosis not present

## 2024-03-15 DIAGNOSIS — Z7901 Long term (current) use of anticoagulants: Secondary | ICD-10-CM | POA: Diagnosis not present

## 2024-03-15 DIAGNOSIS — N184 Chronic kidney disease, stage 4 (severe): Secondary | ICD-10-CM | POA: Diagnosis not present

## 2024-03-15 DIAGNOSIS — R7303 Prediabetes: Secondary | ICD-10-CM | POA: Diagnosis not present

## 2024-03-15 DIAGNOSIS — I4821 Permanent atrial fibrillation: Secondary | ICD-10-CM | POA: Diagnosis not present

## 2024-03-15 DIAGNOSIS — I701 Atherosclerosis of renal artery: Secondary | ICD-10-CM | POA: Diagnosis not present

## 2024-03-15 DIAGNOSIS — M6281 Muscle weakness (generalized): Secondary | ICD-10-CM | POA: Diagnosis not present

## 2024-03-15 DIAGNOSIS — I25118 Atherosclerotic heart disease of native coronary artery with other forms of angina pectoris: Secondary | ICD-10-CM | POA: Diagnosis not present

## 2024-03-21 DIAGNOSIS — I4821 Permanent atrial fibrillation: Secondary | ICD-10-CM | POA: Diagnosis not present

## 2024-03-21 DIAGNOSIS — R5381 Other malaise: Secondary | ICD-10-CM | POA: Diagnosis not present

## 2024-03-21 DIAGNOSIS — Z7901 Long term (current) use of anticoagulants: Secondary | ICD-10-CM | POA: Diagnosis not present

## 2024-03-21 DIAGNOSIS — I5033 Acute on chronic diastolic (congestive) heart failure: Secondary | ICD-10-CM | POA: Diagnosis not present

## 2024-03-21 DIAGNOSIS — R7303 Prediabetes: Secondary | ICD-10-CM | POA: Diagnosis not present

## 2024-03-21 DIAGNOSIS — I25118 Atherosclerotic heart disease of native coronary artery with other forms of angina pectoris: Secondary | ICD-10-CM | POA: Diagnosis not present

## 2024-03-21 DIAGNOSIS — N184 Chronic kidney disease, stage 4 (severe): Secondary | ICD-10-CM | POA: Diagnosis not present

## 2024-03-21 DIAGNOSIS — I701 Atherosclerosis of renal artery: Secondary | ICD-10-CM | POA: Diagnosis not present

## 2024-03-21 DIAGNOSIS — I130 Hypertensive heart and chronic kidney disease with heart failure and stage 1 through stage 4 chronic kidney disease, or unspecified chronic kidney disease: Secondary | ICD-10-CM | POA: Diagnosis not present

## 2024-03-21 DIAGNOSIS — M6281 Muscle weakness (generalized): Secondary | ICD-10-CM | POA: Diagnosis not present

## 2024-03-21 DIAGNOSIS — E782 Mixed hyperlipidemia: Secondary | ICD-10-CM | POA: Diagnosis not present

## 2024-03-23 DIAGNOSIS — R7303 Prediabetes: Secondary | ICD-10-CM | POA: Diagnosis not present

## 2024-03-23 DIAGNOSIS — Z7901 Long term (current) use of anticoagulants: Secondary | ICD-10-CM | POA: Diagnosis not present

## 2024-03-23 DIAGNOSIS — I701 Atherosclerosis of renal artery: Secondary | ICD-10-CM | POA: Diagnosis not present

## 2024-03-23 DIAGNOSIS — I130 Hypertensive heart and chronic kidney disease with heart failure and stage 1 through stage 4 chronic kidney disease, or unspecified chronic kidney disease: Secondary | ICD-10-CM | POA: Diagnosis not present

## 2024-03-23 DIAGNOSIS — M6281 Muscle weakness (generalized): Secondary | ICD-10-CM | POA: Diagnosis not present

## 2024-03-23 DIAGNOSIS — I4821 Permanent atrial fibrillation: Secondary | ICD-10-CM | POA: Diagnosis not present

## 2024-03-23 DIAGNOSIS — E782 Mixed hyperlipidemia: Secondary | ICD-10-CM | POA: Diagnosis not present

## 2024-03-23 DIAGNOSIS — R5381 Other malaise: Secondary | ICD-10-CM | POA: Diagnosis not present

## 2024-03-23 DIAGNOSIS — I25118 Atherosclerotic heart disease of native coronary artery with other forms of angina pectoris: Secondary | ICD-10-CM | POA: Diagnosis not present

## 2024-03-23 DIAGNOSIS — N184 Chronic kidney disease, stage 4 (severe): Secondary | ICD-10-CM | POA: Diagnosis not present

## 2024-03-23 DIAGNOSIS — I5033 Acute on chronic diastolic (congestive) heart failure: Secondary | ICD-10-CM | POA: Diagnosis not present

## 2024-03-24 DIAGNOSIS — I701 Atherosclerosis of renal artery: Secondary | ICD-10-CM | POA: Diagnosis not present

## 2024-03-24 DIAGNOSIS — R7303 Prediabetes: Secondary | ICD-10-CM | POA: Diagnosis not present

## 2024-03-24 DIAGNOSIS — R5381 Other malaise: Secondary | ICD-10-CM | POA: Diagnosis not present

## 2024-03-24 DIAGNOSIS — E782 Mixed hyperlipidemia: Secondary | ICD-10-CM | POA: Diagnosis not present

## 2024-03-24 DIAGNOSIS — N184 Chronic kidney disease, stage 4 (severe): Secondary | ICD-10-CM | POA: Diagnosis not present

## 2024-03-24 DIAGNOSIS — I4821 Permanent atrial fibrillation: Secondary | ICD-10-CM | POA: Diagnosis not present

## 2024-03-24 DIAGNOSIS — I5033 Acute on chronic diastolic (congestive) heart failure: Secondary | ICD-10-CM | POA: Diagnosis not present

## 2024-03-24 DIAGNOSIS — Z7901 Long term (current) use of anticoagulants: Secondary | ICD-10-CM | POA: Diagnosis not present

## 2024-03-24 DIAGNOSIS — M6281 Muscle weakness (generalized): Secondary | ICD-10-CM | POA: Diagnosis not present

## 2024-03-24 DIAGNOSIS — I25118 Atherosclerotic heart disease of native coronary artery with other forms of angina pectoris: Secondary | ICD-10-CM | POA: Diagnosis not present

## 2024-03-24 DIAGNOSIS — I130 Hypertensive heart and chronic kidney disease with heart failure and stage 1 through stage 4 chronic kidney disease, or unspecified chronic kidney disease: Secondary | ICD-10-CM | POA: Diagnosis not present

## 2024-03-27 ENCOUNTER — Ambulatory Visit: Admitting: Physician Assistant

## 2024-04-03 DIAGNOSIS — N1832 Chronic kidney disease, stage 3b: Secondary | ICD-10-CM | POA: Diagnosis not present

## 2024-04-10 DIAGNOSIS — I129 Hypertensive chronic kidney disease with stage 1 through stage 4 chronic kidney disease, or unspecified chronic kidney disease: Secondary | ICD-10-CM | POA: Diagnosis not present

## 2024-04-10 DIAGNOSIS — D631 Anemia in chronic kidney disease: Secondary | ICD-10-CM | POA: Diagnosis not present

## 2024-04-10 DIAGNOSIS — N1832 Chronic kidney disease, stage 3b: Secondary | ICD-10-CM | POA: Diagnosis not present

## 2024-04-10 DIAGNOSIS — N2581 Secondary hyperparathyroidism of renal origin: Secondary | ICD-10-CM | POA: Diagnosis not present
